# Patient Record
Sex: Female | Born: 1975 | Race: Black or African American | Hispanic: No | Marital: Single | Smoking: Never smoker
Health system: Southern US, Community
[De-identification: ages and names within clinical notes are randomized; demographics above are authoritative.]

## PROBLEM LIST (undated history)

## (undated) DIAGNOSIS — IMO0001 Reserved for inherently not codable concepts without codable children: Secondary | ICD-10-CM

## (undated) DIAGNOSIS — D649 Anemia, unspecified: Secondary | ICD-10-CM

## (undated) DIAGNOSIS — D573 Sickle-cell trait: Secondary | ICD-10-CM

## (undated) DIAGNOSIS — R0602 Shortness of breath: Secondary | ICD-10-CM

## (undated) DIAGNOSIS — I2699 Other pulmonary embolism without acute cor pulmonale: Secondary | ICD-10-CM

## (undated) DIAGNOSIS — E669 Obesity, unspecified: Secondary | ICD-10-CM

## (undated) DIAGNOSIS — I10 Essential (primary) hypertension: Secondary | ICD-10-CM

## (undated) DIAGNOSIS — N186 End stage renal disease: Secondary | ICD-10-CM

## (undated) DIAGNOSIS — Z5189 Encounter for other specified aftercare: Secondary | ICD-10-CM

## (undated) HISTORY — PX: OTHER SURGICAL HISTORY: SHX169

## (undated) HISTORY — DX: Other pulmonary embolism without acute cor pulmonale: I26.99

## (undated) HISTORY — PX: FINGER AMPUTATION: SHX636

---

## 2002-08-20 ENCOUNTER — Emergency Department (HOSPITAL_COMMUNITY): Admission: EM | Admit: 2002-08-20 | Discharge: 2002-08-20 | Payer: Self-pay | Admitting: Emergency Medicine

## 2004-08-13 ENCOUNTER — Emergency Department (HOSPITAL_COMMUNITY): Admission: EM | Admit: 2004-08-13 | Discharge: 2004-08-13 | Payer: Self-pay | Admitting: Emergency Medicine

## 2004-08-13 ENCOUNTER — Inpatient Hospital Stay (HOSPITAL_COMMUNITY): Admission: EM | Admit: 2004-08-13 | Discharge: 2004-08-18 | Payer: Self-pay | Admitting: Emergency Medicine

## 2004-08-14 ENCOUNTER — Encounter (INDEPENDENT_AMBULATORY_CARE_PROVIDER_SITE_OTHER): Payer: Self-pay | Admitting: Cardiology

## 2004-11-22 ENCOUNTER — Ambulatory Visit (HOSPITAL_COMMUNITY): Admission: RE | Admit: 2004-11-22 | Discharge: 2004-11-22 | Payer: Self-pay | Admitting: *Deleted

## 2004-12-02 ENCOUNTER — Emergency Department (HOSPITAL_COMMUNITY): Admission: EM | Admit: 2004-12-02 | Discharge: 2004-12-02 | Payer: Self-pay | Admitting: Emergency Medicine

## 2005-01-11 ENCOUNTER — Emergency Department (HOSPITAL_COMMUNITY): Admission: EM | Admit: 2005-01-11 | Discharge: 2005-01-11 | Payer: Self-pay | Admitting: Emergency Medicine

## 2005-01-29 ENCOUNTER — Ambulatory Visit (HOSPITAL_COMMUNITY): Admission: RE | Admit: 2005-01-29 | Discharge: 2005-01-29 | Payer: Self-pay | Admitting: Vascular Surgery

## 2005-05-17 ENCOUNTER — Emergency Department (HOSPITAL_COMMUNITY): Admission: EM | Admit: 2005-05-17 | Discharge: 2005-05-17 | Payer: Self-pay | Admitting: *Deleted

## 2005-05-20 ENCOUNTER — Emergency Department (HOSPITAL_COMMUNITY): Admission: EM | Admit: 2005-05-20 | Discharge: 2005-05-20 | Payer: Self-pay | Admitting: *Deleted

## 2005-11-28 ENCOUNTER — Ambulatory Visit (HOSPITAL_COMMUNITY): Admission: RE | Admit: 2005-11-28 | Discharge: 2005-11-28 | Payer: Self-pay | Admitting: Nephrology

## 2005-12-16 ENCOUNTER — Ambulatory Visit (HOSPITAL_COMMUNITY): Admission: RE | Admit: 2005-12-16 | Discharge: 2005-12-16 | Payer: Self-pay | Admitting: Nephrology

## 2006-02-19 ENCOUNTER — Inpatient Hospital Stay (HOSPITAL_COMMUNITY): Admission: AC | Admit: 2006-02-19 | Discharge: 2006-02-27 | Payer: Self-pay

## 2006-05-26 ENCOUNTER — Encounter: Admission: RE | Admit: 2006-05-26 | Discharge: 2006-05-26 | Payer: Self-pay | Admitting: Nephrology

## 2006-08-07 ENCOUNTER — Encounter: Admission: RE | Admit: 2006-08-07 | Discharge: 2006-08-07 | Payer: Self-pay | Admitting: Nephrology

## 2006-09-12 ENCOUNTER — Ambulatory Visit: Payer: Self-pay | Admitting: Vascular Surgery

## 2007-03-06 ENCOUNTER — Ambulatory Visit: Payer: Self-pay | Admitting: Vascular Surgery

## 2007-03-17 ENCOUNTER — Ambulatory Visit (HOSPITAL_COMMUNITY): Admission: RE | Admit: 2007-03-17 | Discharge: 2007-03-17 | Payer: Self-pay | Admitting: Nephrology

## 2007-03-26 ENCOUNTER — Ambulatory Visit: Payer: Self-pay | Admitting: *Deleted

## 2007-03-31 ENCOUNTER — Ambulatory Visit: Payer: Self-pay | Admitting: *Deleted

## 2007-03-31 ENCOUNTER — Ambulatory Visit (HOSPITAL_COMMUNITY): Admission: RE | Admit: 2007-03-31 | Discharge: 2007-03-31 | Payer: Self-pay | Admitting: *Deleted

## 2007-04-23 ENCOUNTER — Ambulatory Visit: Payer: Self-pay | Admitting: *Deleted

## 2007-04-27 ENCOUNTER — Ambulatory Visit: Payer: Self-pay | Admitting: Vascular Surgery

## 2007-05-20 ENCOUNTER — Ambulatory Visit (HOSPITAL_COMMUNITY): Admission: RE | Admit: 2007-05-20 | Discharge: 2007-05-21 | Payer: Self-pay | Admitting: *Deleted

## 2007-05-20 ENCOUNTER — Encounter (INDEPENDENT_AMBULATORY_CARE_PROVIDER_SITE_OTHER): Payer: Self-pay | Admitting: *Deleted

## 2007-05-20 ENCOUNTER — Ambulatory Visit: Payer: Self-pay | Admitting: *Deleted

## 2007-05-28 ENCOUNTER — Ambulatory Visit: Payer: Self-pay | Admitting: *Deleted

## 2007-06-04 ENCOUNTER — Ambulatory Visit: Payer: Self-pay | Admitting: *Deleted

## 2007-09-15 ENCOUNTER — Encounter: Admission: RE | Admit: 2007-09-15 | Discharge: 2007-09-15 | Payer: Self-pay | Admitting: Nephrology

## 2007-10-06 ENCOUNTER — Ambulatory Visit (HOSPITAL_COMMUNITY): Admission: RE | Admit: 2007-10-06 | Discharge: 2007-10-06 | Payer: Self-pay | Admitting: Nephrology

## 2007-11-26 ENCOUNTER — Ambulatory Visit (HOSPITAL_COMMUNITY): Admission: RE | Admit: 2007-11-26 | Discharge: 2007-11-26 | Payer: Self-pay | Admitting: Nephrology

## 2007-12-08 ENCOUNTER — Ambulatory Visit: Payer: Self-pay | Admitting: Vascular Surgery

## 2008-01-05 ENCOUNTER — Ambulatory Visit (HOSPITAL_COMMUNITY): Admission: RE | Admit: 2008-01-05 | Discharge: 2008-01-05 | Payer: Self-pay | Admitting: Vascular Surgery

## 2008-05-18 ENCOUNTER — Ambulatory Visit: Payer: Self-pay | Admitting: Vascular Surgery

## 2008-05-18 ENCOUNTER — Ambulatory Visit (HOSPITAL_COMMUNITY): Admission: RE | Admit: 2008-05-18 | Discharge: 2008-05-18 | Payer: Self-pay | Admitting: Vascular Surgery

## 2008-10-27 ENCOUNTER — Ambulatory Visit: Payer: Self-pay | Admitting: *Deleted

## 2008-11-10 ENCOUNTER — Ambulatory Visit: Payer: Self-pay | Admitting: Vascular Surgery

## 2008-11-10 ENCOUNTER — Ambulatory Visit (HOSPITAL_COMMUNITY): Admission: RE | Admit: 2008-11-10 | Discharge: 2008-11-10 | Payer: Self-pay | Admitting: Vascular Surgery

## 2008-12-08 ENCOUNTER — Ambulatory Visit: Payer: Self-pay | Admitting: Vascular Surgery

## 2008-12-08 ENCOUNTER — Ambulatory Visit (HOSPITAL_COMMUNITY): Admission: RE | Admit: 2008-12-08 | Discharge: 2008-12-08 | Payer: Self-pay | Admitting: Vascular Surgery

## 2008-12-15 ENCOUNTER — Encounter: Admission: RE | Admit: 2008-12-15 | Discharge: 2008-12-15 | Payer: Self-pay | Admitting: Nephrology

## 2009-01-03 ENCOUNTER — Ambulatory Visit (HOSPITAL_COMMUNITY): Admission: RE | Admit: 2009-01-03 | Discharge: 2009-01-03 | Payer: Self-pay | Admitting: Nephrology

## 2009-02-23 ENCOUNTER — Ambulatory Visit (HOSPITAL_COMMUNITY): Admission: RE | Admit: 2009-02-23 | Discharge: 2009-02-23 | Payer: Self-pay | Admitting: Nephrology

## 2009-06-13 ENCOUNTER — Ambulatory Visit (HOSPITAL_COMMUNITY): Admission: RE | Admit: 2009-06-13 | Discharge: 2009-06-13 | Payer: Self-pay | Admitting: Nephrology

## 2009-12-25 ENCOUNTER — Ambulatory Visit: Payer: Self-pay | Admitting: Surgery

## 2010-03-05 ENCOUNTER — Ambulatory Visit (HOSPITAL_COMMUNITY): Admission: AD | Admit: 2010-03-05 | Discharge: 2010-03-05 | Payer: Self-pay | Admitting: Vascular Surgery

## 2010-03-05 ENCOUNTER — Ambulatory Visit: Payer: Self-pay | Admitting: Vascular Surgery

## 2010-03-12 ENCOUNTER — Ambulatory Visit: Payer: Self-pay | Admitting: Surgery

## 2010-04-10 ENCOUNTER — Ambulatory Visit (HOSPITAL_COMMUNITY)
Admission: RE | Admit: 2010-04-10 | Discharge: 2010-04-10 | Payer: Self-pay | Source: Home / Self Care | Admitting: Nephrology

## 2010-05-03 ENCOUNTER — Ambulatory Visit (HOSPITAL_COMMUNITY)
Admission: RE | Admit: 2010-05-03 | Discharge: 2010-05-03 | Payer: Self-pay | Source: Home / Self Care | Attending: Surgery | Admitting: Surgery

## 2010-05-07 ENCOUNTER — Ambulatory Visit (HOSPITAL_COMMUNITY)
Admission: AD | Admit: 2010-05-07 | Discharge: 2010-05-07 | Payer: Self-pay | Source: Home / Self Care | Attending: Surgery | Admitting: Surgery

## 2010-05-27 ENCOUNTER — Encounter: Payer: Self-pay | Admitting: Nephrology

## 2010-05-28 ENCOUNTER — Encounter: Payer: Self-pay | Admitting: Nephrology

## 2010-06-04 ENCOUNTER — Ambulatory Visit
Admission: RE | Admit: 2010-06-04 | Discharge: 2010-06-04 | Payer: Self-pay | Source: Home / Self Care | Attending: Surgery | Admitting: Surgery

## 2010-06-05 NOTE — Assessment & Plan Note (Signed)
OFFICE VISIT  Kristine Rios, Kristine Rios DOB:  [birth year]                                       06/04/2010 CHART#:[id number]  The patient comes back in today for followup.  She is status post left forearm AV Gore-Tex graft.  I had to perform patch angioplasty of a portion of her left brachial artery with bovine pericardium as I took out her AV fistula and used the arteriotomy site for the inflow to the graft.  I felt that the arteriotomy was too large and it needed to be closed down so this was done with a bovine patch.  She presented several days later with significant swelling in the arm.  I met her in the holding area thinking that I was going to have to remove the graft, that it had gotten infected, but rather this looked to me to be more like a Gore-Tex reaction.  Therefore I sent her home.  She has done very well. The swelling has gone away and her arm feels normal now.  I think at this time she can proceed with cannulation of her graft.    Jorge Ny, MD Electronically Signed  VWB/MEDQ  D:  06/04/2010  T:  06/05/2010  Job:  [id number]  cc:   Dr Darrick Penna

## 2010-06-15 ENCOUNTER — Encounter (HOSPITAL_COMMUNITY): Payer: Medicare Other | Attending: Vascular Surgery

## 2010-06-15 DIAGNOSIS — Z4901 Encounter for fitting and adjustment of extracorporeal dialysis catheter: Secondary | ICD-10-CM | POA: Insufficient documentation

## 2010-06-15 DIAGNOSIS — I120 Hypertensive chronic kidney disease with stage 5 chronic kidney disease or end stage renal disease: Secondary | ICD-10-CM

## 2010-06-15 DIAGNOSIS — Z452 Encounter for adjustment and management of vascular access device: Secondary | ICD-10-CM

## 2010-06-15 DIAGNOSIS — N186 End stage renal disease: Secondary | ICD-10-CM

## 2010-07-16 LAB — POCT I-STAT 4, (NA,K, GLUC, HGB,HCT)
HCT: 29 % — ABNORMAL LOW (ref 36.0–46.0)
Hemoglobin: 9.9 g/dL — ABNORMAL LOW (ref 12.0–15.0)
Potassium: 4.6 mEq/L (ref 3.5–5.1)
Potassium: 5.4 mEq/L — ABNORMAL HIGH (ref 3.5–5.1)
Sodium: 135 mEq/L (ref 135–145)

## 2010-07-16 LAB — SURGICAL PCR SCREEN: Staphylococcus aureus: NEGATIVE

## 2010-07-18 LAB — SURGICAL PCR SCREEN
MRSA, PCR: NEGATIVE
Staphylococcus aureus: NEGATIVE

## 2010-07-18 LAB — POCT I-STAT 4, (NA,K, GLUC, HGB,HCT)
Glucose, Bld: 96 mg/dL (ref 70–99)
HCT: 35 % — ABNORMAL LOW (ref 36.0–46.0)
Hemoglobin: 11.9 g/dL — ABNORMAL LOW (ref 12.0–15.0)

## 2010-07-19 ENCOUNTER — Other Ambulatory Visit (HOSPITAL_COMMUNITY): Payer: Self-pay | Admitting: Nephrology

## 2010-07-19 DIAGNOSIS — N186 End stage renal disease: Secondary | ICD-10-CM

## 2010-08-01 ENCOUNTER — Ambulatory Visit (HOSPITAL_COMMUNITY): Payer: Medicare Other | Attending: Nephrology

## 2010-08-09 LAB — CROSSMATCH
ABO/RH(D): O POS
Antibody Screen: NEGATIVE

## 2010-08-11 LAB — CROSSMATCH

## 2010-08-12 LAB — POCT I-STAT 4, (NA,K, GLUC, HGB,HCT): Potassium: 4.7 mEq/L (ref 3.5–5.1)

## 2010-09-18 NOTE — Op Note (Signed)
Kristine Rios, Kristine Rios                  ACCOUNT NO.:  [id number]   MEDICAL RECORD NO.:  [id number]          PATIENT TYPE:  AMB   LOCATION:  SDS                          FACILITY:  [facility]   PHYSICIAN:  Janetta Hora. Fields, MD  DATE OF BIRTH:  02/20/1976   DATE OF PROCEDURE:  03/31/2007  DATE OF DISCHARGE:                               OPERATIVE REPORT   PROCEDURE:  1. Ligation of left upper extremity arteriovenous fistula.  2. Ultrasound of neck.  3. Insertion of Diatek catheter.   PREOPERATIVE DIAGNOSES:  1. 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  2. End-stage renal disease.   POSTOPERATIVE DIAGNOSES:  1. 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  2. End-stage renal disease.   ANESTHESIA:  General.   ASSISTANT:  Billee Cashing, RNFA   OPERATIVE FINDINGS:  1. Large degenerated aneurysmal fistula.  2. 23 cm Diatek catheter, right internal jugular vein.   OPERATIVE DETAIL:  After obtaining informed consent, the patient taken  to the operating room.  The patient placed supine position on the  operating table.  After induction of general anesthesia and endotracheal  intubation, the patient's neck was inspected with an ultrasound.  Real  time ultrasound and duplex was used to identify the right internal  jugular vein.  It had normal compressibility and respiratory variation.  Next the patient's entire neck and chest were prepped and draped in  usual sterile fashion.  Local anesthesia was infiltrated over the right  internal jugular vein.  Then a small finder needle was used to localize  right internal jugular vein and a larger introducer needle used to  cannulate the right internal jugular vein and a 0.035 J-tip guide wire  threaded through the right internal jugular vein down into the right  atrium under fluoroscopic guidance.  Next sequential 12, 14 and 16-  [place] dilators with peel-away sheath placed over the guidewire in the  right atrium.   Guidewire and dilator was removed.  The 23-cm Diatek  catheter was then placed through the peel-away sheath in the right  atrium.  The peel-away sheath was then removed, the catheter tunneled  subcutaneously, cut to length and hub attached.  Catheter was inspected  under fluoroscopy, found with its tip to be in the right atrium, no  kinks throughout its course.  Catheters noted to flush and draw easily.  Catheter was sutured to skin with nylon sutures.  The neck insertion  site was closed with Vicryl stitch.  The patient tolerated this portion  of the procedure well and there were no complications.  Catheter was  loaded with concentrated heparin solution.   Next attention was turned to the patient's left upper extremity.  Entire  left upper extremity prepped and draped usual sterile fashion.  A  transverse incision was made through a preexisting scar near the  antecubital crease.  Incision carried down through subcutaneous tissues  down to level fistula.  Fistula was dissected free circumferentially.  Dissection was carried down to level of the arteriovenous anastomosis.  A Cooley clamp was then placed just adjacent to the arteriovenous  anastomosis.  Fistula was clamped distally with fistula clamp.  There  was a very large cuff of vein and this was oversewn with a running 5-0  Prolene suture to leave a small cuff on the artery.  The Cooley clamp  was then removed.  Hemostasis was obtained.  The distal end of the  fistula was then also oversewn with a running 5-0 Prolene suture.  The  clamp was then removed.  Everything was hemostatic.  The subcutaneous  tissues reapproximated using running 3-0 Vicryl suture.  Skin was closed  with 4-0 Vicryl subcuticular stitch.  The patient tolerate procedure  well and there were no complications.  Instrument, sponge, needle counts  correct at end of the case.  The patient taken to recovery room in  stable condition.  The patient had palpable radial  pulse at the end of  the case.      Janetta Hora. Fields, MD  Electronically Signed     CEF/MEDQ  D:  03/31/2007  T:  03/31/2007  Job:  [id number]
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
## 2010-09-18 NOTE — Op Note (Signed)
NAMEJASHAE, WIGGS                  ACCOUNT NO.:  [id number]   MEDICAL RECORD NO.:  [id number]          PATIENT TYPE:  AMB   LOCATION:  SDS                          FACILITY:  [facility]   PHYSICIAN:  Quita Skye. Hart Rochester, M.D.  DATE OF BIRTH:  1975-10-01   DATE OF PROCEDURE:  11/10/2008  DATE OF DISCHARGE:                               OPERATIVE REPORT   PREOPERATIVE DIAGNOSIS:  Poorly functioning right upper arm  arteriovenous fistula secondary to competing branches.   POSTOPERATIVE DIAGNOSIS:  Poorly functioning right upper arm  arteriovenous fistula secondary to competing branches.   OPERATION:  Ligation of competing branches, right upper arm AV fistula.   SURGEON:  Quita Skye. Hart Rochester, MD   FIRST ASSISTANT:  Nurse.   ANESTHESIA:  Local.   PROCEDURE:  The patient was taken to the operating room and placed in  supine position, at which time, the right upper extremity was prepped  with Betadine soap and solution, draped in routine sterile manner.  The  competing branches were in the midportion of the right upper arm and had  been marked after documenting this with the ultrasound in the OR prior  to prepping.  After infiltration with 1% Xylocaine with epinephrine, 2  short longitudinal incisions were made, 1 in the mid upper arm and 1 in  the more proximal upper arm over these prominent competing branches.  These were dissected free down to their entrance from the fistula and  ligated with 3-0 silk ties and divided.  Vein was examined beneath the  skin proximally and distally.  No other large branches were noted.  The  wounds were closed in layers with Vicryl in subcuticular fashion.  Sterile dressing applied.  The patient taken to recovery room in  satisfactory condition.      Quita Skye Hart Rochester, M.D.  Electronically Signed     JDL/MEDQ  D:  11/10/2008  T:  11/10/2008  Job:  [id number]

## 2010-09-18 NOTE — Assessment & Plan Note (Signed)
OFFICE VISIT   Kristine Rios, Kristine Rios  DOB:  09/06/1975                                       04/23/2007  CHART#:[id number]   The patient has had an extensively hypertrophied left upper arm AV  fistula ligated by Dr. Darrick Penna, along with placement of a Diatek catheter  on March 31, 2007.   At today's postoperative visit, there are no apparent complications.  Vital signs are stable.  Incision is well-healed in her left arm.   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   She would like to have these resected, along with placement of new  access.  I have arranged for surgery to be performed by myself on  05/20/2007 with dialysis scheduled for 05/19/2007.  We will plan to  resect left arm pseudo-aneurysms and potential right arm AV fistula  creation.  She will be scanned within 7 days of her procedure for vein  mapping.   Balinda Quails, M.D.  Electronically Signed   PGH/MEDQ  D:  04/23/2007  T:  04/24/2007  Job:  [id number]

## 2010-09-18 NOTE — Assessment & Plan Note (Signed)
OFFICE VISIT   Kristine Rios, Kristine Rios  DOB:  1975/06/30                                       12/25/2009  CHART#:[id number]   I am seeing Kristine Rios today at the request of Dr. Eliott Rios for evaluation  of her right upper arm fistula.  The patient has undergone a fistulogram  in February which shows extremely tortuous vein with luminal narrowing  near the cephalic subclavian junction.  She has not had any significant  change of flow rates at dialysis but they do have sometimes difficulty  running at her full speed.  She comes in today to discuss options for  repair or revision.   PHYSICAL EXAMINATION:  VITAL SIGNS:  Heart rate 80 blood pressure  105/76, respiratory rate 16.  She is well-appearing, in no distress.  Respirations are nonlabored.  The fistula is easily palpable in the  upper arm.   ASSESSMENT:  Poorly functioning right upper arm dialysis graft.   PLAN:  I think the patient's fistula is extremely tortuous with luminal  narrowing near the subclavian cephalic vein junction.  Due to the  location of the stenosis, I do not think it is able to be revised  surgically.  I would, however, recommend repeat fistulogram to see if  this area could be dilated with balloon venoplasty.  I will plan on  doing this next Tuesday, August 30 and I will plan on accessing her  fistula as high up on her arm as possible.  I discussed that this may or  may not be amenable to percutaneous intervention.  If I am unable to  successfully dilate this fistula, then it would become a fistula that is  not revisable.  I would use it until it can no longer be utilized and  the she will need to be converted to a graft.     Jorge Ny, MD  Electronically Signed   VWB/MEDQ  D:  12/25/2009  T:  12/26/2009  Job:  [id number]

## 2010-09-18 NOTE — Assessment & Plan Note (Signed)
OFFICE VISIT   SIAN, ROCKERS  DOB:  April 15, 1976                                       03/26/2007  OZHYQ#:[id number]   Nao is a 35 year old end-stage renal failure patient dialyzed at the  [facility] Monday, Wednesday and Friday.  She has a  history of hypertension leading to end-stage renal failure.   She has a left brachiocephalic arteriovenous fistula placed in September  of 2006. This has become extremely large and hypertrophied. No bleeding  associated with this. Denies significant pain. There is concern over how  large this has become, however, and recently this infiltrated.   PAST HISTORY:  Is significant for hypertension. She takes Norvasc and  Lisinopril for control of this. She remains poorly controlled.   35 year old African-American female in no acute distress.  Alert and  oriented. BP 175/116. Pulse 125 per minute. Respirations 18 per minute.   Left upper arm does reveal a large hypertrophied AV fistula with  evidence of some thinning of the skin. The fistula is patent. Recent  area of infiltration evident.   The best option at this time appears to be ligation of the fistula as  this has become quite large and hypertrophied.  Plan to perform this on  03/31/2007, at [facility] Place a Ditech catheter at that time  for temporary access.   Balinda Quails, M.D.  Electronically Signed   PGH/MEDQ  D:  03/26/2007  T:  03/27/2007  Job:  [id number]

## 2010-09-18 NOTE — Op Note (Signed)
Kristine Rios, Kristine Rios                  ACCOUNT NO.:  [id number]   MEDICAL RECORD NO.:  [id number]          PATIENT TYPE:  INP   LOCATION:  2550                         FACILITY:  [facility]   PHYSICIAN:  Balinda Quails, M.D.    DATE OF BIRTH:  08/08/1975   DATE OF PROCEDURE:  05/20/2007  DATE OF DISCHARGE:                               OPERATIVE REPORT   SURGEON:  Balinda Quails, MD   ASSISTANT:  Jerold Coombe, P.A.   ANESTHETIC:  General endotracheal.   ANESTHESIOLOGIST:  Kaylyn Layer. Michelle Piper, MD   PREOPERATIVE DIAGNOSES:  1. Thrombosed, painful left upper arm arteriovenous fistula with      multiple false aneurysms.  2. End-stage renal failure.   POSTOPERATIVE DIAGNOSES:  1. Thrombosed, painful left upper arm arteriovenous fistula with      multiple false aneurysms.  2. End-stage renal failure.   PROCEDURES:  1. Resection of multiple thrombosed false aneurysms, left upper arm.  2. Right brachiocephalic arteriovenous fistula [place] [employer]).   CLINICAL NOTE:  Kristine Rios is a 35 year old female with a history of  known end-stage renal disease.  She developed multiple false aneurysms  and a left brachiocephalic arteriovenous fistula requiring ligation and  has had a Diatek catheter placed.  She has significant symptoms in her  left arm of pain due to these thrombosed false aneurysms.  Brought to  the operating room at this time for planned resection of multiple false  aneurysms in the left upper arm arteriovenous fistula.  Also the plan at  this time is to create a right brachiocephalic arteriovenous fistula.   OPERATIVE PROCEDURE:  The patient brought to the operating room in  stable condition.  Placed in supine position.  General endotracheal  anesthesia induced.  The left arm prepped and draped in a sterile  fashion.   Three separate elliptical skin incisions were made encompassing three  distinct areas of thrombosed false aneurysm in the left upper arm.  Subcutaneous tissue divided  and the false aneurysms were resected from  the surrounding subcutaneous tissue.  This was carried out using a  combination of blunt and sharp dissection.  The majority the procedure  could be carried out bluntly, pushing the soft tissues off of the false  aneurysm.  These were due to a thrombosed, hypertrophied AV fistula.  These three segments of pseudoaneurysm were then all resected and  communicated with one another subcutaneously.  The resection was carried  up to the axillary fold and the vein divided at this level, where it was  chronically thrombosed.  Care was assured not to push any thrombus  proximally.   The patient continued to have a 2+ left radial pulse throughout the  operative procedure.  The subcutaneous tissue was then drained with a 19  round Blake drain, exited distally through the forearm and fixed to the  skin with a 2-0 silk suture.  The deep subcutaneous tissue was then  closed with interrupted 2-0 Vicryl suture.  The superficial subcutaneous  tissue closed with a running 3-0 Vicryl suture.  Skin closed with  interrupted 3-0  nylon in a simple stitch.  Sterile dressings of 4x4,  Kerlix and Ace wrap applied to the left arm.   Attention then placed on the right arm.  This was prepped and draped in  a sterile fashion.  Skin and subcutaneous tissue instilled with 1%  Xylocaine in the right antecubital fossa.  Transverse skin incision made  through the right antecubital fossa.  The right cephalic vein was  identified.  This was approximately 4 mm in size.  This was freed and  ligated distally with 3-0 silk and divided and easily dilated to 3 mm  without trauma.  Flushed with heparin-saline solution and controlled  with a bulldog clamp.  Deep dissection then carried down to expose the  brachial artery in the antecubital fossa.  This was dissected out, 4-5  mm in size.  Encircled with a vessel loop.  The patient administered  2000 units of heparin intravenously.    Brachial artery controlled proximally and distally with bulldog clamps.  A longitudinal arteriotomy made.  The vein was then anastomosed end-to-  side to the brachial artery using running 7-0 Prolene suture.  The vein  and artery flushed.  Clamps were removed.  Excellent flow present  through the fistula.  No apparent complications.  Adequate hemostasis  obtained.   Subcutaneous tissue closed with running 3-0 Vicryl suture.  Skin closed  with 4-0 Monocryl.  Dermabond applied.  No apparent complications.  The  patient transferred to the recovery room in stable condition.      Balinda Quails, M.D.  Electronically Signed     PGH/MEDQ  D:  05/20/2007  T:  05/20/2007  Job:  [id number]

## 2010-09-18 NOTE — Assessment & Plan Note (Signed)
OFFICE VISIT   Kristine Rios, Kristine Rios  DOB:  1976-04-10                                       10/27/2008  ZOXWR#:[id number]   The patient is an end-stage renal failure patient hemodialysis Monday,  Wednesday, Friday.  She has a history of a right upper arm arteriovenous  fistula, brachial cephalic created by myself 05/20/2007.  She was most  recently seen by Dr. Edilia Bo in the office for evaluation of the fistula  and he had planned to carry out a ligation of two large competing  branches which were evident on a fistulogram dated 11/26/2007.  This  surgery was scheduled for 12/24/2007, however, the patient never kept  her appointment.  She returns at this time now wishing to have this  revision carried out.   On evaluation she is an obese 35 year old African American female.  Blood pressure 108/72, pulse is 86.  Right arm reveals a patent brachial  cephalic AV fistula.  The competing veins are fairly obvious on the  surface of the skin.   I have arranged for ligation of competing branches to be carried out by  Dr. Edilia Bo on 11/03/2008 at [facility].   Balinda Quails, M.D.  Electronically Signed   PGH/MEDQ  D:  10/27/2008  T:  10/28/2008  Job:  [id number]

## 2010-09-18 NOTE — Procedures (Signed)
CEPHALIC VEIN MAPPING   INDICATION:  ESRD.   HISTORY:  Failed left upper extremity AVF.   EXAM:  Right upper extremity cephalic vein map.   The right cephalic vein is compressible.   Diameter measurements range from 0.20 cm to 0.22 cm on the forearm and  in the upper arm from 0.25 cm to 0.43 cm.   The left cephalic vein was not evaluated.   See attached worksheet for all measurements.   IMPRESSION:  Patent right cephalic vein which is of acceptable diameter  for use as a dialysis access site in the upper arm and appearing of  smaller diameter in the forearm.   ___________________________________________  P. Liliane Bade, M.D.   AS/MEDQ  D:  04/27/2007  T:  04/28/2007  Job:  [id number]

## 2010-09-18 NOTE — Assessment & Plan Note (Signed)
OFFICE VISIT   KEYON, WINNICK  DOB:  29-Nov-1975                                       12/08/2007  CHART#:[id number]   I saw the patient for evaluation of her right upper arm AV fistula.  She  had the right upper arm AV fistula placed earlier this year and it has  been difficult to access.  Therefore she currently has a functioning  Diatek catheter.  She underwent a fistulogram on 07/23 2009, which  showed multiple competing branches in her fistula and no options from an  endovascular standpoint.  She was sent for vascular consultation.   I have reviewed the films and I see no problems with the arterial  anastomosis.  The fistula appears to be anastomosed to the brachial  artery just below the antecubital level.  There is a slight kink near  the proximal anastomosis but this does not appear to be limiting flow.  Beyond this, the vein becomes somewhat tortuous.  In the mid segment of  the fistula there are 2 very large competing branches that supply  multiple collaterals.  The fistula above this is patent and the cephalic  vein is patent where it enters the subclavian vein with no significant  narrowing noted.   EXAMINATION:  This is a pleasant 35 year old woman who appears her  stated age.  The fistula has a good thrill proximally.  Multiple venous  collaterals are noted.   I think the best chance for salvage of the fistula would be to ligate  the two large competing branches.  I explained that there is no  guarantee that this will solve the problem.  However, I think it is  worth 1 last try to salvage this fistula before going to placement of an  AV graft.  Her surgery has been scheduled for 12/24/2007.   Di Kindle. Edilia Bo, M.D.  Electronically Signed   CSD/MEDQ  D:  12/08/2007  T:  12/09/2007  Job:  [id number]

## 2010-09-18 NOTE — Assessment & Plan Note (Signed)
OFFICE VISIT   BIANCE, MONCRIEF  DOB:  [birth year]                                       03/06/2007  EAVWU#:[id number]   Agnes Brightbill presents today have been sent over from the kidney center.  She is a 35 year old black female with end-stage renal disease.  She has  a left upper arm AV fistula that was created by Dr. Cari Caraway 2  years ago in September 2006.  She has had massive dilatation of this  over the 2 years subsequent.  She has had good functioning of this.  She  was seen by Dr. Fabienne Bruns in our office in May 2008 for evaluation  of the large aneurysmal dilatation.  She did have a shuntogram of this  in 11/2005, and I have reviewed these films.  She has had multiple  attempts of rescheduling a shuntogram, and she has not made these  appointments.  She presented today with concern regarding her access.  The dialysis staff had been instructed to stick above the area where  they were typically sticking the fistula, and they had a return of old  blood.   PHYSICAL EXAMINATION:  She has some massive enlargement of her cephalic  vein occupying her entire left lateral upper arm.  This is patent.  There is no erythema.  No skin breakdown.  No evidence of impending  rupture.  There is no evidence of infection.  The dialysis staff had  accessed this above the usual area, in the mid to upper arm.  I removed  the dressing from this and there is a clean stick.  She had had an old  hematoma in that area, and I suspect that what the access center had  done was access the hematoma with old clotted blood present.  I  explained to Ms. Angello the importance of obtaining a shuntogram.  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  This has been scheduled for  next week, and she was encouraged to keep this.  I feel it is totally  safe to continue to access the fistula  during this evaluation time.  If she has uncorrectable upper arm central  pathology with stenosis, then she require a new graft and a right arm  access.   Larina Earthly, M.D.  Electronically Signed   TFE/MEDQ  D:  03/06/2007  T:  03/09/2007  Job:  [id number]   cc:   Fayrene Fearing L. Deterding, M.D.

## 2010-09-18 NOTE — Assessment & Plan Note (Signed)
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   She presents today for postoperative scheduled visit.  Her pulse is 90  per minute, respirations 18 per minute.  O2 saturation is 97%.  Incisions in the left arm are healing well.  The drain is still in  place.  This will be removed today.  She has 2+ left radial pulse.   Her right Carlos American fistula is patent with an easily palpable thrill.   We will plan to have sutures removed next week in the office.  Drain  will be removed today.  Overall, doing well.   Balinda Quails, M.D.  Electronically Signed   PGH/MEDQ  D:  05/28/2007  T:  05/29/2007  Job:  [id number]

## 2010-09-21 NOTE — Discharge Summary (Signed)
Kristine Rios, Kristine Rios                  ACCOUNT NO.:  [id number]   MEDICAL RECORD NO.:  [id number]          PATIENT TYPE:  INP   LOCATION:  5506                         FACILITY:  [facility]   PHYSICIAN:  Franchot Mimes, MD      DATE OF BIRTH:  May 06, 1976   DATE OF ADMISSION:  08/13/2004  DATE OF DISCHARGE:  08/18/2004                                 DISCHARGE SUMMARY   DISCHARGE DIAGNOSES:  1.  End-stage renal disease of unknown etiology.  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  1.  Anemia secondary to end-stage renal disease.  2.  Hyperparathyroidism secondary to end-stage renal disease.  3.  Hypocalcemia and hyperphosphatemia, now resolved.   DISCHARGE MEDICATIONS:  1.  Nephro-Vite one daily.  2.  OS-Cal 1000 mg p.o. with each meal.  3.  Epogen 15,000 units IV with each dialysis.  4.  Zemplar 6 mcg IV with each dialysis.  5.  Percocet 1-2 tablets every six hours as needed for pain, #15 provided.   DISCHARGE INSTRUCTIONS:  The patient is instructed to follow an appropriate  low sodium/low potassium renal diet.   FOLLOWUP:  She is to continue her dialysis on Monday, Wednesday and Friday  at 11:45 a.m. at the [facility].   PROCEDURES:  1.  Insertion of Diatek catheter performed by CVTS on August 14, 2004.  2.  Left AV fistula performed by CVTS on August 17, 2004.  3.  Echocardiogram done on August 14, 2004.  Results:  Left ventricle is mildly dilated, ejection fraction is estimated  at 50-55%, left ventricular wall thickness is moderately increased, there is  mild mitral valvular regurgitation, the left atrium is moderately dilated,  and there is a very minimal pericardial effusion without hemodynamic  compromise.  1.  Renal ultrasound performed August 13, 2004.  Results:  Echogenic kidneys which are small for the patients age, there  is a  hypoechoic lesion representing a complex cyst in the lower pole of the left  kidney, this is most suspicious for medical renal disease potentially  chronic.   HISTORY OF PRESENT ILLNESS:  Kristine Rios is a 35 year old African-American  female with a family history of kidney disease in two aunts who have been on  dialysis.  She reported to the [facility] Emergency Room with complaints of  edema.  She was found to have a creatinine of 23.  It turns out she had also  been having daily nausea, anorexia, fatigue, as well as this edema that had  been going on for some time now.  She had no other significant past medical  history.   PHYSICAL EXAMINATION:  GENERAL:  She is an obese African-American female in  no apparent distress.  She was awake, alert and oriented x3 and her mood and  affect are appropriate.  HEENT:  Normal.  CHEST:  No crackles, normal effort.  CARDIOVASCULAR:  Regular rate, grade 2/6 systolic murmur, there was question  of a pericardial rub, however as her heart rate slowed down this was not the  case.  ABDOMEN:  Soft, nontender, nondistended, no organomegaly, no masses.  EXTREMITIES:  +4 pitting edema to the level above the sacrum, there is no  skin breakdown.  NEUROLOGIC:  Nonfocal.   HOSPITAL COURSE:  1.  End-stage renal disease:  Given the results of the renal ultrasound, it      became apparent that this was due to chronic kidney disease that had      been going on for some time and the patient was in need of dialysis      soon, and as dialysis was initiated, after insertion of the Diatek      catheter.  The patient underwent four series of dialysis as an      inpatient.  She tolerated dialysis well.  Her weight at the end of her      last dialysis was 132.7, and they were able to take 5000 liters out.      Her blood pressure at the end of this dialysis was 123/88.  2.  Anemia:  The patients initial hemoglobin was 7.1, she then became lower      at 7.5, was  given two units, started on Epogen.  She responded well to      these two units and her hemoglobin was actually going up.  By the time      she left the decision was made to not give additional packed red blood      cells, her discharge hemoglobin was 9.1.  3.  Hyperparathyroidism:  As stated her PTH was found to be 1640 and she had      very low calcium and high phosphorus.  She was started on Zemplar, this      will need continued monitoring at least every three months and she may      be a candidate for a thyroidectomy.  4.  Hypertension:  The patient came in very hypertensive, this continued but      improved throughout her hospital course.  As stated above, at the end of      her last dialysis her blood pressure was 123/88, she has significant      edema that remains and will need a fair amount more fluid taken off and      I believe her hypertension will correct as her estimated dry weight is      achieved.  5.  Hypocalcemia and hyperphosphatemia:  The patients phosphate was      initially 10.7, it was 3.6 at time of discharge, as this corrected with      dialysis.  She is now on calcium.  Her calcium at the time of discharge      was 7.2.   ADDITIONAL LAB DATA:  Her most pertinent labs from discharge are as follows:  PTH 1640, iron percent sats 44, hepatitis B surface antigen negative on  August 13, 2004, white blood cell count 8, hemoglobin 9.1, platelets 139,  renal profile shows a sodium of 140, potassium 3.0, chloride 107, bicarb 25,  BUN 24, creatinine 11.3, glucose 92, calcium 7.2, phosphorus 3.6.   Additional labs performed in the hospital reveal a negative acute hepatitis  panel, negative ANA, a CD4 count of 620, sed rate of 88, B12 level greater  than 2000, TSH 5.2, ASOs were negative, folate of 3.8, serum protein  electrophoresis revealed nonspecific diffuse polyclonal increase in gamma  globulins.  SUGGESTED FOLLOWUP ITEMS:  The patient despite her end-stage renal  disease  and obesity is otherwise in fairly good health.  She may be a good candidate  for kidney transplant in the future if she is able to tolerate dialysis and  do well with her chronic disease.      TV/MEDQ  D:  08/18/2004  T:  08/18/2004  Job:  [id number]   cc:   [facility]   [facility]

## 2010-09-21 NOTE — Op Note (Signed)
NAMEZAELYN, NOACK                  ACCOUNT NO.:  [id number]   MEDICAL RECORD NO.:  [id number]          PATIENT TYPE:  AMB   LOCATION:  SDS                          FACILITY:  [facility]   PHYSICIAN:  Di Kindle. Edilia Bo, M.D.DATE OF BIRTH:  05-13-1975   DATE OF PROCEDURE:  01/29/2005  DATE OF DISCHARGE:  01/29/2005                                 OPERATIVE REPORT   PREOPERATIVE DIAGNOSIS:  Chronic renal failure.   POSTOPERATIVE DIAGNOSIS:  Chronic renal failure.   PROCEDURE:  Placement of new left upper arm arteriovenous fistula.   SURGEON:  Di Kindle. Edilia Bo, M.D.   ASSISTANT:  Nurse.   ANESTHESIA:  Local with sedation.   TECHNIQUE:  The patient was taken to the operating room and sedated by  Anesthesia.  The left upper extremity was prepped and draped in the usual  sterile fashion.  After the skin was infiltrated with 1% lidocaine, a  transverse incision was made just above the antecubital level and here the  cephalic vein was dissected free.  It was a reasonable-sized vein.  One  lateral branch was ligated between 2-0 silk ties.  The brachial artery was  dissected free beneath the fascia; it did spasm down some, but was a good  reasonable-sized artery and had a good pulse.  The patient was heparinized.  The vein had been distended up with heparinized saline and easily took a 4-  mm dilator.  The artery was clamped proximally and distally and a  longitudinal arteriotomy was made.  The vein was mobilized over and sewn end-  to-side to the artery using continuous 6-0 Prolene suture.  At the  completion, there was an excellent thrill in the fistula.  There was a  palpable radial pulse.  Hemostasis was obtained in the wound and the wound  was closed with a deep layer of 3-0 Vicryl and the skin closed with 4-0  Vicryl.  A sterile dressing was applied.  The patient tolerated the  procedure well and was transferred to the recovery room in satisfactory  condition.  All needle and  sponge counts were correct.      Di Kindle. Edilia Bo, M.D.  Electronically Signed     CSD/MEDQ  D:  01/29/2005  T:  01/30/2005  Job:  [id number]

## 2010-09-21 NOTE — Op Note (Signed)
[name], Kristine Rios                  ACCOUNT NO.:  [id number]   MEDICAL RECORD NO.:  [id number]          PATIENT TYPE:  INP   LOCATION:  5506                         FACILITY:  [facility]   PHYSICIAN:  Balinda Quails, M.D.    DATE OF BIRTH:  1975-08-24   DATE OF PROCEDURE:  08/17/2004  DATE OF DISCHARGE:                                 OPERATIVE REPORT   PREOPERATIVE DIAGNOSIS:  End stage renal failure.   POSTOPERATIVE DIAGNOSIS:  End stage renal failure.   OPERATION PERFORMED:  Left Cimino arteriovenous fistula.   SURGEON:  Balinda Quails, M.D.   ASSISTANT:  Nurse.   ANESTHESIA:  Local with MAC.   ANESTHESIOLOGISTJean Rosenthal.   DESCRIPTION OF PROCEDURE:  The patient was brought to the operating room in  stable condition.  Placed in supine position.  Left arm prepped and draped  in sterile fashion.  Skin and subcutaneous tissue instilled with 1%  Xylocaine with epinephrine.  Longitudinal skin incision made over the left  anatomical snuff box.  Carried down to expose the cephalic vein.  This was  freed.  3 to 4 mm in size.  The vein encircled with a vessel loop.  Tributaries ligated with 4-0 silk and divided.  The vein then ligated  distally with 3-0 silk and divided.  Dilated with papaverine.  This was 3 to  4 mm in size.  Deep dissection then carried down to the radial artery.  Tributaries of the radial artery controlled with 4-0 silk and divided.  The  radial artery adequately mobilized.  The patient administered 3000 units of  heparin intravenously.  The artery then controlled with serrefine clamps.  Longitudinal arteriotomy was made.  The vein was divided and anastomosis end-  to-side to the radial artery using running 7-0 Prolene suture.  Clamps were  then removed.  Excellent flow present through the vein.  Adequate hemostasis  obtained.  Sponge and instrument counts were correct.  Subcutaneous tissue  closed with running 3-0 Vicryl suture.  Skin closed with 4-0 Monocryl.  Steri-Strips applied.  There were no apparent complications.  The patient  transferred to recovery room in stable condition.      PGH/MEDQ  D:  08/17/2004  T:  08/17/2004  Job:  [id number]

## 2010-09-21 NOTE — Op Note (Signed)
Kristine Rios, Kristine Rios                  ACCOUNT NO.:  [id number]   MEDICAL RECORD NO.:  [id number]          PATIENT TYPE:  OIB   LOCATION:  2899                         FACILITY:  [facility]   PHYSICIAN:  Balinda Quails, M.D.    DATE OF BIRTH:  1976-03-25   DATE OF PROCEDURE:  11/22/2004  DATE OF DISCHARGE:  11/22/2004                                 OPERATIVE REPORT   SURGEON:  Denman George, MD   ASSISTANT:  Pecola Leisure, PA   ANESTHETIC:  Local with MAC.   ANESTHESIOLOGIST:  Bedelia Person, MD   PREOPERATIVE DIAGNOSES:  1.  End-stage renal failure.  2.  Clotted left Cimino arteriovenous fistula.   POSTOPERATIVE DIAGNOSES:  1.  End-stage renal failure.  2.  Clotted left Cimino arteriovenous fistula.   PROCEDURE:  Thrombectomy, left Cimino arteriovenous fistula.   OPERATIVE PROCEDURE:  The patient was to the operating room in stable  condition.  Placed in a supine position.  Left arm prepped and draped in a  sterile fashion.  Skin and subcutaneous tissue were instilled with 1%  Xylocaine with epinephrine.  Longitudinal skin incision made through the  scar of the left wrist.  Dissection was carried down to expose the cephalic  vein.  This was followed down to an end-to-side anastomosis to the radial  artery.  The radial artery was noted to be small in caliber proximally.  Freed and encircled with Vesseloops proximal and distal to the anastomosis.   The patient was administered 3000 units of heparin intravenously.  The  radial artery was controlled proximally and distally with bulldog clamps.  Transverse venotomy made across the vein just beyond the anastomosis.  Thrombus was initially removed with forceps.  The arterial anastomosis was  noted to be widely patent.  The inflow radial artery was small in caliber.  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  The vein was  thrombectomized with a 3 Fogarty several times.  A moderate amount of  thrombus removed.  Excellent  backbleeding obtained.  The vein flushed easily  distally.   The transverse venotomy was then closed with a running 7-0 Prolene suture.  Clamps were then removed.  Excellent flow by Doppler present through the  fistula.   Adequate hemostasis obtained.  Sponges and instrument counts correct.   Subcutaneous tissue was closed with a running 3-0 Vicryl suture.  Skin was  closed 4-0 Monocryl.  Steri-Strips applied.  The patient tolerated the  procedure well.  No apparent complications.  Transferred to recovery room in  stable condition.       PGH/MEDQ  D:  11/22/2004  T:  11/23/2004  Job:  [id number]

## 2011-01-01 ENCOUNTER — Other Ambulatory Visit (HOSPITAL_COMMUNITY): Payer: Medicare Other

## 2011-01-08 ENCOUNTER — Ambulatory Visit (HOSPITAL_COMMUNITY): Admission: RE | Admit: 2011-01-08 | Payer: Medicare Other | Source: Ambulatory Visit | Admitting: Orthopaedic Surgery

## 2011-01-23 LAB — POCT I-STAT 4, (NA,K, GLUC, HGB,HCT)
Glucose, Bld: 92
HCT: 26 — ABNORMAL LOW
Potassium: 3.5

## 2011-01-24 LAB — CROSSMATCH
ABO/RH(D): O POS
Antibody Screen: NEGATIVE

## 2011-01-24 LAB — BASIC METABOLIC PANEL
CO2: 27
Calcium: 9.1
Creatinine, Ser: 5.98 — ABNORMAL HIGH
GFR calc Af Amer: 10 — ABNORMAL LOW
GFR calc non Af Amer: 8 — ABNORMAL LOW
Sodium: 131 — ABNORMAL LOW

## 2011-01-24 LAB — ABO/RH: ABO/RH(D): O POS

## 2011-01-24 LAB — CBC
MCHC: 32
RBC: 2.71 — ABNORMAL LOW
RDW: 19.2 — ABNORMAL HIGH

## 2011-02-12 LAB — POCT I-STAT 4, (NA,K, GLUC, HGB,HCT)
Glucose, Bld: 106 — ABNORMAL HIGH
HCT: 30 — ABNORMAL LOW
Operator id: 181601
Potassium: 3.9

## 2011-05-08 DIAGNOSIS — D509 Iron deficiency anemia, unspecified: Secondary | ICD-10-CM | POA: Diagnosis not present

## 2011-05-08 DIAGNOSIS — N2581 Secondary hyperparathyroidism of renal origin: Secondary | ICD-10-CM | POA: Diagnosis not present

## 2011-05-08 DIAGNOSIS — Z992 Dependence on renal dialysis: Secondary | ICD-10-CM | POA: Diagnosis not present

## 2011-05-08 DIAGNOSIS — D631 Anemia in chronic kidney disease: Secondary | ICD-10-CM | POA: Diagnosis not present

## 2011-05-08 DIAGNOSIS — N186 End stage renal disease: Secondary | ICD-10-CM | POA: Diagnosis not present

## 2011-06-06 DIAGNOSIS — N186 End stage renal disease: Secondary | ICD-10-CM | POA: Diagnosis not present

## 2011-06-07 DIAGNOSIS — N2581 Secondary hyperparathyroidism of renal origin: Secondary | ICD-10-CM | POA: Diagnosis not present

## 2011-06-07 DIAGNOSIS — Z992 Dependence on renal dialysis: Secondary | ICD-10-CM | POA: Diagnosis not present

## 2011-06-07 DIAGNOSIS — N186 End stage renal disease: Secondary | ICD-10-CM | POA: Diagnosis not present

## 2011-06-07 DIAGNOSIS — D631 Anemia in chronic kidney disease: Secondary | ICD-10-CM | POA: Diagnosis not present

## 2011-06-07 DIAGNOSIS — N039 Chronic nephritic syndrome with unspecified morphologic changes: Secondary | ICD-10-CM | POA: Diagnosis not present

## 2011-06-07 DIAGNOSIS — D509 Iron deficiency anemia, unspecified: Secondary | ICD-10-CM | POA: Diagnosis not present

## 2011-06-10 DIAGNOSIS — N2581 Secondary hyperparathyroidism of renal origin: Secondary | ICD-10-CM | POA: Diagnosis not present

## 2011-06-10 DIAGNOSIS — D509 Iron deficiency anemia, unspecified: Secondary | ICD-10-CM | POA: Diagnosis not present

## 2011-06-10 DIAGNOSIS — N186 End stage renal disease: Secondary | ICD-10-CM | POA: Diagnosis not present

## 2011-06-10 DIAGNOSIS — Z992 Dependence on renal dialysis: Secondary | ICD-10-CM | POA: Diagnosis not present

## 2011-06-10 DIAGNOSIS — N039 Chronic nephritic syndrome with unspecified morphologic changes: Secondary | ICD-10-CM | POA: Diagnosis not present

## 2011-06-12 DIAGNOSIS — N039 Chronic nephritic syndrome with unspecified morphologic changes: Secondary | ICD-10-CM | POA: Diagnosis not present

## 2011-06-12 DIAGNOSIS — Z992 Dependence on renal dialysis: Secondary | ICD-10-CM | POA: Diagnosis not present

## 2011-06-12 DIAGNOSIS — N186 End stage renal disease: Secondary | ICD-10-CM | POA: Diagnosis not present

## 2011-06-12 DIAGNOSIS — D509 Iron deficiency anemia, unspecified: Secondary | ICD-10-CM | POA: Diagnosis not present

## 2011-06-12 DIAGNOSIS — N2581 Secondary hyperparathyroidism of renal origin: Secondary | ICD-10-CM | POA: Diagnosis not present

## 2011-06-14 DIAGNOSIS — N039 Chronic nephritic syndrome with unspecified morphologic changes: Secondary | ICD-10-CM | POA: Diagnosis not present

## 2011-06-14 DIAGNOSIS — N186 End stage renal disease: Secondary | ICD-10-CM | POA: Diagnosis not present

## 2011-06-14 DIAGNOSIS — D509 Iron deficiency anemia, unspecified: Secondary | ICD-10-CM | POA: Diagnosis not present

## 2011-06-14 DIAGNOSIS — Z992 Dependence on renal dialysis: Secondary | ICD-10-CM | POA: Diagnosis not present

## 2011-06-14 DIAGNOSIS — N2581 Secondary hyperparathyroidism of renal origin: Secondary | ICD-10-CM | POA: Diagnosis not present

## 2011-06-17 DIAGNOSIS — D509 Iron deficiency anemia, unspecified: Secondary | ICD-10-CM | POA: Diagnosis not present

## 2011-06-17 DIAGNOSIS — Z992 Dependence on renal dialysis: Secondary | ICD-10-CM | POA: Diagnosis not present

## 2011-06-17 DIAGNOSIS — N2581 Secondary hyperparathyroidism of renal origin: Secondary | ICD-10-CM | POA: Diagnosis not present

## 2011-06-17 DIAGNOSIS — N186 End stage renal disease: Secondary | ICD-10-CM | POA: Diagnosis not present

## 2011-06-17 DIAGNOSIS — N039 Chronic nephritic syndrome with unspecified morphologic changes: Secondary | ICD-10-CM | POA: Diagnosis not present

## 2011-06-19 DIAGNOSIS — D631 Anemia in chronic kidney disease: Secondary | ICD-10-CM | POA: Diagnosis not present

## 2011-06-19 DIAGNOSIS — Z992 Dependence on renal dialysis: Secondary | ICD-10-CM | POA: Diagnosis not present

## 2011-06-19 DIAGNOSIS — D509 Iron deficiency anemia, unspecified: Secondary | ICD-10-CM | POA: Diagnosis not present

## 2011-06-19 DIAGNOSIS — N186 End stage renal disease: Secondary | ICD-10-CM | POA: Diagnosis not present

## 2011-06-19 DIAGNOSIS — N2581 Secondary hyperparathyroidism of renal origin: Secondary | ICD-10-CM | POA: Diagnosis not present

## 2011-06-21 DIAGNOSIS — Z992 Dependence on renal dialysis: Secondary | ICD-10-CM | POA: Diagnosis not present

## 2011-06-21 DIAGNOSIS — N039 Chronic nephritic syndrome with unspecified morphologic changes: Secondary | ICD-10-CM | POA: Diagnosis not present

## 2011-06-21 DIAGNOSIS — N186 End stage renal disease: Secondary | ICD-10-CM | POA: Diagnosis not present

## 2011-06-21 DIAGNOSIS — N2581 Secondary hyperparathyroidism of renal origin: Secondary | ICD-10-CM | POA: Diagnosis not present

## 2011-06-21 DIAGNOSIS — D509 Iron deficiency anemia, unspecified: Secondary | ICD-10-CM | POA: Diagnosis not present

## 2011-06-24 DIAGNOSIS — Z992 Dependence on renal dialysis: Secondary | ICD-10-CM | POA: Diagnosis not present

## 2011-06-24 DIAGNOSIS — D509 Iron deficiency anemia, unspecified: Secondary | ICD-10-CM | POA: Diagnosis not present

## 2011-06-24 DIAGNOSIS — N186 End stage renal disease: Secondary | ICD-10-CM | POA: Diagnosis not present

## 2011-06-24 DIAGNOSIS — N2581 Secondary hyperparathyroidism of renal origin: Secondary | ICD-10-CM | POA: Diagnosis not present

## 2011-06-24 DIAGNOSIS — N039 Chronic nephritic syndrome with unspecified morphologic changes: Secondary | ICD-10-CM | POA: Diagnosis not present

## 2011-06-26 DIAGNOSIS — N2581 Secondary hyperparathyroidism of renal origin: Secondary | ICD-10-CM | POA: Diagnosis not present

## 2011-06-26 DIAGNOSIS — N039 Chronic nephritic syndrome with unspecified morphologic changes: Secondary | ICD-10-CM | POA: Diagnosis not present

## 2011-06-26 DIAGNOSIS — Z992 Dependence on renal dialysis: Secondary | ICD-10-CM | POA: Diagnosis not present

## 2011-06-26 DIAGNOSIS — D509 Iron deficiency anemia, unspecified: Secondary | ICD-10-CM | POA: Diagnosis not present

## 2011-06-26 DIAGNOSIS — N186 End stage renal disease: Secondary | ICD-10-CM | POA: Diagnosis not present

## 2011-06-28 DIAGNOSIS — N186 End stage renal disease: Secondary | ICD-10-CM | POA: Diagnosis not present

## 2011-06-28 DIAGNOSIS — D509 Iron deficiency anemia, unspecified: Secondary | ICD-10-CM | POA: Diagnosis not present

## 2011-06-28 DIAGNOSIS — N2581 Secondary hyperparathyroidism of renal origin: Secondary | ICD-10-CM | POA: Diagnosis not present

## 2011-06-28 DIAGNOSIS — N039 Chronic nephritic syndrome with unspecified morphologic changes: Secondary | ICD-10-CM | POA: Diagnosis not present

## 2011-06-28 DIAGNOSIS — Z992 Dependence on renal dialysis: Secondary | ICD-10-CM | POA: Diagnosis not present

## 2011-07-01 DIAGNOSIS — Z992 Dependence on renal dialysis: Secondary | ICD-10-CM | POA: Diagnosis not present

## 2011-07-01 DIAGNOSIS — N2581 Secondary hyperparathyroidism of renal origin: Secondary | ICD-10-CM | POA: Diagnosis not present

## 2011-07-01 DIAGNOSIS — D509 Iron deficiency anemia, unspecified: Secondary | ICD-10-CM | POA: Diagnosis not present

## 2011-07-01 DIAGNOSIS — N186 End stage renal disease: Secondary | ICD-10-CM | POA: Diagnosis not present

## 2011-07-01 DIAGNOSIS — D631 Anemia in chronic kidney disease: Secondary | ICD-10-CM | POA: Diagnosis not present

## 2011-07-03 ENCOUNTER — Other Ambulatory Visit (HOSPITAL_COMMUNITY): Payer: Self-pay | Admitting: Nephrology

## 2011-07-03 ENCOUNTER — Ambulatory Visit (HOSPITAL_COMMUNITY)
Admission: RE | Admit: 2011-07-03 | Discharge: 2011-07-03 | Disposition: A | Discharge status: Home / Self Care | Payer: Medicare Other | Source: Ambulatory Visit | Attending: Nephrology | Admitting: Nephrology

## 2011-07-03 DIAGNOSIS — IMO0002 Reserved for concepts with insufficient information to code with codable children: Secondary | ICD-10-CM | POA: Diagnosis not present

## 2011-07-03 DIAGNOSIS — Y849 Medical procedure, unspecified as the cause of abnormal reaction of the patient, or of later complication, without mention of misadventure at the time of the procedure: Secondary | ICD-10-CM | POA: Insufficient documentation

## 2011-07-03 DIAGNOSIS — I120 Hypertensive chronic kidney disease with stage 5 chronic kidney disease or end stage renal disease: Secondary | ICD-10-CM | POA: Diagnosis not present

## 2011-07-03 DIAGNOSIS — Z01812 Encounter for preprocedural laboratory examination: Secondary | ICD-10-CM | POA: Diagnosis not present

## 2011-07-03 DIAGNOSIS — N186 End stage renal disease: Secondary | ICD-10-CM

## 2011-07-03 DIAGNOSIS — T82898A Other specified complication of vascular prosthetic devices, implants and grafts, initial encounter: Secondary | ICD-10-CM | POA: Insufficient documentation

## 2011-07-03 DIAGNOSIS — Z992 Dependence on renal dialysis: Secondary | ICD-10-CM | POA: Diagnosis not present

## 2011-07-03 MED ORDER — ALTEPLASE 2 MG IJ SOLR
2.0000 mg | Freq: Once | INTRAMUSCULAR | Status: AC
  Administered 2011-07-03: 2 mg
  Filled 2011-07-03: qty 2

## 2011-07-03 MED ORDER — HEPARIN SODIUM (PORCINE) 1000 UNIT/ML IJ SOLN
INTRAMUSCULAR | Status: AC
  Filled 2011-07-03: qty 1

## 2011-07-03 MED ORDER — IOHEXOL 300 MG/ML  SOLN
100.0000 mL | Freq: Once | INTRAMUSCULAR | Status: AC | PRN
  Administered 2011-07-03: 50 mL via INTRAVENOUS

## 2011-07-03 NOTE — ED Notes (Signed)
No sedation.  Patient is driving self home today.

## 2011-07-03 NOTE — Procedures (Signed)
Successful LFA AVG declot with 5 and 6mm PTA Minor venous extravasation at venous anastomosis responding to additional PTA No immed comp Stable Ready to use  Full report in PACS

## 2011-07-03 NOTE — ED Notes (Signed)
TPA 2mg  by Dr Miles Costain

## 2011-07-03 NOTE — ED Notes (Addendum)
K of 4.6 called to Gerome Apley PA.  Pt to keep 3 pm appt today.

## 2011-07-04 ENCOUNTER — Encounter (HOSPITAL_COMMUNITY): Payer: Self-pay | Admitting: *Deleted

## 2011-07-04 ENCOUNTER — Ambulatory Visit (HOSPITAL_COMMUNITY)
Admission: AD | Admit: 2011-07-04 | Discharge: 2011-07-04 | Disposition: A | Discharge status: Home / Self Care | Payer: Medicare Other | Source: Ambulatory Visit | Attending: Vascular Surgery | Admitting: Vascular Surgery

## 2011-07-04 ENCOUNTER — Ambulatory Visit (HOSPITAL_COMMUNITY): Payer: Medicare Other | Admitting: Anesthesiology

## 2011-07-04 ENCOUNTER — Telehealth (HOSPITAL_COMMUNITY): Payer: Self-pay

## 2011-07-04 ENCOUNTER — Encounter (HOSPITAL_COMMUNITY)
Admission: AD | Disposition: A | Discharge status: Home / Self Care | Payer: Self-pay | Source: Ambulatory Visit | Attending: Vascular Surgery

## 2011-07-04 ENCOUNTER — Encounter (HOSPITAL_COMMUNITY): Payer: Self-pay | Admitting: Anesthesiology

## 2011-07-04 ENCOUNTER — Other Ambulatory Visit: Payer: Self-pay | Admitting: *Deleted

## 2011-07-04 DIAGNOSIS — Z992 Dependence on renal dialysis: Secondary | ICD-10-CM | POA: Insufficient documentation

## 2011-07-04 DIAGNOSIS — I10 Essential (primary) hypertension: Secondary | ICD-10-CM | POA: Diagnosis not present

## 2011-07-04 DIAGNOSIS — N930 Postcoital and contact bleeding: Secondary | ICD-10-CM

## 2011-07-04 DIAGNOSIS — T82898A Other specified complication of vascular prosthetic devices, implants and grafts, initial encounter: Secondary | ICD-10-CM | POA: Insufficient documentation

## 2011-07-04 DIAGNOSIS — Y9210 Unspecified residential institution as the place of occurrence of the external cause: Secondary | ICD-10-CM | POA: Insufficient documentation

## 2011-07-04 DIAGNOSIS — T82598A Other mechanical complication of other cardiac and vascular devices and implants, initial encounter: Secondary | ICD-10-CM | POA: Diagnosis not present

## 2011-07-04 DIAGNOSIS — N186 End stage renal disease: Secondary | ICD-10-CM | POA: Insufficient documentation

## 2011-07-04 DIAGNOSIS — Y849 Medical procedure, unspecified as the cause of abnormal reaction of the patient, or of later complication, without mention of misadventure at the time of the procedure: Secondary | ICD-10-CM | POA: Insufficient documentation

## 2011-07-04 DIAGNOSIS — IMO0002 Reserved for concepts with insufficient information to code with codable children: Secondary | ICD-10-CM | POA: Insufficient documentation

## 2011-07-04 DIAGNOSIS — I120 Hypertensive chronic kidney disease with stage 5 chronic kidney disease or end stage renal disease: Secondary | ICD-10-CM | POA: Insufficient documentation

## 2011-07-04 DIAGNOSIS — Z01812 Encounter for preprocedural laboratory examination: Secondary | ICD-10-CM | POA: Insufficient documentation

## 2011-07-04 HISTORY — PX: THROMBECTOMY W/ EMBOLECTOMY: SHX2507

## 2011-07-04 HISTORY — DX: Encounter for other specified aftercare: Z51.89

## 2011-07-04 HISTORY — DX: End stage renal disease: N18.6

## 2011-07-04 HISTORY — DX: Essential (primary) hypertension: I10

## 2011-07-04 HISTORY — DX: Reserved for inherently not codable concepts without codable children: IMO0001

## 2011-07-04 LAB — GRAM STAIN

## 2011-07-04 LAB — HCG, SERUM, QUALITATIVE: Preg, Serum: NEGATIVE

## 2011-07-04 SURGERY — THROMBECTOMY ARTERIOVENOUS GORE-TEX GRAFT
Anesthesia: General | Site: Arm Upper | Laterality: Left | Wound class: Clean

## 2011-07-04 MED ORDER — ROCURONIUM BROMIDE 100 MG/10ML IV SOLN
INTRAVENOUS | Status: DC | PRN
  Administered 2011-07-04: 50 mg via INTRAVENOUS

## 2011-07-04 MED ORDER — MIDAZOLAM HCL 5 MG/5ML IJ SOLN
INTRAMUSCULAR | Status: DC | PRN
  Administered 2011-07-04: 2 mg via INTRAVENOUS

## 2011-07-04 MED ORDER — MUPIROCIN 2 % EX OINT
TOPICAL_OINTMENT | Freq: Two times a day (BID) | CUTANEOUS | Status: DC
  Administered 2011-07-04: 1 via NASAL

## 2011-07-04 MED ORDER — SODIUM CHLORIDE 0.9 % IR SOLN
Status: DC | PRN
  Administered 2011-07-04: 17:00:00

## 2011-07-04 MED ORDER — OXYCODONE-ACETAMINOPHEN 5-325 MG PO TABS: 1.0000 | ORAL_TABLET | Freq: Four times a day (QID) | ORAL | Status: DC | PRN

## 2011-07-04 MED ORDER — SODIUM CHLORIDE 0.9 % IV SOLN
INTRAVENOUS | Status: DC | PRN
  Administered 2011-07-04: 16:00:00 via INTRAVENOUS

## 2011-07-04 MED ORDER — SODIUM CHLORIDE 0.9 % IV SOLN
INTRAVENOUS | Status: DC
  Administered 2011-07-04: 35 mL/h via INTRAVENOUS

## 2011-07-04 MED ORDER — MUPIROCIN 2 % EX OINT
TOPICAL_OINTMENT | CUTANEOUS | Status: AC
  Administered 2011-07-04: 1 via NASAL
  Filled 2011-07-04: qty 22

## 2011-07-04 MED ORDER — NEOSTIGMINE METHYLSULFATE 1 MG/ML IJ SOLN
INTRAMUSCULAR | Status: DC | PRN
  Administered 2011-07-04: 5 mg via INTRAVENOUS

## 2011-07-04 MED ORDER — MUPIROCIN 2 % EX OINT: TOPICAL_OINTMENT | Freq: Two times a day (BID) | CUTANEOUS | Status: DC

## 2011-07-04 MED ORDER — THROMBIN 20000 UNITS EX KIT
PACK | CUTANEOUS | Status: DC | PRN
  Administered 2011-07-04: 17:00:00 via TOPICAL

## 2011-07-04 MED ORDER — DEXTROSE 5 % IV SOLN
1.5000 g | INTRAVENOUS | Status: AC
  Administered 2011-07-04: 1.5 g via INTRAVENOUS
  Filled 2011-07-04: qty 1.5

## 2011-07-04 MED ORDER — ONDANSETRON HCL 4 MG/2ML IJ SOLN: 4.0000 mg | Freq: Once | INTRAMUSCULAR | Status: DC | PRN

## 2011-07-04 MED ORDER — HYDROMORPHONE HCL PF 1 MG/ML IJ SOLN: 0.2500 mg | INTRAMUSCULAR | Status: DC | PRN

## 2011-07-04 MED ORDER — MEPERIDINE HCL 25 MG/ML IJ SOLN: 6.2500 mg | INTRAMUSCULAR | Status: DC | PRN

## 2011-07-04 MED ORDER — HEPARIN SODIUM (PORCINE) 1000 UNIT/ML IJ SOLN
INTRAMUSCULAR | Status: DC | PRN
  Administered 2011-07-04: 3000 [IU] via INTRAVENOUS

## 2011-07-04 MED ORDER — GLYCOPYRROLATE 0.2 MG/ML IJ SOLN
INTRAMUSCULAR | Status: DC | PRN
  Administered 2011-07-04: .7 mg via INTRAVENOUS

## 2011-07-04 MED ORDER — FENTANYL CITRATE 0.05 MG/ML IJ SOLN
INTRAMUSCULAR | Status: DC | PRN
  Administered 2011-07-04: 100 ug via INTRAVENOUS
  Administered 2011-07-04 (×2): 50 ug via INTRAVENOUS
  Administered 2011-07-04: 100 ug via INTRAVENOUS
  Administered 2011-07-04: 50 ug via INTRAVENOUS

## 2011-07-04 MED ORDER — PROPOFOL 10 MG/ML IV EMUL
INTRAVENOUS | Status: DC | PRN
  Administered 2011-07-04: 200 mg via INTRAVENOUS

## 2011-07-04 MED ORDER — 0.9 % SODIUM CHLORIDE (POUR BTL) OPTIME
TOPICAL | Status: DC | PRN
  Administered 2011-07-04: 1000 mL

## 2011-07-04 MED ORDER — PHENYLEPHRINE HCL 10 MG/ML IJ SOLN
INTRAMUSCULAR | Status: DC | PRN
  Administered 2011-07-04: 40 ug via INTRAVENOUS
  Administered 2011-07-04 (×4): 80 ug via INTRAVENOUS
  Administered 2011-07-04: 40 ug via INTRAVENOUS
  Administered 2011-07-04: 80 ug via INTRAVENOUS

## 2011-07-04 MED ORDER — ONDANSETRON HCL 4 MG/2ML IJ SOLN
INTRAMUSCULAR | Status: DC | PRN
  Administered 2011-07-04: 4 mg via INTRAVENOUS

## 2011-07-04 MED ORDER — SURGIFOAM 100 EX MISC
CUTANEOUS | Status: DC | PRN
  Administered 2011-07-04: 17:00:00 via TOPICAL

## 2011-07-04 SURGICAL SUPPLY — 76 items
ADH SKN CLS APL DERMABOND .7 (GAUZE/BANDAGES/DRESSINGS) ×2
APL SKNCLS STERI-STRIP NONHPOA (GAUZE/BANDAGES/DRESSINGS) ×2
BAG DECANTER FOR FLEXI CONT (MISCELLANEOUS) ×3 IMPLANT
BANDAGE ELASTIC 6 VELCRO ST LF (GAUZE/BANDAGES/DRESSINGS) ×2 IMPLANT
BENZOIN TINCTURE PRP APPL 2/3 (GAUZE/BANDAGES/DRESSINGS) ×2 IMPLANT
CANISTER SUCTION 2500CC (MISCELLANEOUS) ×3 IMPLANT
CATH CANNON HEMO 15F 50CM (CATHETERS) IMPLANT
CATH CANNON HEMO 15FR 19 (HEMODIALYSIS SUPPLIES) IMPLANT
CATH CANNON HEMO 15FR 23CM (HEMODIALYSIS SUPPLIES) IMPLANT
CATH CANNON HEMO 15FR 31CM (HEMODIALYSIS SUPPLIES) IMPLANT
CATH CANNON HEMO 15FR 32 (HEMODIALYSIS SUPPLIES) IMPLANT
CATH CANNON HEMO 15FR 32CM (HEMODIALYSIS SUPPLIES) IMPLANT
CATH EMB 4FR 40CM (CATHETERS) ×4 IMPLANT
CATH EMB 4FR 80CM (CATHETERS) ×3 IMPLANT
CATH EMB 5FR 80CM (CATHETERS) ×2 IMPLANT
CLIP TI MEDIUM 6 (CLIP) ×5 IMPLANT
CLIP TI WIDE RED SMALL 6 (CLIP) ×5 IMPLANT
CLOSURE STERI STRIP 1/2 X4 (GAUZE/BANDAGES/DRESSINGS) ×2 IMPLANT
CLOTH BEACON ORANGE TIMEOUT ST (SAFETY) ×3 IMPLANT
COVER PROBE W GEL 5X96 (DRAPES) ×5 IMPLANT
COVER SURGICAL LIGHT HANDLE (MISCELLANEOUS) ×6 IMPLANT
DERMABOND ADVANCED (GAUZE/BANDAGES/DRESSINGS) ×1
DERMABOND ADVANCED .7 DNX12 (GAUZE/BANDAGES/DRESSINGS) ×2 IMPLANT
DRAPE C-ARM 42X72 X-RAY (DRAPES) ×3 IMPLANT
DRAPE CHEST BREAST 15X10 FENES (DRAPES) ×3 IMPLANT
ELECT REM PT RETURN 9FT ADLT (ELECTROSURGICAL) ×3
ELECTRODE REM PT RTRN 9FT ADLT (ELECTROSURGICAL) ×2 IMPLANT
GAUZE SPONGE 2X2 8PLY STRL LF (GAUZE/BANDAGES/DRESSINGS) ×2 IMPLANT
GAUZE SPONGE 4X4 16PLY XRAY LF (GAUZE/BANDAGES/DRESSINGS) ×3 IMPLANT
GEL ULTRASOUND 20GR AQUASONIC (MISCELLANEOUS) IMPLANT
GLOVE BIO SURGEON STRL SZ7 (GLOVE) ×3 IMPLANT
GLOVE BIOGEL PI IND STRL 6.5 (GLOVE) ×2 IMPLANT
GLOVE BIOGEL PI IND STRL 7.5 (GLOVE) ×2 IMPLANT
GLOVE BIOGEL PI INDICATOR 6.5 (GLOVE) ×2
GLOVE BIOGEL PI INDICATOR 7.5 (GLOVE) ×1
GLOVE ECLIPSE 6.5 STRL STRAW (GLOVE) ×2 IMPLANT
GLOVE SS BIOGEL STRL SZ 7 (GLOVE) ×1 IMPLANT
GLOVE SUPERSENSE BIOGEL SZ 7 (GLOVE) ×1
GOWN STRL NON-REIN LRG LVL3 (GOWN DISPOSABLE) ×9 IMPLANT
GRAFT GORETEX STND 6X20 (Vascular Products) ×3 IMPLANT
GRAFT GORETEXSTD 6X20 (Vascular Products) ×1 IMPLANT
KIT BASIN OR (CUSTOM PROCEDURE TRAY) ×3 IMPLANT
KIT ROOM TURNOVER OR (KITS) ×3 IMPLANT
NDL 18GX1X1/2 (RX/OR ONLY) (NEEDLE) ×1 IMPLANT
NDL HYPO 25GX1X1/2 BEV (NEEDLE) ×1 IMPLANT
NEEDLE 18GX1X1/2 (RX/OR ONLY) (NEEDLE) ×3 IMPLANT
NEEDLE HYPO 25GX1X1/2 BEV (NEEDLE) ×3 IMPLANT
NS IRRIG 1000ML POUR BTL (IV SOLUTION) ×3 IMPLANT
PACK CV ACCESS (CUSTOM PROCEDURE TRAY) ×3 IMPLANT
PACK SURGICAL SETUP 50X90 (CUSTOM PROCEDURE TRAY) ×3 IMPLANT
PAD ARMBOARD 7.5X6 YLW CONV (MISCELLANEOUS) ×6 IMPLANT
SOAP 2 % CHG 4 OZ (WOUND CARE) ×3 IMPLANT
SPONGE GAUZE 2X2 STER 10/PKG (GAUZE/BANDAGES/DRESSINGS) ×1
SPONGE GAUZE 4X4 FOR O.R. (GAUZE/BANDAGES/DRESSINGS) ×2 IMPLANT
SPONGE SURGIFOAM ABS GEL 100 (HEMOSTASIS) ×4 IMPLANT
SUT ETHILON 3 0 PS 1 (SUTURE) ×3 IMPLANT
SUT GORETEX 5 0 TT13 24 (SUTURE) ×4 IMPLANT
SUT MNCRL AB 4-0 PS2 18 (SUTURE) ×7 IMPLANT
SUT PROLENE 5 0 C 1 24 (SUTURE) ×4 IMPLANT
SUT PROLENE 6 0 BV (SUTURE) ×3 IMPLANT
SUT VIC AB 2-0 CT1 36 (SUTURE) ×2 IMPLANT
SUT VIC AB 3-0 SH 27 (SUTURE) ×9
SUT VIC AB 3-0 SH 27X BRD (SUTURE) ×4 IMPLANT
SWAB CULTURE LIQUID MINI MALE (MISCELLANEOUS) ×2 IMPLANT
SYR 20CC LL (SYRINGE) ×6 IMPLANT
SYR 30ML LL (SYRINGE) IMPLANT
SYR 3ML LL SCALE MARK (SYRINGE) ×3 IMPLANT
SYR 5ML LL (SYRINGE) ×3 IMPLANT
SYR CONTROL 10ML LL (SYRINGE) ×3 IMPLANT
SYRINGE 10CC LL (SYRINGE) ×3 IMPLANT
TOPICAL THROMBIN 20,000 UNITS/20ML ×4 IMPLANT
TOWEL OR 17X24 6PK STRL BLUE (TOWEL DISPOSABLE) ×3 IMPLANT
TOWEL OR 17X26 10 PK STRL BLUE (TOWEL DISPOSABLE) ×3 IMPLANT
TUBE ANAEROBIC SPECIMEN COL (MISCELLANEOUS) ×2 IMPLANT
UNDERPAD 30X30 INCONTINENT (UNDERPADS AND DIAPERS) ×3 IMPLANT
WATER STERILE IRR 1000ML POUR (IV SOLUTION) ×3 IMPLANT

## 2011-07-04 NOTE — Op Note (Signed)
OPERATIVE NOTE   PROCEDURE:  Thrombectomy and revision of left forearm arteriovenous graft  PRE-OPERATIVE DIAGNOSIS: thrombosed left forearm arteriovenous graft after failed percutaneous thrombectomy  POST-OPERATIVE DIAGNOSIS: same as above   SURGEON: Leonides Sake, MD  ASSISTANT(S): Lianne Cure, PAC   ANESTHESIA: general  ESTIMATED BLOOD LOSS: 50 cc  FINDING(S): 1. Minimal hematoma in upper arm likely related to ruptured brachial vein during percutaneous thrombectomy 2. Unable to find suitable brachial vein for jump graft 3. 5 mm basilic vein in upper arm 4. Palpable weak thrill in graft at end of case 5. Dopplerable left radial artery at end of case 6. No obvious evidence of infected graft except small amount of cloudy fluid from venous end of graft  SPECIMEN(S):  Anaerobic and aerobic cultures from venous end of graft  INDICATIONS:   Kristine Rios is a 36 y.o. female who presents with thrombosed left forearm arteriovenous graft.  Risk, benefits, and alternatives to access surgery were discussed.  The patient is aware the risks include but are not limited to: bleeding, infection, steal syndrome, nerve damage, ischemic monomelic neuropathy, failure to mature, and need for additional procedures.  The patient is aware of the risks and elects to proceed forward.  DESCRIPTION: After full informed written consent was obtained from the patient, the patient was brought back to the operating room and placed supine upon the operating table.  The patient was given IV antibiotics prior to proceeding.  After obtaining adequate sedation, the patient was prepped and draped in standard fashion for a left arm access procedure.  I turned my attention first to the upper arm.  Under sonosite guidance, I identified what I thought was a large brachial vein.  I made a longitudinal incision over the vein.  This patient's arm was extremely deep due to excess adipose tissue.  Eventually I got down to the  brachial artery.  A small hematoma was released in this process, likely from the injured vein from yesterday's percutaneous thrombectomy.  I was not able to identify a brachial vein large enough for use as outflow.  I was able to find a 5 mm basilic vein so I elected to use this as the outflow from the graft.  I then turned my attention to the antecubitum.  I made a longitudinal incision over what I thought was the venous end of the graft.  After dissection, it became evident that this was in fact the arterial end.  I then had to make another longitudinal incision over the the venous end of the graft.   I gave the patient 3000 units of Heparin intravenously.  I dissected out this graft and made an graftotomy.  I was able to extract all the thrombus from the graft and return pulsatile bleeding to the forearm graft with a 4 Fogarty balloon.   In this process, I obtained some cloudy fluid from the distal end of the venous arm of the graft.  This was sent for stat gram stain which demonstrated PMNs without organisms.  There was absolutely no other evidence of an infected graft.  I clamped the graft near its arterial anastomosis.  I then transected a portion of the venous end of the graft to facilitate sewing in a jump graft.  I oversewed the proximal end of the venous arm of this graft with a running stitch of 5-0 Prolene to avoid getting into the hematoma caused by the brachial vein rupture.  I then spatulated the remaining end of the venous arm  of this graft.  I tunneled from the antecubitum to the upper arm incision and placed a 6 mm Goretex graft through the metal tunnel.  The graft was spatulated to facilitate an end to end anastomosis between the new and old graft.  This was sewn with CV-5 suture.  I released the clamp and there was minimal bleeding and I had re-thrombectomize the graft with the 4 Fogarty balloon.  I had return of pulsatile bleeding.  I reclamped the graft distal to the new anastomosis.  I then  clamped off the distal basilic vein and transected the vein.  I tied off the distal basilic vein with a 2-0 Silk.  I interrogated the vein with a 5-mm dilator, which passed through without any difficulty.  I then clamped off the proximal basilic vein.  I spatulated the proximal end of the new graft, adjusting the length in the process.  I also spatulated the basilic vein to facilitate an end to end anastomosis.  This was sewn with a CV-5 suture.  Prior to completing the anastomosis, I allowed the vein to backbleed, which was minimal.  There was also minimal bleeding from the graft, so I had to re-thrombectomize the graft with a 4-Fogarty balloon.  There was return of pulsatile bleeding from the graft.  I completed the anastomosis in the usual fashion.  I released all clamps.  Immediately, there was a thrill in the basilic vein and a weak thrill in the forearm loop.  The basilic vein had a flow signature consistent with a widely open arteriovenous graft.  There was a dopplerable left radial signal.  Thrombin and gelfoam was placed into all incisions.  Bleeding points were controlled with electrocautery.  The upper arm incision was closed with a double layer of 2-0 Vicryl in the deep subcutaneous tissue, a layer of 3-0 Vicryl, and a running subcuticular of 4-0 Monocryl in the skin.   The two antecubital incision were closed with a layer of 3-0 Vicryl, and a running subcuticular of 4-0 Monocryl in the skin.  Steristrips were used to reinforce all incisions.  Sterile bandages were applied to the incisions.  A gentle ACE wrap was applied to the upper arm.  COMPLICATIONS: none  CONDITION: stable   Nilda Simmer, MD 07/04/2011 7:01 PM

## 2011-07-04 NOTE — Anesthesia Postprocedure Evaluation (Signed)
  Anesthesia Post-op Note  Patient: Kristine Rios  Procedure(s) Performed: Procedure(s) (LRB): THROMBECTOMY ARTERIOVENOUS GORE-TEX GRAFT (Left)  Patient Location: PACU  Anesthesia Type: General  Level of Consciousness: awake  Airway and Oxygen Therapy: Patient Spontanous Breathing  Post-op Pain: mild  Post-op Assessment: Post-op Vital signs reviewed, Respiratory Function Stable, Patent Airway, No signs of Nausea or vomiting and Pain level controlled  Post-op Vital Signs: stable  Complications: No apparent anesthesia complications

## 2011-07-04 NOTE — Anesthesia Procedure Notes (Signed)
Procedure Name: Intubation Date/Time: 07/04/2011 4:24 PM Performed by: Romie Minus Pre-anesthesia Checklist: Patient identified, Emergency Drugs available, Suction available and Patient being monitored Patient Re-evaluated:Patient Re-evaluated prior to inductionOxygen Delivery Method: Circle system utilized Preoxygenation: Pre-oxygenation with 100% oxygen Intubation Type: IV induction Ventilation: Mask ventilation without difficulty and Oral airway inserted - appropriate to patient size Laryngoscope Size: Miller and 2 Grade View: Grade I Tube type: Oral Tube size: 7.5 mm Number of attempts: 1 Placement Confirmation: ETT inserted through vocal cords under direct vision,  positive ETCO2 and breath sounds checked- equal and bilateral Secured at: 22 cm Tube secured with: Tape Dental Injury: Teeth and Oropharynx as per pre-operative assessment

## 2011-07-04 NOTE — Anesthesia Preprocedure Evaluation (Addendum)
Anesthesia Evaluation  Patient identified by MRN, date of birth, ID band Patient awake    Reviewed: Allergy & Precautions, H&P , NPO status , Patient's Chart, lab work & pertinent test results  History of Anesthesia Complications Negative for: history of anesthetic complications  Airway Mallampati: I TM Distance: >3 FB Neck ROM: Full    Dental  (+) Teeth Intact and Dental Advisory Given   Pulmonary  clear to auscultation        Cardiovascular hypertension, Pt. on medications Regular Normal    Neuro/Psych    GI/Hepatic   Endo/Other  Morbid obesity  Renal/GU CRF and DialysisRenal diseaseHD x 8 yrs; last HD Mon 2/25     Musculoskeletal   Abdominal   Peds  Hematology   Anesthesia Other Findings   Reproductive/Obstetrics                        Anesthesia Physical Anesthesia Plan  ASA: III  Anesthesia Plan: General   Post-op Pain Management:    Induction: Intravenous  Airway Management Planned:   Additional Equipment:   Intra-op Plan:   Post-operative Plan: Extubation in OR  Informed Consent: I have reviewed the patients History and Physical, chart, labs and discussed the procedure including the risks, benefits and alternatives for the proposed anesthesia with the patient or authorized representative who has indicated his/her understanding and acceptance.   Dental advisory given  Plan Discussed with: Anesthesiologist, CRNA and Surgeon  Anesthesia Plan Comments:         Anesthesia Quick Evaluation

## 2011-07-04 NOTE — Preoperative (Signed)
Beta Blockers   Reason not to administer Beta Blockers:Not Applicable 

## 2011-07-04 NOTE — Transfer of Care (Signed)
Immediate Anesthesia Transfer of Care Note  Patient: Kristine Rios  Procedure(s) Performed: Procedure(s) (LRB): THROMBECTOMY ARTERIOVENOUS GORE-TEX GRAFT (Left)  Patient Location: PACU  Anesthesia Type: General  Level of Consciousness: awake, alert  and oriented  Airway & Oxygen Therapy: Patient Spontanous Breathing and Patient connected to face mask oxygen  Post-op Assessment: Report given to PACU RN and Post -op Vital signs reviewed and stable  Post vital signs: Reviewed and stable  Complications: No apparent anesthesia complications

## 2011-07-04 NOTE — H&P (Signed)
  [facility] [place]  Brief Access History and Physical  History of Present Illness  Kristine Rios is a 36 y.o. female who presents with chief complaint: thrombosed left FA AVG.  The patient presents today for T&R Left FA AVG, possible TDC.    PMH HTN Morbidly obese ESRD  PSH Multiple bilateral access procedures  History   Social History  . Marital Status: Single    Spouse Name: N/A    Number of Children: N/A  . Years of Education: N/A   Occupational History  . Not on file.   Social History Main Topics  . Smoking status: Not on file  . Smokeless tobacco: Not on file  . Alcohol Use: Not on file  . Drug Use: Not on file  . Sexually Active: Not on file   Other Topics Concern  . Not on file   Social History Narrative  . No narrative on file    No family history on file. FAMILY HISTORY: Positive for valve replacement in her brother.   Current Facility-Administered Medications on File Prior to Encounter  Medication Dose Route Frequency Provider Last Rate Last Dose  . alteplase (CATHFLO ACTIVASE) injection 2 mg  2 mg Intracatheter Once Michael T. Shick, MD   2 mg at 07/03/11 1149  . heparin 1000 UNIT/ML injection           . iohexol (OMNIPAQUE) 300 MG/ML solution 100 mL  100 mL Intravenous Once PRN Medication Radiologist, MD   50 mL at 07/03/11 1232   No current outpatient prescriptions on file prior to encounter.    No Known Allergies  Review of Systems: Kidney Disease, As listed above, otherwise negative.  Physical Examination  Filed Vitals:   07/04/11 1015  BP: 132/85  Pulse: 99  Temp: 98.2 F (36.8 C)  TempSrc: Oral  Resp: 20  SpO2: 94%   There is no height or weight on file to calculate BMI.  General: A&O x 3, WDWN  Pulmonary: Sym exp, good air movt, CTAB, no rales, rhonchi, & wheezing  Cardiac: RRR, Nl S1, S2, no Murmurs, rubs or gallops  Gastrointestinal: soft, NTND, -G/R, - HSM, - masses, - CVAT B  Musculoskeletal:  M/S 5/5 throughout , Extremities without ischemic changes , L FA AVG without thrill or bruit  Laboratory See iStat  Medical Decision Making  Kristine Rios is a 36 y.o. female who presents with: thrombosed L FA AVG.   The patient is scheduled for: T&R L FA AVG  Risk, benefits, and alternatives to access surgery were discussed.  The patient is aware the risks include but are not limited to: bleeding, infection, steal syndrome, nerve damage, ischemic monomelic neuropathy, failure to mature, and need for additional procedures.  The patient is aware of the risks and agrees to proceed.  Devota Viruet, MD [facility] Office: [phone] Pager: [phone]  07/04/2011, 10:16 AM    

## 2011-07-05 ENCOUNTER — Emergency Department (HOSPITAL_COMMUNITY)
Admission: AD | Admit: 2011-07-05 | Discharge: 2011-07-05 | Disposition: A | Discharge status: Home / Self Care | Payer: Medicare Other | Source: Ambulatory Visit | Attending: Vascular Surgery | Admitting: Vascular Surgery

## 2011-07-05 ENCOUNTER — Other Ambulatory Visit: Payer: Self-pay | Admitting: *Deleted

## 2011-07-05 ENCOUNTER — Encounter (HOSPITAL_COMMUNITY): Payer: Self-pay | Admitting: Emergency Medicine

## 2011-07-05 DIAGNOSIS — T82898A Other specified complication of vascular prosthetic devices, implants and grafts, initial encounter: Secondary | ICD-10-CM | POA: Diagnosis not present

## 2011-07-05 DIAGNOSIS — N186 End stage renal disease: Secondary | ICD-10-CM | POA: Diagnosis not present

## 2011-07-05 DIAGNOSIS — R0602 Shortness of breath: Secondary | ICD-10-CM | POA: Diagnosis not present

## 2011-07-05 DIAGNOSIS — T82868A Thrombosis of vascular prosthetic devices, implants and grafts, initial encounter: Secondary | ICD-10-CM

## 2011-07-05 DIAGNOSIS — Z79899 Other long term (current) drug therapy: Secondary | ICD-10-CM | POA: Diagnosis not present

## 2011-07-05 DIAGNOSIS — E669 Obesity, unspecified: Secondary | ICD-10-CM | POA: Diagnosis not present

## 2011-07-05 DIAGNOSIS — Z992 Dependence on renal dialysis: Secondary | ICD-10-CM | POA: Diagnosis not present

## 2011-07-05 DIAGNOSIS — I120 Hypertensive chronic kidney disease with stage 5 chronic kidney disease or end stage renal disease: Secondary | ICD-10-CM | POA: Diagnosis not present

## 2011-07-05 LAB — POCT I-STAT 4, (NA,K, GLUC, HGB,HCT)
Glucose, Bld: 94 mg/dL (ref 70–99)
HCT: 29 % — ABNORMAL LOW (ref 36.0–46.0)
Hemoglobin: 9.9 g/dL — ABNORMAL LOW (ref 12.0–15.0)
Potassium: 4.7 mEq/L (ref 3.5–5.1)
Sodium: 137 mEq/L (ref 135–145)

## 2011-07-05 LAB — COMPREHENSIVE METABOLIC PANEL
ALT: 5 U/L (ref 0–35)
AST: 9 U/L (ref 0–37)
Albumin: 2.8 g/dL — ABNORMAL LOW (ref 3.5–5.2)
Alkaline Phosphatase: 65 U/L (ref 39–117)
BUN: 71 mg/dL — ABNORMAL HIGH (ref 6–23)
CO2: 22 mEq/L (ref 19–32)
Calcium: 7.8 mg/dL — ABNORMAL LOW (ref 8.4–10.5)
Chloride: 93 mEq/L — ABNORMAL LOW (ref 96–112)
Creatinine, Ser: 15.89 mg/dL — ABNORMAL HIGH (ref 0.50–1.10)
GFR calc Af Amer: 3 mL/min — ABNORMAL LOW (ref >90)
GFR calc non Af Amer: 3 mL/min — ABNORMAL LOW (ref >90)
Glucose, Bld: 89 mg/dL (ref 70–99)
Potassium: 5.2 mEq/L — ABNORMAL HIGH (ref 3.5–5.1)
Sodium: 132 mEq/L — ABNORMAL LOW (ref 135–145)
Total Bilirubin: 0.3 mg/dL (ref 0.3–1.2)
Total Protein: 7.5 g/dL (ref 6.0–8.3)

## 2011-07-05 LAB — CBC
HCT: 26.3 % — ABNORMAL LOW (ref 36.0–46.0)
MCV: 85.7 fL (ref 78.0–100.0)
RBC: 3.07 MIL/uL — ABNORMAL LOW (ref 3.87–5.11)
WBC: 8.2 10*3/uL (ref 4.0–10.5)

## 2011-07-05 MED ORDER — SODIUM POLYSTYRENE SULFONATE 15 GM/60ML PO SUSP
30.0000 g | Freq: Once | ORAL | Status: AC
  Administered 2011-07-05: 30 g via ORAL
  Filled 2011-07-05: qty 60

## 2011-07-05 NOTE — ED Notes (Signed)
The pt has not had  Dialysis since Monday.  She has no vascular access.  She has had srgery on her lt arm graft preparation on this past Wednesday and Thursday.  She was at dialysis this am and was told her graft was not working and that she needed a dialysis cath inserted to have dialysis.  Sl sob at present.  sats good she does not void
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
## 2011-07-05 NOTE — ED Notes (Signed)
Pt here from dialysis after here graft has clotted and they are unable to perform dialysis, pt has not had dialysis since monday

## 2011-07-05 NOTE — ED Notes (Signed)
Dr Rodell Perna office has called and is going to  Insert a dialysis catheter tomorrow and are  Inquiring if the pt is going to be admitted.  The pt has not been seen by the edp yet.  They are going to place admission orders in epic  Just

## 2011-07-05 NOTE — Discharge Instructions (Signed)
Arteriovenous (AV) Access for Hemodialysis An arteriovenous (AV) access is a surgically created or placed tube that allows for repeated access to the blood in your body. This access is required for hemodialysis, a type of dialysis. Dialysis is a treatment process that filters and cleans the blood in order to eliminate toxic wastes from the body when the kidneys fail to do this on their own. There are several different access methods used for hemodialysis. ACCESS METHODS  Double lumen catheter. A flexible tube with 2 channels may be used on a temporary or long-term basis. A long-term use catheter often has a cuff that holds it in place. The catheter is surgically placed and tunneled under the skin. This catheter is often placed when dialysis is needed in an emergency, such as when the kidneys suddenly stop working. It may also be needed when a permanent AV access fails or has not yet been placed. A catheter is usually placed into one of the following:   Large vein under your collarbone (subclavian vein).   Large vein in your neck (jugular vein).   Temporarily, in the large vein in your groin (femoral vein).   AV graft. A man-made (synthetic) material may be used to connect an artery and vein in the arm or thigh. This graft takes around 2 weeks to develop (mature) and is often placed a few weeks prior to use. A graft can generally last from 1 to 2 years.   AV fistula. A minor surgical procedure may be done to connect an artery and vein, creating a fistula. This causes arterial blood to flow directly into a vein. The vein gets larger, allowing easier access for dialysis. The fistula takes around 12 weeks to mature and must be placed several months before dialysis is anticipated. A fistula provides the best access for hemodialysis and can last for several years.  It is critical that veins in patients at high risk to develop kidney failure are preserved. This may include avoiding blood pressure checks and  intravenous (IV) or lab draws from the arm. This maximizes the chance for creating a functioning AV access when needed. Although a fistula is the most desirable access to use for hemodialysis, it may not be possible. If the veins are not large enough or there is no time to wait for a fistula to mature, a graft or catheter may be used. RISKS AND COMPLICATIONS   Double lumen catheter. A catheter may develop serious infections. It may also develop a clot (thrombosis) and fail. A catheter can cause clots in the vein in which it is placed. A subclavian vein catheter is the most likely to cause thrombosis. When the subclavian vein clots, it makes it very difficult for the patient to sustain an AV graft or fistula. When possible, catheters should be avoided and a more permanent AV access should be placed.   AV graft. A graft may swell after surgery, but this should decrease as it heals. A graft may stop working properly due to thrombosis or the diameter of the tube getting smaller. The tube can eventually become blocked (stenosis). If a partial blockage is found relatively early, it can be treated. Left untreated, the stenosis will progress until the vessel is completely blocked. Infection may also occur.   AV fistula. Infection and thrombosis are the biggest risks with a fistula. However, the rates for infection and stenosis are lower with fistulas than the other 2 methods. Frequent thrombosis may require creating a backup fistula at another site.   This will allow for dialysis when one access is blocked.  HOME CARE INSTRUCTIONS   Keep the site of the cut (incision) clean and dry while it heals. This helps prevent infection.   If you have a catheter, do not shower while the incision is healing. After it is healed, ask your caregiver for recommendations on showering. The bandage (dressing) will be changed at the dialysis center. Do not remove this dressing. However, if it becomes wet or loose, a sterile gauze  dressing may be placed over the site and secured by placing adhesive tape on the edges.   If you have a graft or fistula, clean the site daily until it heals completely. Stitches will be removed, usually after about 10 to 14 days. Once the access is being used for dialysis, a small dressing will be placed over the needle sites after the treatment is done. Keep this dressing on for at least 12 hours. Keep your arm or thigh clean and dry during this time.   A small amount of bleeding is normal, especially if the access is new. If you have a large amount of bleeding and cannot stop it, this is not normal. Call your caregiver right away. You will be taught how to hold your graft or fistula sites to stop the bleeding.  If you have a graft or fistula:  A "bruit" is a noise that is heard with a stethoscope and a "thrill" is a vibration felt over the graft or fistula. The presence of the bruit and thrill indicate that the access is working. You will be taught to feel for the thrill each day. If this is not felt, the access may be clotted. Call your caregiver.   You may use the arm freely after the site heals. Keep the following in mind:   Avoid pressure on the arm.   Avoid lifting heavy objects with the arm.   Avoid sleeping on the arm with the graft or fistula.   Avoid wearing tight-sleeved shirts or jewelry around the graft or fistula.   Do not allow blood pressure monitoring or needle punctures on the side where the graft or fistula is located.   With permission from your caregiver, you may do exercises to help with blood flow through a fistula. These exercises involve squeezing a rubber ball or other soft objects as instructed.  SEEK MEDICAL CARE IF:   Chills develop.   You have an oral temperature above 102 F (38.9 C).   Swelling around the graft or fistula gets worse.   New pain develops.   Unusual bleeding develops.   Pus or other fluid (drainage) is seen at the AV access site.    Skin redness or red streaking is seen on the skin around, above, or below the AV access.  SEEK IMMEDIATE MEDICAL CARE IF:   Pain, numbness, or an unusual pale skin color develops in the hand on the side of your fistula.   Dizziness or weakness develops that you have not had before.   Shortness of breath develops.   Chest pain develops.   The AV access has bleeding that cannot be easily controlled.  Wear a medical alert bracelet to let caregivers know you are a dialysis patient, so they can care for your veins appropriately. Document Released: 07/13/2002 Document Revised: 01/02/2011 Document Reviewed: 09/19/2009 [facility] Patient Information 2012 [place], [place].  RESOURCE GUIDE  Dental Problems  Patients with Medicaid: [facility]  [facility] [address]                                           [phone] [address] [employer] [address] Phone:  [phone]                                                  Phone:  [phone]  If unable to pay or uninsured, contact:  [facility] or [facility]. to become qualified for the adult dental clinic.  Chronic Pain Problems Contact [facility]  [phone] Patients need to be referred by their primary care doctor.  Insufficient Money for Medicine Contact [employer]:  call "211" or [facility] (260) 223-9792.  No Primary Care Doctor Call [facility]  [phone] Other agencies that provide inexpensive medical care    [facility]  [phone]    [facility]  [phone]    [facility]  [phone]    [facility]  [phone]    [facility]  ([phone]    [facility]  5018659663  Psychological Services [facility] Behavior[facility]  [phone] [facility]  [phone] [facility]   [phone] (emergency services 505-080-7038)  Substance Abuse Resources [facility]  920-105-7038 [facility] 5137715226 [facility] (361)570-6798 Floydene Flock 475-882-9940 Residential & Outpatient Substance Abuse Program  (601)206-1131  Abuse/Neglect [facility] Child Abuse Hotline 309-256-4178 [facility] Child Abuse Hotline 985-052-1181 (After Hours)  Emergency Shelter [facility] 912-041-1797  Maternity Homes [facility] 226 542 1973 [place] Alert Services (470) [phone]  MRSA Hotline #:   347 795 1175    [facility] Resources  [facility]     [employer]                          [facility] Dept. [address] [place]                       [address]      [address]  Blondell Reveal Phone:  [phone]                                   Phone:  [phone]                 Phone:  [phone]  [facility] Mental Health Phone:  [phone]  [facility] Child Abuse Hotline (905) 734-1348 [phone] (After Hours)

## 2011-07-06 ENCOUNTER — Inpatient Hospital Stay (HOSPITAL_COMMUNITY): Payer: Medicare Other

## 2011-07-06 ENCOUNTER — Ambulatory Visit: Admit: 2011-07-06 | Payer: Medicare Other | Admitting: Vascular Surgery

## 2011-07-06 ENCOUNTER — Emergency Department (HOSPITAL_COMMUNITY)
Admission: EM | Admit: 2011-07-06 | Discharge: 2011-07-06 | Disposition: A | Discharge status: Home / Self Care | Payer: Medicare Other | Source: Home / Self Care | Attending: Emergency Medicine | Admitting: Emergency Medicine

## 2011-07-06 ENCOUNTER — Encounter (HOSPITAL_COMMUNITY): Payer: Self-pay | Admitting: *Deleted

## 2011-07-06 ENCOUNTER — Encounter (HOSPITAL_COMMUNITY): Payer: Self-pay | Admitting: Anesthesiology

## 2011-07-06 ENCOUNTER — Encounter (HOSPITAL_COMMUNITY)
Admission: EM | Disposition: A | Discharge status: Home / Self Care | Payer: Self-pay | Source: Home / Self Care | Attending: Emergency Medicine

## 2011-07-06 ENCOUNTER — Other Ambulatory Visit: Payer: Self-pay

## 2011-07-06 ENCOUNTER — Inpatient Hospital Stay (HOSPITAL_COMMUNITY): Payer: Medicare Other | Admitting: Anesthesiology

## 2011-07-06 ENCOUNTER — Ambulatory Visit (HOSPITAL_COMMUNITY)
Admission: AD | Admit: 2011-07-06 | Discharge: 2011-07-06 | Disposition: A | Discharge status: Home / Self Care | Payer: Medicare Other | Source: Ambulatory Visit | Attending: Vascular Surgery | Admitting: Vascular Surgery

## 2011-07-06 ENCOUNTER — Encounter (HOSPITAL_COMMUNITY)
Admission: AD | Disposition: A | Discharge status: Home / Self Care | Payer: Self-pay | Source: Ambulatory Visit | Attending: Vascular Surgery

## 2011-07-06 DIAGNOSIS — N186 End stage renal disease: Secondary | ICD-10-CM | POA: Insufficient documentation

## 2011-07-06 DIAGNOSIS — D539 Nutritional anemia, unspecified: Secondary | ICD-10-CM | POA: Diagnosis not present

## 2011-07-06 DIAGNOSIS — I517 Cardiomegaly: Secondary | ICD-10-CM | POA: Diagnosis not present

## 2011-07-06 DIAGNOSIS — Z992 Dependence on renal dialysis: Secondary | ICD-10-CM | POA: Insufficient documentation

## 2011-07-06 DIAGNOSIS — R0989 Other specified symptoms and signs involving the circulatory and respiratory systems: Secondary | ICD-10-CM | POA: Diagnosis not present

## 2011-07-06 DIAGNOSIS — R0602 Shortness of breath: Secondary | ICD-10-CM

## 2011-07-06 DIAGNOSIS — T82898A Other specified complication of vascular prosthetic devices, implants and grafts, initial encounter: Secondary | ICD-10-CM | POA: Diagnosis not present

## 2011-07-06 DIAGNOSIS — I120 Hypertensive chronic kidney disease with stage 5 chronic kidney disease or end stage renal disease: Secondary | ICD-10-CM | POA: Insufficient documentation

## 2011-07-06 DIAGNOSIS — E878 Other disorders of electrolyte and fluid balance, not elsewhere classified: Secondary | ICD-10-CM | POA: Diagnosis not present

## 2011-07-06 DIAGNOSIS — E119 Type 2 diabetes mellitus without complications: Secondary | ICD-10-CM | POA: Diagnosis not present

## 2011-07-06 DIAGNOSIS — Y832 Surgical operation with anastomosis, bypass or graft as the cause of abnormal reaction of the patient, or of later complication, without mention of misadventure at the time of the procedure: Secondary | ICD-10-CM | POA: Insufficient documentation

## 2011-07-06 DIAGNOSIS — N039 Chronic nephritic syndrome with unspecified morphologic changes: Secondary | ICD-10-CM | POA: Diagnosis not present

## 2011-07-06 DIAGNOSIS — N259 Disorder resulting from impaired renal tubular function, unspecified: Secondary | ICD-10-CM | POA: Diagnosis not present

## 2011-07-06 DIAGNOSIS — D509 Iron deficiency anemia, unspecified: Secondary | ICD-10-CM | POA: Diagnosis not present

## 2011-07-06 DIAGNOSIS — Z79899 Other long term (current) drug therapy: Secondary | ICD-10-CM | POA: Insufficient documentation

## 2011-07-06 DIAGNOSIS — N2581 Secondary hyperparathyroidism of renal origin: Secondary | ICD-10-CM | POA: Diagnosis not present

## 2011-07-06 DIAGNOSIS — Z09 Encounter for follow-up examination after completed treatment for conditions other than malignant neoplasm: Secondary | ICD-10-CM | POA: Diagnosis not present

## 2011-07-06 DIAGNOSIS — E669 Obesity, unspecified: Secondary | ICD-10-CM | POA: Insufficient documentation

## 2011-07-06 DIAGNOSIS — R7309 Other abnormal glucose: Secondary | ICD-10-CM | POA: Diagnosis not present

## 2011-07-06 DIAGNOSIS — K7689 Other specified diseases of liver: Secondary | ICD-10-CM | POA: Diagnosis not present

## 2011-07-06 HISTORY — PX: INSERTION OF DIALYSIS CATHETER: SHX1324

## 2011-07-06 SURGERY — INSERTION OF DIALYSIS CATHETER
Anesthesia: Monitor Anesthesia Care

## 2011-07-06 SURGERY — INSERTION OF DIALYSIS CATHETER
Anesthesia: Monitor Anesthesia Care | Site: Chest | Laterality: Left | Wound class: Clean

## 2011-07-06 MED ORDER — LIDOCAINE HCL (PF) 1 % IJ SOLN
INTRAMUSCULAR | Status: DC | PRN
  Administered 2011-07-06: 5 mL via INTRADERMAL

## 2011-07-06 MED ORDER — PROPOFOL 10 MG/ML IV EMUL
INTRAVENOUS | Status: DC | PRN
  Administered 2011-07-06: 50 ug/kg/min via INTRAVENOUS

## 2011-07-06 MED ORDER — MIDAZOLAM HCL 5 MG/5ML IJ SOLN
INTRAMUSCULAR | Status: DC | PRN
  Administered 2011-07-06: 2 mg via INTRAVENOUS

## 2011-07-06 MED ORDER — IOHEXOL 300 MG/ML  SOLN
INTRAMUSCULAR | Status: DC | PRN
  Administered 2011-07-06: 20 mL via INTRAVENOUS

## 2011-07-06 MED ORDER — SODIUM CHLORIDE 0.9 % IV SOLN: INTRAVENOUS | Status: DC

## 2011-07-06 MED ORDER — MEPERIDINE HCL 25 MG/ML IJ SOLN: 6.2500 mg | INTRAMUSCULAR | Status: DC | PRN

## 2011-07-06 MED ORDER — DEXTROSE 5 % IV SOLN
INTRAVENOUS | Status: AC
  Filled 2011-07-06: qty 50

## 2011-07-06 MED ORDER — CEFUROXIME SODIUM 1.5 G IJ SOLR
INTRAMUSCULAR | Status: AC
  Filled 2011-07-06: qty 1.5

## 2011-07-06 MED ORDER — FENTANYL CITRATE 0.05 MG/ML IJ SOLN: 25.0000 ug | INTRAMUSCULAR | Status: DC | PRN

## 2011-07-06 MED ORDER — FENTANYL CITRATE 0.05 MG/ML IJ SOLN
INTRAMUSCULAR | Status: DC | PRN
  Administered 2011-07-06: 100 ug via INTRAVENOUS
  Administered 2011-07-06: 50 ug via INTRAVENOUS

## 2011-07-06 MED ORDER — OXYCODONE-ACETAMINOPHEN 5-500 MG PO CAPS: 1.0000 | ORAL_CAPSULE | ORAL | Status: AC | PRN

## 2011-07-06 MED ORDER — SODIUM CHLORIDE 0.9 % IV SOLN
INTRAVENOUS | Status: DC | PRN
  Administered 2011-07-06: 09:00:00 via INTRAVENOUS

## 2011-07-06 MED ORDER — SODIUM CHLORIDE 0.9 % IR SOLN
Status: DC | PRN
  Administered 2011-07-06: 1000 mL

## 2011-07-06 MED ORDER — DEXTROSE 5 % IV SOLN
1.5000 g | INTRAVENOUS | Status: DC | PRN
  Administered 2011-07-06: 1.5 g via INTRAVENOUS

## 2011-07-06 SURGICAL SUPPLY — 48 items
BAG DECANTER FOR FLEXI CONT (MISCELLANEOUS) ×2 IMPLANT
CATH BEACON 5.038 65CM KMP-01 (CATHETERS) ×1 IMPLANT
CATH CANNON HEMO 15F 50CM (CATHETERS) IMPLANT
CATH CANNON HEMO 15FR 19 (HEMODIALYSIS SUPPLIES) IMPLANT
CATH CANNON HEMO 15FR 23CM (HEMODIALYSIS SUPPLIES) ×1 IMPLANT
CATH CANNON HEMO 15FR 31CM (HEMODIALYSIS SUPPLIES) IMPLANT
CATH CANNON HEMO 15FR 32 (HEMODIALYSIS SUPPLIES) IMPLANT
CATH CANNON HEMO 15FR 32CM (HEMODIALYSIS SUPPLIES) IMPLANT
CATH STRAIGHT 5FR 65CM (CATHETERS) ×1 IMPLANT
CLOTH BEACON ORANGE TIMEOUT ST (SAFETY) ×2 IMPLANT
COVER PROBE W GEL 5X96 (DRAPES) IMPLANT
COVER SURGICAL LIGHT HANDLE (MISCELLANEOUS) ×2 IMPLANT
DECANTER SPIKE VIAL GLASS SM (MISCELLANEOUS) ×2 IMPLANT
DRAPE C-ARM 42X72 X-RAY (DRAPES) ×2 IMPLANT
DRAPE CHEST BREAST 15X10 FENES (DRAPES) ×2 IMPLANT
DRSG TEGADERM 4X4.75 (GAUZE/BANDAGES/DRESSINGS) ×1 IMPLANT
GAUZE SPONGE 2X2 8PLY STRL LF (GAUZE/BANDAGES/DRESSINGS) ×1 IMPLANT
GAUZE SPONGE 4X4 16PLY XRAY LF (GAUZE/BANDAGES/DRESSINGS) ×2 IMPLANT
GLOVE SS BIOGEL STRL SZ 7 (GLOVE) ×1 IMPLANT
GLOVE SUPERSENSE BIOGEL SZ 7 (GLOVE) ×1
GOWN STRL NON-REIN LRG LVL3 (GOWN DISPOSABLE) ×4 IMPLANT
GUIDEWIRE ANGLED .035X150CM (WIRE) ×1 IMPLANT
GUIDEWIRE UNCOATED ST S 7038 (WIRE) ×1 IMPLANT
KIT BASIN OR (CUSTOM PROCEDURE TRAY) ×2 IMPLANT
KIT ROOM TURNOVER OR (KITS) ×2 IMPLANT
NDL 18GX1X1/2 (RX/OR ONLY) (NEEDLE) ×1 IMPLANT
NDL HYPO 25GX1X1/2 BEV (NEEDLE) ×1 IMPLANT
NEEDLE 18GX1X1/2 (RX/OR ONLY) (NEEDLE) ×2 IMPLANT
NEEDLE 22X1 1/2 (OR ONLY) (NEEDLE) ×2 IMPLANT
NEEDLE HYPO 25GX1X1/2 BEV (NEEDLE) ×2 IMPLANT
NS IRRIG 1000ML POUR BTL (IV SOLUTION) ×2 IMPLANT
PACK SURGICAL SETUP 50X90 (CUSTOM PROCEDURE TRAY) ×2 IMPLANT
PAD ARMBOARD 7.5X6 YLW CONV (MISCELLANEOUS) ×4 IMPLANT
SOAP 2 % CHG 4 OZ (WOUND CARE) ×2 IMPLANT
SPONGE GAUZE 2X2 8PLY STRL LF (GAUZE/BANDAGES/DRESSINGS) ×1 IMPLANT
SPONGE GAUZE 2X2 STER 10/PKG (GAUZE/BANDAGES/DRESSINGS) ×1
SUT ETHILON 3 0 PS 1 (SUTURE) ×2 IMPLANT
SUT VICRYL 4-0 PS2 18IN ABS (SUTURE) ×2 IMPLANT
SYR 20CC LL (SYRINGE) ×2 IMPLANT
SYR 30ML LL (SYRINGE) IMPLANT
SYR 5ML LL (SYRINGE) ×4 IMPLANT
SYR CONTROL 10ML LL (SYRINGE) ×2 IMPLANT
SYRINGE 10CC LL (SYRINGE) ×2 IMPLANT
TOWEL OR 17X24 6PK STRL BLUE (TOWEL DISPOSABLE) ×2 IMPLANT
TOWEL OR 17X26 10 PK STRL BLUE (TOWEL DISPOSABLE) ×2 IMPLANT
WATER STERILE IRR 1000ML POUR (IV SOLUTION) ×2 IMPLANT
WIRE AMPLATZ SS-J .035X180CM (WIRE) ×1 IMPLANT
cannon hemodialysis catheter ×1 IMPLANT

## 2011-07-06 NOTE — Discharge Instructions (Addendum)
Arteriovenous (AV) Access for HemodialysisDialysis Care After Dialysis is a treatment that removes the toxic wastes from the body when the kidneys fail to do this on their own. There are 2 types of dialysis:  Hemodialysis. Blood is pumped from the body through a filter (dialyzer). The blood is cleaned of waste products and returned to the body. Hemodialysis is performed in a dialysis center for 3 to 4 hours, 3 times a week. Dialysis is performed through an arteriovenous (AV) access, which provides access to the blood vessel. The main advantage of hemodialysis is that no training is required of the patient. Disadvantages include potential AV access failure and lack of freedom, as you must stay relatively near a dialysis center.   Peritoneal dialysis. The body's own lining (membrane) is used as a filter. A fluid drains in and out of the abdomen to get rid of the body's toxic wastes. Advantages are that it can be taught to the patient and can be done at home with careful technique. It allows more freedom and less discomfort. Disadvantages include potential inflammation inside the abdomen (peritonitis) and membrane failure.  HOME CARE INSTRUCTIONS  If you have an AV access:   Check for a "thrill" at your access site every day.This is a vibrating or buzzing feeling when you place your fingers over the access site.This means the access is working. If you do not feel a "thrill," the access needs to be repaired.   Never wear tight clothing or jewelry around the access.   Avoid sleeping on the access. This can decrease circulation and can cause the access to clot.   Keep the bandage (dressing) on for a couple hours after your treatment or as told by your caregiver. Avoid getting your dressing wet. If the dressing comes off, cover the access site with a 4x4 gauze dressing and tape securely.   Keep your access site clean to prevent infection.   Keep some 4x4 gauze dressings at home in case your access  starts to bleed. You may have received a medicine that can cause bleeding called heparin with your treatment.   Control your blood pressure. High blood pressure (hypertension) damages your heart and vessels. It is important to exercise as much as possible.   Keep your cholesterol under control. Exercise regularly or as directed.  SEEK MEDICAL CARE IF:  You do not feel a "thrill" in your AV access.   You have a fever, chills, sweats, or weakness.   You have a small amount of bleeding at your access site.   Your blood pressure is too high.  SEEK IMMEDIATE MEDICAL CARE IF:  You have sudden chest pain or trouble breathing.   You have bleeding that cannot be stopped or controlled with direct pressure.  MAKE SURE YOU:  Understand these instructions.   Will watch your condition.   Will get help right away if you are not doing well or get worse.  Document Released: 11/12/2004 Document Revised: 01/02/2011 Document Reviewed: 05/29/2010 [facility] Patient Information 2012 [place], [place]. An arteriovenous (AV) access is a surgically created or placed tube that allows for repeated access to the blood in your body. This access is required for hemodialysis, a type of dialysis. Dialysis is a treatment process that filters and cleans the blood in order to eliminate toxic wastes from the body when the kidneys fail to do this on their own. There are several different access methods used for hemodialysis. ACCESS METHODS  Double lumen catheter. A flexible tube with 2  channels may be used on a temporary or long-term basis. A long-term use catheter often has a cuff that holds it in place. The catheter is surgically placed and tunneled under the skin. This catheter is often placed when dialysis is needed in an emergency, such as when the kidneys suddenly stop working. It may also be needed when a permanent AV access fails or has not yet been placed. A catheter is usually placed into one of the following:    Large vein under your collarbone (subclavian vein).   Large vein in your neck (jugular vein).   Temporarily, in the large vein in your groin (femoral vein).   AV graft. A man-made (synthetic) material may be used to connect an artery and vein in the arm or thigh. This graft takes around 2 weeks to develop (mature) and is often placed a few weeks prior to use. A graft can generally last from 1 to 2 years.   AV fistula. A minor surgical procedure may be done to connect an artery and vein, creating a fistula. This causes arterial blood to flow directly into a vein. The vein gets larger, allowing easier access for dialysis. The fistula takes around 12 weeks to mature and must be placed several months before dialysis is anticipated. A fistula provides the best access for hemodialysis and can last for several years.  It is critical that veins in patients at high risk to develop kidney failure are preserved. This may include avoiding blood pressure checks and intravenous (IV) or lab draws from the arm. This maximizes the chance for creating a functioning AV access when needed. Although a fistula is the most desirable access to use for hemodialysis, it may not be possible. If the veins are not large enough or there is no time to wait for a fistula to mature, a graft or catheter may be used. RISKS AND COMPLICATIONS   Double lumen catheter. A catheter may develop serious infections. It may also develop a clot (thrombosis) and fail. A catheter can cause clots in the vein in which it is placed. A subclavian vein catheter is the most likely to cause thrombosis. When the subclavian vein clots, it makes it very difficult for the patient to sustain an AV graft or fistula. When possible, catheters should be avoided and a more permanent AV access should be placed.   AV graft. A graft may swell after surgery, but this should decrease as it heals. A graft may stop working properly due to thrombosis or the diameter of  the tube getting smaller. The tube can eventually become blocked (stenosis). If a partial blockage is found relatively early, it can be treated. Left untreated, the stenosis will progress until the vessel is completely blocked. Infection may also occur.   AV fistula. Infection and thrombosis are the biggest risks with a fistula. However, the rates for infection and stenosis are lower with fistulas than the other 2 methods. Frequent thrombosis may require creating a backup fistula at another site. This will allow for dialysis when one access is blocked.  HOME CARE INSTRUCTIONS   Keep the site of the cut (incision) clean and dry while it heals. This helps prevent infection.   If you have a catheter, do not shower while the incision is healing. After it is healed, ask your caregiver for recommendations on showering. The bandage (dressing) will be changed at the dialysis center. Do not remove this dressing. However, if it becomes wet or loose, a sterile gauze dressing may  be placed over the site and secured by placing adhesive tape on the edges.   If you have a graft or fistula, clean the site daily until it heals completely. Stitches will be removed, usually after about 10 to 14 days. Once the access is being used for dialysis, a small dressing will be placed over the needle sites after the treatment is done. Keep this dressing on for at least 12 hours. Keep your arm or thigh clean and dry during this time.   A small amount of bleeding is normal, especially if the access is new. If you have a large amount of bleeding and cannot stop it, this is not normal. Call your caregiver right away. You will be taught how to hold your graft or fistula sites to stop the bleeding.  If you have a graft or fistula:  A "bruit" is a noise that is heard with a stethoscope and a "thrill" is a vibration felt over the graft or fistula. The presence of the bruit and thrill indicate that the access is working. You will be taught  to feel for the thrill each day. If this is not felt, the access may be clotted. Call your caregiver.   You may use the arm freely after the site heals. Keep the following in mind:   Avoid pressure on the arm.   Avoid lifting heavy objects with the arm.   Avoid sleeping on the arm with the graft or fistula.   Avoid wearing tight-sleeved shirts or jewelry around the graft or fistula.   Do not allow blood pressure monitoring or needle punctures on the side where the graft or fistula is located.   With permission from your caregiver, you may do exercises to help with blood flow through a fistula. These exercises involve squeezing a rubber ball or other soft objects as instructed.  SEEK MEDICAL CARE IF:   Chills develop.   You have an oral temperature above 102 F (38.9 C).   Swelling around the graft or fistula gets worse.   New pain develops.   Unusual bleeding develops.   Pus or other fluid (drainage) is seen at the AV access site.   Skin redness or red streaking is seen on the skin around, above, or below the AV access.  SEEK IMMEDIATE MEDICAL CARE IF:   Pain, numbness, or an unusual pale skin color develops in the hand on the side of your fistula.   Dizziness or weakness develops that you have not had before.   Shortness of breath develops.   Chest pain develops.   The AV access has bleeding that cannot be easily controlled.  Wear a medical alert bracelet to let caregivers know you are a dialysis patient, so they can care for your veins appropriately. Document Released: 07/13/2002 Document Revised: 01/02/2011 Document Reviewed: 09/19/2009 [facility] Patient Information 2012 [place], [place].

## 2011-07-06 NOTE — H&P (View-Only) (Signed)
  [facility] [place]  Brief Access History and Physical  History of Present Illness  Kristine Rios is a 36 y.o. female who presents with chief complaint: thrombosed left FA AVG.  The patient presents today for T&R Left FA AVG, possible TDC.    PMH HTN Morbidly obese ESRD  PSH Multiple bilateral access procedures  History   Social History  . Marital Status: Single    Spouse Name: N/A    Number of Children: N/A  . Years of Education: N/A   Occupational History  . Not on file.   Social History Main Topics  . Smoking status: Not on file  . Smokeless tobacco: Not on file  . Alcohol Use: Not on file  . Drug Use: Not on file  . Sexually Active: Not on file   Other Topics Concern  . Not on file   Social History Narrative  . No narrative on file    No family history on file. FAMILY HISTORY: Positive for valve replacement in her brother.   Current Facility-Administered Medications on File Prior to Encounter  Medication Dose Route Frequency Provider Last Rate Last Dose  . alteplase (CATHFLO ACTIVASE) injection 2 mg  2 mg Intracatheter Once Casimiro Needle T. Shick, MD   2 mg at 07/03/11 1149  . heparin 1000 UNIT/ML injection           . iohexol (OMNIPAQUE) 300 MG/ML solution 100 mL  100 mL Intravenous Once PRN Medication Radiologist, MD   50 mL at 07/03/11 1232   No current outpatient prescriptions on file prior to encounter.    No Known Allergies  Review of Systems: Kidney Disease, As listed above, otherwise negative.  Physical Examination  Filed Vitals:   07/04/11 1015  BP: 132/85  Pulse: 99  Temp: 98.2 F (36.8 C)  TempSrc: Oral  Resp: 20  SpO2: 94%   There is no height or weight on file to calculate BMI.  General: A&O x 3, WDWN  Pulmonary: Sym exp, good air movt, CTAB, no rales, rhonchi, & wheezing  Cardiac: RRR, Nl S1, S2, no Murmurs, rubs or gallops  Gastrointestinal: soft, NTND, -G/R, - HSM, - masses, - CVAT B  Musculoskeletal:  M/S 5/5 throughout , Extremities without ischemic changes , L FA AVG without thrill or bruit  Laboratory See iStat  Medical Decision Making  Kristine Rios is a 36 y.o. female who presents with: thrombosed L FA AVG.   The patient is scheduled for: T&R L FA AVG  Risk, benefits, and alternatives to access surgery were discussed.  The patient is aware the risks include but are not limited to: bleeding, infection, steal syndrome, nerve damage, ischemic monomelic neuropathy, failure to mature, and need for additional procedures.  The patient is aware of the risks and agrees to proceed.  Leonides Sake, MD [facility] [place] Office: [phone] Pager: [phone]  07/04/2011, 10:16 AM

## 2011-07-06 NOTE — Anesthesia Preprocedure Evaluation (Addendum)
Anesthesia Evaluation  Patient identified by MRN, date of birth, ID band Patient awake    Reviewed: Allergy & Precautions, H&P , NPO status , Patient's Chart, lab work & pertinent test results, reviewed documented beta blocker date and time   Airway Mallampati: II TM Distance: >3 FB Neck ROM: Full    Dental  (+) Teeth Intact and Dental Advisory Given   Pulmonary  breath sounds clear to auscultation        Cardiovascular hypertension, Pt. on medications Rhythm:Regular Rate:Normal     Neuro/Psych    GI/Hepatic   Endo/Other  Morbid obesity  Renal/GU      Musculoskeletal   Abdominal (+) + obese,   Peds  Hematology   Anesthesia Other Findings   Reproductive/Obstetrics                          Anesthesia Physical Anesthesia Plan  ASA: III  Anesthesia Plan: MAC   Post-op Pain Management:    Induction: Intravenous  Airway Management Planned: Simple Face Mask  Additional Equipment:   Intra-op Plan:   Post-operative Plan:   Informed Consent: I have reviewed the patients History and Physical, chart, labs and discussed the procedure including the risks, benefits and alternatives for the proposed anesthesia with the patient or authorized representative who has indicated his/her understanding and acceptance.   Dental advisory given  Plan Discussed with: CRNA and Anesthesiologist  Anesthesia Plan Comments:        Anesthesia Quick Evaluation

## 2011-07-06 NOTE — Interval H&P Note (Signed)
History and Physical Interval Note:  07/06/2011 8:09 AM  Kristine Rios  has presented today for surgery, with the diagnosis of ESRD;CLOTTED AVG  The various methods of treatment have been discussed with the patient and family. After consideration of risks, benefits and other options for treatment, the patient has consented to  Procedure(s) (LRB): INSERTION OF DIALYSIS CATHETER (N/A) as a surgical intervention .  The patients' history has been reviewed, patient examined, no change in status, stable for surgery.  I have reviewed the patients' chart and labs.  Questions were answered to the patient's satisfaction.  Plan   HD Catheter placement.   Josephina Gip

## 2011-07-06 NOTE — Transfer of Care (Signed)
Immediate Anesthesia Transfer of Care Note  Patient: Kristine Rios  Procedure(s) Performed: Procedure(s) (LRB): INSERTION OF DIALYSIS CATHETER (Left)  Patient Location: PACU  Anesthesia Type: MAC  Level of Consciousness: awake, alert  and oriented  Airway & Oxygen Therapy: Patient Spontanous Breathing  Post-op Assessment: Report given to PACU RN and Post -op Vital signs reviewed and stable  Post vital signs: Reviewed  Complications: No apparent anesthesia complications

## 2011-07-06 NOTE — ED Notes (Signed)
Pt transported to short stay 

## 2011-07-06 NOTE — Op Note (Signed)
OPERATIVE REPORT  Date of Surgery: 07/06/2011  Surgeon: Josephina Gip, MD  Assistant:nurse  Pre-op Diagnosis: ESRD  Post-op Diagnosis: end stage renal disease  Procedure: Procedure(s): INSERTION OF DIALYSIS CATHETER-left IJ with central venogram Bilateral ultrasound localization  internal jugular veins Anesthesia: MAC  EBL: Minimal  Complications: None  Procedure Details: The patient was taken to the operating room placed in the Trendelenburg position the upper chest and neck were exposed. Using the sono site ultrasound both internal jugular veins are imaged both noted to be widely patent. After prepping and draping in routine sterile manner the right internal jugular vein was entered using a supraclavicular approach. Guidewire would not traverse this into the right atrium. Multiple attempts were made. A central venogram was performed through the right internal jugular vein which revealed some tortuosity at the innominate level but no definite obstruction. Eventually I then went to the left side left internal jugular vein was entered using a supraclavicular approach. Guidewire with traverse the mediastinum but would not go centrally. Finally utilizing a copy catheter and a Glidewire was able to traverse the angulation and the innominate pain to access the right ventricle. After dilating the tract appropriately over an Amplatz superstiff wire a 23 cm hemodialysis catheter was passed through peel-away sheath positioned in the right atrium towel peripherally and secured with nylon sutures. Was closed with Vicryl in a subcuticular fashion. Sterile dressing applied patient taken to the recovery room in stable condition   Josephina Gip, MD 07/06/2011 10:02 AM

## 2011-07-06 NOTE — Preoperative (Signed)
Beta Blockers   Reason not to administer Beta Blockers:Not Applicable. No home beta blockers 

## 2011-07-06 NOTE — ED Notes (Signed)
PT reports she was told to come her before her surgery. PT transported to short stay.

## 2011-07-06 NOTE — Discharge Instructions (Signed)
As soon as you leave ER, please go immediately to short stay for further management by Dr. Hart Rochester

## 2011-07-06 NOTE — ED Notes (Addendum)
Here to have HD catheter placed by Dr. Hart Rochester, last HD was Monday. "feels a little sob & fluid build up". Paperwork received from her HD center. Usually has HD at [facility] on [address] Mentions "only pain is in L arm from where they did surgery Tuesday",  L arm AVG not working.

## 2011-07-06 NOTE — Anesthesia Postprocedure Evaluation (Signed)
  Anesthesia Post-op Note  Patient: Kristine Rios  Procedure(s) Performed: Procedure(s) (LRB): INSERTION OF DIALYSIS CATHETER (Left)  Patient Location: PACU  Anesthesia Type: MAC  Level of Consciousness: awake, alert  and oriented  Airway and Oxygen Therapy: Patient Spontanous Breathing  Post-op Pain: mild  Post-op Assessment: Post-op Vital signs reviewed, Patient's Cardiovascular Status Stable, Respiratory Function Stable, Patent Airway and No signs of Nausea or vomiting  Post-op Vital Signs: Reviewed and stable  Complications: No apparent anesthesia complications
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
## 2011-07-07 LAB — WOUND CULTURE

## 2011-07-07 NOTE — ED Provider Notes (Signed)
Medical screening examination/treatment/procedure(s) were performed by non-physician practitioner and as supervising physician I was immediately available for consultation/collaboration.  Tyree Fluharty, MD 07/07/11 1638 

## 2011-07-08 DIAGNOSIS — N2581 Secondary hyperparathyroidism of renal origin: Secondary | ICD-10-CM | POA: Diagnosis not present

## 2011-07-08 DIAGNOSIS — E119 Type 2 diabetes mellitus without complications: Secondary | ICD-10-CM | POA: Diagnosis not present

## 2011-07-08 DIAGNOSIS — D509 Iron deficiency anemia, unspecified: Secondary | ICD-10-CM | POA: Diagnosis not present

## 2011-07-08 DIAGNOSIS — E878 Other disorders of electrolyte and fluid balance, not elsewhere classified: Secondary | ICD-10-CM | POA: Diagnosis not present

## 2011-07-08 DIAGNOSIS — N186 End stage renal disease: Secondary | ICD-10-CM | POA: Diagnosis not present

## 2011-07-08 DIAGNOSIS — R7309 Other abnormal glucose: Secondary | ICD-10-CM | POA: Diagnosis not present

## 2011-07-08 LAB — POCT I-STAT 4, (NA,K, GLUC, HGB,HCT)
Glucose, Bld: 98 mg/dL (ref 70–99)
HCT: 27 % — ABNORMAL LOW (ref 36.0–46.0)
Potassium: 5.2 mEq/L — ABNORMAL HIGH (ref 3.5–5.1)
Sodium: 133 mEq/L — ABNORMAL LOW (ref 135–145)

## 2011-07-09 LAB — ANAEROBIC CULTURE

## 2011-07-10 ENCOUNTER — Encounter (HOSPITAL_COMMUNITY): Payer: Self-pay | Admitting: Vascular Surgery

## 2011-07-11 DIAGNOSIS — E878 Other disorders of electrolyte and fluid balance, not elsewhere classified: Secondary | ICD-10-CM | POA: Diagnosis not present

## 2011-07-11 DIAGNOSIS — N186 End stage renal disease: Secondary | ICD-10-CM | POA: Diagnosis not present

## 2011-07-11 DIAGNOSIS — D509 Iron deficiency anemia, unspecified: Secondary | ICD-10-CM | POA: Diagnosis not present

## 2011-07-11 DIAGNOSIS — E119 Type 2 diabetes mellitus without complications: Secondary | ICD-10-CM | POA: Diagnosis not present

## 2011-07-11 DIAGNOSIS — R7309 Other abnormal glucose: Secondary | ICD-10-CM | POA: Diagnosis not present

## 2011-07-11 DIAGNOSIS — N2581 Secondary hyperparathyroidism of renal origin: Secondary | ICD-10-CM | POA: Diagnosis not present

## 2011-07-12 DIAGNOSIS — D509 Iron deficiency anemia, unspecified: Secondary | ICD-10-CM | POA: Diagnosis not present

## 2011-07-12 DIAGNOSIS — N186 End stage renal disease: Secondary | ICD-10-CM | POA: Diagnosis not present

## 2011-07-12 DIAGNOSIS — N2581 Secondary hyperparathyroidism of renal origin: Secondary | ICD-10-CM | POA: Diagnosis not present

## 2011-07-12 DIAGNOSIS — E878 Other disorders of electrolyte and fluid balance, not elsewhere classified: Secondary | ICD-10-CM | POA: Diagnosis not present

## 2011-07-12 DIAGNOSIS — R7309 Other abnormal glucose: Secondary | ICD-10-CM | POA: Diagnosis not present

## 2011-07-12 DIAGNOSIS — E119 Type 2 diabetes mellitus without complications: Secondary | ICD-10-CM | POA: Diagnosis not present

## 2011-07-15 DIAGNOSIS — E119 Type 2 diabetes mellitus without complications: Secondary | ICD-10-CM | POA: Diagnosis not present

## 2011-07-15 DIAGNOSIS — E878 Other disorders of electrolyte and fluid balance, not elsewhere classified: Secondary | ICD-10-CM | POA: Diagnosis not present

## 2011-07-15 DIAGNOSIS — N2581 Secondary hyperparathyroidism of renal origin: Secondary | ICD-10-CM | POA: Diagnosis not present

## 2011-07-15 DIAGNOSIS — N186 End stage renal disease: Secondary | ICD-10-CM | POA: Diagnosis not present

## 2011-07-15 DIAGNOSIS — D509 Iron deficiency anemia, unspecified: Secondary | ICD-10-CM | POA: Diagnosis not present

## 2011-07-15 DIAGNOSIS — R7309 Other abnormal glucose: Secondary | ICD-10-CM | POA: Diagnosis not present

## 2011-07-17 DIAGNOSIS — D509 Iron deficiency anemia, unspecified: Secondary | ICD-10-CM | POA: Diagnosis not present

## 2011-07-17 DIAGNOSIS — N186 End stage renal disease: Secondary | ICD-10-CM | POA: Diagnosis not present

## 2011-07-17 DIAGNOSIS — N2581 Secondary hyperparathyroidism of renal origin: Secondary | ICD-10-CM | POA: Diagnosis not present

## 2011-07-17 DIAGNOSIS — E878 Other disorders of electrolyte and fluid balance, not elsewhere classified: Secondary | ICD-10-CM | POA: Diagnosis not present

## 2011-07-17 DIAGNOSIS — R7309 Other abnormal glucose: Secondary | ICD-10-CM | POA: Diagnosis not present

## 2011-07-17 DIAGNOSIS — E119 Type 2 diabetes mellitus without complications: Secondary | ICD-10-CM | POA: Diagnosis not present

## 2011-07-20 DIAGNOSIS — N186 End stage renal disease: Secondary | ICD-10-CM | POA: Diagnosis not present

## 2011-07-22 DIAGNOSIS — N186 End stage renal disease: Secondary | ICD-10-CM | POA: Diagnosis not present

## 2011-07-22 DIAGNOSIS — D509 Iron deficiency anemia, unspecified: Secondary | ICD-10-CM | POA: Diagnosis not present

## 2011-07-22 DIAGNOSIS — E878 Other disorders of electrolyte and fluid balance, not elsewhere classified: Secondary | ICD-10-CM | POA: Diagnosis not present

## 2011-07-22 DIAGNOSIS — N2581 Secondary hyperparathyroidism of renal origin: Secondary | ICD-10-CM | POA: Diagnosis not present

## 2011-07-22 DIAGNOSIS — E119 Type 2 diabetes mellitus without complications: Secondary | ICD-10-CM | POA: Diagnosis not present

## 2011-07-22 DIAGNOSIS — R7309 Other abnormal glucose: Secondary | ICD-10-CM | POA: Diagnosis not present

## 2011-07-24 DIAGNOSIS — N2581 Secondary hyperparathyroidism of renal origin: Secondary | ICD-10-CM | POA: Diagnosis not present

## 2011-07-24 DIAGNOSIS — R7309 Other abnormal glucose: Secondary | ICD-10-CM | POA: Diagnosis not present

## 2011-07-24 DIAGNOSIS — E878 Other disorders of electrolyte and fluid balance, not elsewhere classified: Secondary | ICD-10-CM | POA: Diagnosis not present

## 2011-07-24 DIAGNOSIS — D509 Iron deficiency anemia, unspecified: Secondary | ICD-10-CM | POA: Diagnosis not present

## 2011-07-24 DIAGNOSIS — N186 End stage renal disease: Secondary | ICD-10-CM | POA: Diagnosis not present

## 2011-07-24 DIAGNOSIS — E119 Type 2 diabetes mellitus without complications: Secondary | ICD-10-CM | POA: Diagnosis not present

## 2011-07-26 ENCOUNTER — Encounter: Payer: Self-pay | Admitting: Surgery

## 2011-07-26 DIAGNOSIS — R7309 Other abnormal glucose: Secondary | ICD-10-CM | POA: Diagnosis not present

## 2011-07-26 DIAGNOSIS — E878 Other disorders of electrolyte and fluid balance, not elsewhere classified: Secondary | ICD-10-CM | POA: Diagnosis not present

## 2011-07-26 DIAGNOSIS — D509 Iron deficiency anemia, unspecified: Secondary | ICD-10-CM | POA: Diagnosis not present

## 2011-07-26 DIAGNOSIS — E119 Type 2 diabetes mellitus without complications: Secondary | ICD-10-CM | POA: Diagnosis not present

## 2011-07-26 DIAGNOSIS — N186 End stage renal disease: Secondary | ICD-10-CM | POA: Diagnosis not present

## 2011-07-26 DIAGNOSIS — N2581 Secondary hyperparathyroidism of renal origin: Secondary | ICD-10-CM | POA: Diagnosis not present

## 2011-07-29 ENCOUNTER — Ambulatory Visit: Payer: Medicare Other | Admitting: Surgery

## 2011-07-29 DIAGNOSIS — N2581 Secondary hyperparathyroidism of renal origin: Secondary | ICD-10-CM | POA: Diagnosis not present

## 2011-07-29 DIAGNOSIS — E119 Type 2 diabetes mellitus without complications: Secondary | ICD-10-CM | POA: Diagnosis not present

## 2011-07-29 DIAGNOSIS — E878 Other disorders of electrolyte and fluid balance, not elsewhere classified: Secondary | ICD-10-CM | POA: Diagnosis not present

## 2011-07-29 DIAGNOSIS — N186 End stage renal disease: Secondary | ICD-10-CM | POA: Diagnosis not present

## 2011-07-29 DIAGNOSIS — R7309 Other abnormal glucose: Secondary | ICD-10-CM | POA: Diagnosis not present

## 2011-07-29 DIAGNOSIS — D509 Iron deficiency anemia, unspecified: Secondary | ICD-10-CM | POA: Diagnosis not present

## 2011-07-31 DIAGNOSIS — D509 Iron deficiency anemia, unspecified: Secondary | ICD-10-CM | POA: Diagnosis not present

## 2011-07-31 DIAGNOSIS — E119 Type 2 diabetes mellitus without complications: Secondary | ICD-10-CM | POA: Diagnosis not present

## 2011-07-31 DIAGNOSIS — N186 End stage renal disease: Secondary | ICD-10-CM | POA: Diagnosis not present

## 2011-07-31 DIAGNOSIS — R7309 Other abnormal glucose: Secondary | ICD-10-CM | POA: Diagnosis not present

## 2011-07-31 DIAGNOSIS — E878 Other disorders of electrolyte and fluid balance, not elsewhere classified: Secondary | ICD-10-CM | POA: Diagnosis not present

## 2011-07-31 DIAGNOSIS — N2581 Secondary hyperparathyroidism of renal origin: Secondary | ICD-10-CM | POA: Diagnosis not present

## 2011-08-02 DIAGNOSIS — D509 Iron deficiency anemia, unspecified: Secondary | ICD-10-CM | POA: Diagnosis not present

## 2011-08-02 DIAGNOSIS — E878 Other disorders of electrolyte and fluid balance, not elsewhere classified: Secondary | ICD-10-CM | POA: Diagnosis not present

## 2011-08-02 DIAGNOSIS — N2581 Secondary hyperparathyroidism of renal origin: Secondary | ICD-10-CM | POA: Diagnosis not present

## 2011-08-02 DIAGNOSIS — E119 Type 2 diabetes mellitus without complications: Secondary | ICD-10-CM | POA: Diagnosis not present

## 2011-08-02 DIAGNOSIS — R7309 Other abnormal glucose: Secondary | ICD-10-CM | POA: Diagnosis not present

## 2011-08-02 DIAGNOSIS — N186 End stage renal disease: Secondary | ICD-10-CM | POA: Diagnosis not present

## 2011-08-04 DIAGNOSIS — N186 End stage renal disease: Secondary | ICD-10-CM | POA: Diagnosis not present

## 2011-08-05 DIAGNOSIS — D509 Iron deficiency anemia, unspecified: Secondary | ICD-10-CM | POA: Diagnosis not present

## 2011-08-05 DIAGNOSIS — N186 End stage renal disease: Secondary | ICD-10-CM | POA: Diagnosis not present

## 2011-08-05 DIAGNOSIS — N2581 Secondary hyperparathyroidism of renal origin: Secondary | ICD-10-CM | POA: Diagnosis not present

## 2011-08-05 DIAGNOSIS — D631 Anemia in chronic kidney disease: Secondary | ICD-10-CM | POA: Diagnosis not present

## 2011-08-09 ENCOUNTER — Ambulatory Visit: Payer: Medicare Other | Admitting: Vascular Surgery

## 2011-08-12 ENCOUNTER — Ambulatory Visit: Payer: Medicare Other | Admitting: Surgery

## 2011-08-29 ENCOUNTER — Encounter: Payer: Self-pay | Admitting: Vascular Surgery

## 2011-08-30 ENCOUNTER — Ambulatory Visit: Payer: Medicare Other | Admitting: Vascular Surgery

## 2011-09-03 DIAGNOSIS — N186 End stage renal disease: Secondary | ICD-10-CM | POA: Diagnosis not present

## 2011-09-04 DIAGNOSIS — D509 Iron deficiency anemia, unspecified: Secondary | ICD-10-CM | POA: Diagnosis not present

## 2011-09-04 DIAGNOSIS — D631 Anemia in chronic kidney disease: Secondary | ICD-10-CM | POA: Diagnosis not present

## 2011-09-04 DIAGNOSIS — N2581 Secondary hyperparathyroidism of renal origin: Secondary | ICD-10-CM | POA: Diagnosis not present

## 2011-09-04 DIAGNOSIS — N186 End stage renal disease: Secondary | ICD-10-CM | POA: Diagnosis not present

## 2011-09-04 DIAGNOSIS — E119 Type 2 diabetes mellitus without complications: Secondary | ICD-10-CM | POA: Diagnosis not present

## 2011-09-05 ENCOUNTER — Encounter: Payer: Self-pay | Admitting: Surgery

## 2011-09-06 ENCOUNTER — Ambulatory Visit: Payer: Medicare Other | Admitting: Vascular Surgery

## 2011-09-09 ENCOUNTER — Ambulatory Visit: Payer: Medicare Other | Admitting: Surgery

## 2011-10-04 DIAGNOSIS — N186 End stage renal disease: Secondary | ICD-10-CM | POA: Diagnosis not present

## 2011-10-07 DIAGNOSIS — N186 End stage renal disease: Secondary | ICD-10-CM | POA: Diagnosis not present

## 2011-10-07 DIAGNOSIS — D509 Iron deficiency anemia, unspecified: Secondary | ICD-10-CM | POA: Diagnosis not present

## 2011-10-07 DIAGNOSIS — D631 Anemia in chronic kidney disease: Secondary | ICD-10-CM | POA: Diagnosis not present

## 2011-10-07 DIAGNOSIS — N039 Chronic nephritic syndrome with unspecified morphologic changes: Secondary | ICD-10-CM | POA: Diagnosis not present

## 2011-10-07 DIAGNOSIS — N2581 Secondary hyperparathyroidism of renal origin: Secondary | ICD-10-CM | POA: Diagnosis not present

## 2011-10-09 DIAGNOSIS — N186 End stage renal disease: Secondary | ICD-10-CM | POA: Diagnosis not present

## 2011-10-09 DIAGNOSIS — N2581 Secondary hyperparathyroidism of renal origin: Secondary | ICD-10-CM | POA: Diagnosis not present

## 2011-10-09 DIAGNOSIS — N039 Chronic nephritic syndrome with unspecified morphologic changes: Secondary | ICD-10-CM | POA: Diagnosis not present

## 2011-10-09 DIAGNOSIS — D509 Iron deficiency anemia, unspecified: Secondary | ICD-10-CM | POA: Diagnosis not present

## 2011-10-11 DIAGNOSIS — D509 Iron deficiency anemia, unspecified: Secondary | ICD-10-CM | POA: Diagnosis not present

## 2011-10-11 DIAGNOSIS — N186 End stage renal disease: Secondary | ICD-10-CM | POA: Diagnosis not present

## 2011-10-11 DIAGNOSIS — D631 Anemia in chronic kidney disease: Secondary | ICD-10-CM | POA: Diagnosis not present

## 2011-10-11 DIAGNOSIS — N2581 Secondary hyperparathyroidism of renal origin: Secondary | ICD-10-CM | POA: Diagnosis not present

## 2011-10-14 DIAGNOSIS — N186 End stage renal disease: Secondary | ICD-10-CM | POA: Diagnosis not present

## 2011-10-14 DIAGNOSIS — D631 Anemia in chronic kidney disease: Secondary | ICD-10-CM | POA: Diagnosis not present

## 2011-10-14 DIAGNOSIS — D509 Iron deficiency anemia, unspecified: Secondary | ICD-10-CM | POA: Diagnosis not present

## 2011-10-14 DIAGNOSIS — N2581 Secondary hyperparathyroidism of renal origin: Secondary | ICD-10-CM | POA: Diagnosis not present

## 2011-10-16 DIAGNOSIS — N186 End stage renal disease: Secondary | ICD-10-CM | POA: Diagnosis not present

## 2011-10-16 DIAGNOSIS — N2581 Secondary hyperparathyroidism of renal origin: Secondary | ICD-10-CM | POA: Diagnosis not present

## 2011-10-16 DIAGNOSIS — N039 Chronic nephritic syndrome with unspecified morphologic changes: Secondary | ICD-10-CM | POA: Diagnosis not present

## 2011-10-16 DIAGNOSIS — D509 Iron deficiency anemia, unspecified: Secondary | ICD-10-CM | POA: Diagnosis not present

## 2011-10-18 DIAGNOSIS — N186 End stage renal disease: Secondary | ICD-10-CM | POA: Diagnosis not present

## 2011-10-18 DIAGNOSIS — N2581 Secondary hyperparathyroidism of renal origin: Secondary | ICD-10-CM | POA: Diagnosis not present

## 2011-10-18 DIAGNOSIS — N039 Chronic nephritic syndrome with unspecified morphologic changes: Secondary | ICD-10-CM | POA: Diagnosis not present

## 2011-10-18 DIAGNOSIS — D509 Iron deficiency anemia, unspecified: Secondary | ICD-10-CM | POA: Diagnosis not present

## 2011-10-21 DIAGNOSIS — D631 Anemia in chronic kidney disease: Secondary | ICD-10-CM | POA: Diagnosis not present

## 2011-10-21 DIAGNOSIS — N186 End stage renal disease: Secondary | ICD-10-CM | POA: Diagnosis not present

## 2011-10-21 DIAGNOSIS — N2581 Secondary hyperparathyroidism of renal origin: Secondary | ICD-10-CM | POA: Diagnosis not present

## 2011-10-21 DIAGNOSIS — D509 Iron deficiency anemia, unspecified: Secondary | ICD-10-CM | POA: Diagnosis not present

## 2011-10-23 DIAGNOSIS — N2581 Secondary hyperparathyroidism of renal origin: Secondary | ICD-10-CM | POA: Diagnosis not present

## 2011-10-23 DIAGNOSIS — D509 Iron deficiency anemia, unspecified: Secondary | ICD-10-CM | POA: Diagnosis not present

## 2011-10-23 DIAGNOSIS — N039 Chronic nephritic syndrome with unspecified morphologic changes: Secondary | ICD-10-CM | POA: Diagnosis not present

## 2011-10-23 DIAGNOSIS — D631 Anemia in chronic kidney disease: Secondary | ICD-10-CM | POA: Diagnosis not present

## 2011-10-23 DIAGNOSIS — N186 End stage renal disease: Secondary | ICD-10-CM | POA: Diagnosis not present

## 2011-10-25 DIAGNOSIS — N039 Chronic nephritic syndrome with unspecified morphologic changes: Secondary | ICD-10-CM | POA: Diagnosis not present

## 2011-10-25 DIAGNOSIS — N186 End stage renal disease: Secondary | ICD-10-CM | POA: Diagnosis not present

## 2011-10-25 DIAGNOSIS — D509 Iron deficiency anemia, unspecified: Secondary | ICD-10-CM | POA: Diagnosis not present

## 2011-10-25 DIAGNOSIS — N2581 Secondary hyperparathyroidism of renal origin: Secondary | ICD-10-CM | POA: Diagnosis not present

## 2011-10-28 DIAGNOSIS — N186 End stage renal disease: Secondary | ICD-10-CM | POA: Diagnosis not present

## 2011-10-28 DIAGNOSIS — N2581 Secondary hyperparathyroidism of renal origin: Secondary | ICD-10-CM | POA: Diagnosis not present

## 2011-10-28 DIAGNOSIS — D509 Iron deficiency anemia, unspecified: Secondary | ICD-10-CM | POA: Diagnosis not present

## 2011-10-28 DIAGNOSIS — D631 Anemia in chronic kidney disease: Secondary | ICD-10-CM | POA: Diagnosis not present

## 2011-10-30 DIAGNOSIS — N2581 Secondary hyperparathyroidism of renal origin: Secondary | ICD-10-CM | POA: Diagnosis not present

## 2011-10-30 DIAGNOSIS — D509 Iron deficiency anemia, unspecified: Secondary | ICD-10-CM | POA: Diagnosis not present

## 2011-10-30 DIAGNOSIS — D631 Anemia in chronic kidney disease: Secondary | ICD-10-CM | POA: Diagnosis not present

## 2011-10-30 DIAGNOSIS — N186 End stage renal disease: Secondary | ICD-10-CM | POA: Diagnosis not present

## 2011-11-02 DIAGNOSIS — D631 Anemia in chronic kidney disease: Secondary | ICD-10-CM | POA: Diagnosis not present

## 2011-11-02 DIAGNOSIS — N2581 Secondary hyperparathyroidism of renal origin: Secondary | ICD-10-CM | POA: Diagnosis not present

## 2011-11-02 DIAGNOSIS — N186 End stage renal disease: Secondary | ICD-10-CM | POA: Diagnosis not present

## 2011-11-02 DIAGNOSIS — D509 Iron deficiency anemia, unspecified: Secondary | ICD-10-CM | POA: Diagnosis not present

## 2011-11-02 DIAGNOSIS — N039 Chronic nephritic syndrome with unspecified morphologic changes: Secondary | ICD-10-CM | POA: Diagnosis not present

## 2011-11-03 DIAGNOSIS — N186 End stage renal disease: Secondary | ICD-10-CM | POA: Diagnosis not present

## 2011-11-04 DIAGNOSIS — N2581 Secondary hyperparathyroidism of renal origin: Secondary | ICD-10-CM | POA: Diagnosis not present

## 2011-11-04 DIAGNOSIS — D631 Anemia in chronic kidney disease: Secondary | ICD-10-CM | POA: Diagnosis not present

## 2011-11-04 DIAGNOSIS — N186 End stage renal disease: Secondary | ICD-10-CM | POA: Diagnosis not present

## 2011-11-04 DIAGNOSIS — D509 Iron deficiency anemia, unspecified: Secondary | ICD-10-CM | POA: Diagnosis not present

## 2011-12-04 DIAGNOSIS — N186 End stage renal disease: Secondary | ICD-10-CM | POA: Diagnosis not present

## 2011-12-06 DIAGNOSIS — N2581 Secondary hyperparathyroidism of renal origin: Secondary | ICD-10-CM | POA: Diagnosis not present

## 2011-12-06 DIAGNOSIS — N186 End stage renal disease: Secondary | ICD-10-CM | POA: Diagnosis not present

## 2011-12-06 DIAGNOSIS — D631 Anemia in chronic kidney disease: Secondary | ICD-10-CM | POA: Diagnosis not present

## 2011-12-06 DIAGNOSIS — D509 Iron deficiency anemia, unspecified: Secondary | ICD-10-CM | POA: Diagnosis not present

## 2011-12-13 ENCOUNTER — Encounter: Payer: Self-pay | Admitting: Surgery

## 2011-12-16 ENCOUNTER — Encounter (HOSPITAL_COMMUNITY): Payer: Self-pay

## 2011-12-16 ENCOUNTER — Other Ambulatory Visit: Payer: Self-pay

## 2011-12-16 ENCOUNTER — Ambulatory Visit (INDEPENDENT_AMBULATORY_CARE_PROVIDER_SITE_OTHER): Payer: Medicare Other | Admitting: Surgery

## 2011-12-16 ENCOUNTER — Encounter: Payer: Self-pay | Admitting: Surgery

## 2011-12-16 VITALS — BP 176/117 | HR 112 | Resp 16 | Ht 63.0 in | Wt 297.0 lb

## 2011-12-16 DIAGNOSIS — T82898A Other specified complication of vascular prosthetic devices, implants and grafts, initial encounter: Secondary | ICD-10-CM | POA: Insufficient documentation

## 2011-12-16 DIAGNOSIS — N186 End stage renal disease: Secondary | ICD-10-CM | POA: Diagnosis not present
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
## 2011-12-23 MED ORDER — SODIUM CHLORIDE 0.9 % IJ SOLN: 3.0000 mL | INTRAMUSCULAR | Status: DC | PRN

## 2011-12-24 ENCOUNTER — Ambulatory Visit (HOSPITAL_COMMUNITY): Admission: RE | Admit: 2011-12-24 | Payer: Medicare Other | Source: Ambulatory Visit | Admitting: Surgery

## 2011-12-24 ENCOUNTER — Encounter (HOSPITAL_COMMUNITY): Admission: RE | Payer: Self-pay | Source: Ambulatory Visit

## 2011-12-24 SURGERY — VENOGRAM
Anesthesia: LOCAL

## 2012-01-04 DIAGNOSIS — N186 End stage renal disease: Secondary | ICD-10-CM | POA: Diagnosis not present

## 2012-01-06 DIAGNOSIS — N186 End stage renal disease: Secondary | ICD-10-CM | POA: Diagnosis not present

## 2012-01-06 DIAGNOSIS — D631 Anemia in chronic kidney disease: Secondary | ICD-10-CM | POA: Diagnosis not present

## 2012-01-06 DIAGNOSIS — Z23 Encounter for immunization: Secondary | ICD-10-CM | POA: Diagnosis not present

## 2012-01-06 DIAGNOSIS — N2581 Secondary hyperparathyroidism of renal origin: Secondary | ICD-10-CM | POA: Diagnosis not present

## 2012-01-06 DIAGNOSIS — D509 Iron deficiency anemia, unspecified: Secondary | ICD-10-CM | POA: Diagnosis not present

## 2012-02-03 DIAGNOSIS — N186 End stage renal disease: Secondary | ICD-10-CM | POA: Diagnosis not present

## 2012-02-04 DIAGNOSIS — N186 End stage renal disease: Secondary | ICD-10-CM | POA: Diagnosis not present

## 2012-02-05 DIAGNOSIS — D631 Anemia in chronic kidney disease: Secondary | ICD-10-CM | POA: Diagnosis not present

## 2012-02-05 DIAGNOSIS — N2581 Secondary hyperparathyroidism of renal origin: Secondary | ICD-10-CM | POA: Diagnosis not present

## 2012-02-05 DIAGNOSIS — N186 End stage renal disease: Secondary | ICD-10-CM | POA: Diagnosis not present

## 2012-02-05 DIAGNOSIS — D509 Iron deficiency anemia, unspecified: Secondary | ICD-10-CM | POA: Diagnosis not present

## 2012-02-06 DIAGNOSIS — N186 End stage renal disease: Secondary | ICD-10-CM | POA: Diagnosis not present

## 2012-02-07 DIAGNOSIS — N2581 Secondary hyperparathyroidism of renal origin: Secondary | ICD-10-CM | POA: Diagnosis not present

## 2012-02-07 DIAGNOSIS — D631 Anemia in chronic kidney disease: Secondary | ICD-10-CM | POA: Diagnosis not present

## 2012-02-07 DIAGNOSIS — D509 Iron deficiency anemia, unspecified: Secondary | ICD-10-CM | POA: Diagnosis not present

## 2012-02-07 DIAGNOSIS — N186 End stage renal disease: Secondary | ICD-10-CM | POA: Diagnosis not present

## 2012-02-08 DIAGNOSIS — N186 End stage renal disease: Secondary | ICD-10-CM | POA: Diagnosis not present

## 2012-02-09 DIAGNOSIS — N186 End stage renal disease: Secondary | ICD-10-CM | POA: Diagnosis not present

## 2012-02-10 DIAGNOSIS — N039 Chronic nephritic syndrome with unspecified morphologic changes: Secondary | ICD-10-CM | POA: Diagnosis not present

## 2012-02-10 DIAGNOSIS — N2581 Secondary hyperparathyroidism of renal origin: Secondary | ICD-10-CM | POA: Diagnosis not present

## 2012-02-10 DIAGNOSIS — N186 End stage renal disease: Secondary | ICD-10-CM | POA: Diagnosis not present

## 2012-02-10 DIAGNOSIS — D509 Iron deficiency anemia, unspecified: Secondary | ICD-10-CM | POA: Diagnosis not present

## 2012-02-11 DIAGNOSIS — N186 End stage renal disease: Secondary | ICD-10-CM | POA: Diagnosis not present

## 2012-02-12 DIAGNOSIS — N2581 Secondary hyperparathyroidism of renal origin: Secondary | ICD-10-CM | POA: Diagnosis not present

## 2012-02-12 DIAGNOSIS — N186 End stage renal disease: Secondary | ICD-10-CM | POA: Diagnosis not present

## 2012-02-12 DIAGNOSIS — D509 Iron deficiency anemia, unspecified: Secondary | ICD-10-CM | POA: Diagnosis not present

## 2012-02-12 DIAGNOSIS — D631 Anemia in chronic kidney disease: Secondary | ICD-10-CM | POA: Diagnosis not present

## 2012-02-13 DIAGNOSIS — N186 End stage renal disease: Secondary | ICD-10-CM | POA: Diagnosis not present

## 2012-02-14 DIAGNOSIS — N186 End stage renal disease: Secondary | ICD-10-CM | POA: Diagnosis not present

## 2012-02-14 DIAGNOSIS — N2581 Secondary hyperparathyroidism of renal origin: Secondary | ICD-10-CM | POA: Diagnosis not present

## 2012-02-14 DIAGNOSIS — D509 Iron deficiency anemia, unspecified: Secondary | ICD-10-CM | POA: Diagnosis not present

## 2012-02-14 DIAGNOSIS — N039 Chronic nephritic syndrome with unspecified morphologic changes: Secondary | ICD-10-CM | POA: Diagnosis not present

## 2012-02-15 DIAGNOSIS — N186 End stage renal disease: Secondary | ICD-10-CM | POA: Diagnosis not present

## 2012-02-16 DIAGNOSIS — N186 End stage renal disease: Secondary | ICD-10-CM | POA: Diagnosis not present

## 2012-02-17 DIAGNOSIS — N2581 Secondary hyperparathyroidism of renal origin: Secondary | ICD-10-CM | POA: Diagnosis not present

## 2012-02-17 DIAGNOSIS — D509 Iron deficiency anemia, unspecified: Secondary | ICD-10-CM | POA: Diagnosis not present

## 2012-02-17 DIAGNOSIS — N186 End stage renal disease: Secondary | ICD-10-CM | POA: Diagnosis not present

## 2012-02-17 DIAGNOSIS — D631 Anemia in chronic kidney disease: Secondary | ICD-10-CM | POA: Diagnosis not present

## 2012-02-18 DIAGNOSIS — N186 End stage renal disease: Secondary | ICD-10-CM | POA: Diagnosis not present

## 2012-02-19 ENCOUNTER — Other Ambulatory Visit: Payer: Self-pay | Admitting: *Deleted

## 2012-02-19 DIAGNOSIS — N186 End stage renal disease: Secondary | ICD-10-CM | POA: Diagnosis not present

## 2012-02-19 DIAGNOSIS — D509 Iron deficiency anemia, unspecified: Secondary | ICD-10-CM | POA: Diagnosis not present

## 2012-02-19 DIAGNOSIS — N2581 Secondary hyperparathyroidism of renal origin: Secondary | ICD-10-CM | POA: Diagnosis not present

## 2012-02-19 DIAGNOSIS — D631 Anemia in chronic kidney disease: Secondary | ICD-10-CM | POA: Diagnosis not present

## 2012-02-20 ENCOUNTER — Encounter (HOSPITAL_COMMUNITY): Payer: Self-pay | Admitting: Pharmacy Technician

## 2012-02-20 DIAGNOSIS — N186 End stage renal disease: Secondary | ICD-10-CM | POA: Diagnosis not present

## 2012-02-21 DIAGNOSIS — N2581 Secondary hyperparathyroidism of renal origin: Secondary | ICD-10-CM | POA: Diagnosis not present

## 2012-02-21 DIAGNOSIS — N039 Chronic nephritic syndrome with unspecified morphologic changes: Secondary | ICD-10-CM | POA: Diagnosis not present

## 2012-02-21 DIAGNOSIS — N186 End stage renal disease: Secondary | ICD-10-CM | POA: Diagnosis not present

## 2012-02-21 DIAGNOSIS — D509 Iron deficiency anemia, unspecified: Secondary | ICD-10-CM | POA: Diagnosis not present

## 2012-02-22 DIAGNOSIS — N186 End stage renal disease: Secondary | ICD-10-CM | POA: Diagnosis not present

## 2012-02-23 DIAGNOSIS — N186 End stage renal disease: Secondary | ICD-10-CM | POA: Diagnosis not present

## 2012-02-24 ENCOUNTER — Emergency Department (HOSPITAL_COMMUNITY): Payer: Medicare Other

## 2012-02-24 ENCOUNTER — Inpatient Hospital Stay (HOSPITAL_COMMUNITY)
Admission: EM | Admit: 2012-02-24 | Discharge: 2012-03-04 | DRG: 175 | Disposition: A | Discharge status: Home / Self Care | Payer: Medicare Other | Attending: Internal Medicine | Admitting: Internal Medicine

## 2012-02-24 ENCOUNTER — Inpatient Hospital Stay (HOSPITAL_COMMUNITY): Payer: Medicare Other

## 2012-02-24 ENCOUNTER — Encounter (HOSPITAL_COMMUNITY): Payer: Self-pay | Admitting: *Deleted

## 2012-02-24 DIAGNOSIS — Z6841 Body Mass Index (BMI) 40.0 and over, adult: Secondary | ICD-10-CM | POA: Diagnosis not present

## 2012-02-24 DIAGNOSIS — N2581 Secondary hyperparathyroidism of renal origin: Secondary | ICD-10-CM | POA: Diagnosis present

## 2012-02-24 DIAGNOSIS — N186 End stage renal disease: Secondary | ICD-10-CM | POA: Diagnosis not present

## 2012-02-24 DIAGNOSIS — E875 Hyperkalemia: Secondary | ICD-10-CM | POA: Diagnosis present

## 2012-02-24 DIAGNOSIS — Z992 Dependence on renal dialysis: Secondary | ICD-10-CM | POA: Diagnosis not present

## 2012-02-24 DIAGNOSIS — R011 Cardiac murmur, unspecified: Secondary | ICD-10-CM | POA: Diagnosis present

## 2012-02-24 DIAGNOSIS — Z79899 Other long term (current) drug therapy: Secondary | ICD-10-CM | POA: Diagnosis not present

## 2012-02-24 DIAGNOSIS — Z7901 Long term (current) use of anticoagulants: Secondary | ICD-10-CM

## 2012-02-24 DIAGNOSIS — R000 Tachycardia, unspecified: Secondary | ICD-10-CM

## 2012-02-24 DIAGNOSIS — D631 Anemia in chronic kidney disease: Secondary | ICD-10-CM | POA: Diagnosis present

## 2012-02-24 DIAGNOSIS — I2789 Other specified pulmonary heart diseases: Secondary | ICD-10-CM | POA: Diagnosis not present

## 2012-02-24 DIAGNOSIS — Z23 Encounter for immunization: Secondary | ICD-10-CM | POA: Diagnosis not present

## 2012-02-24 DIAGNOSIS — I120 Hypertensive chronic kidney disease with stage 5 chronic kidney disease or end stage renal disease: Secondary | ICD-10-CM | POA: Diagnosis present

## 2012-02-24 DIAGNOSIS — I2699 Other pulmonary embolism without acute cor pulmonale: Principal | ICD-10-CM | POA: Diagnosis present

## 2012-02-24 DIAGNOSIS — R918 Other nonspecific abnormal finding of lung field: Secondary | ICD-10-CM | POA: Diagnosis not present

## 2012-02-24 DIAGNOSIS — R042 Hemoptysis: Secondary | ICD-10-CM | POA: Diagnosis not present

## 2012-02-24 DIAGNOSIS — D649 Anemia, unspecified: Secondary | ICD-10-CM | POA: Diagnosis not present

## 2012-02-24 DIAGNOSIS — J811 Chronic pulmonary edema: Secondary | ICD-10-CM | POA: Diagnosis not present

## 2012-02-24 DIAGNOSIS — J984 Other disorders of lung: Secondary | ICD-10-CM | POA: Diagnosis not present

## 2012-02-24 DIAGNOSIS — T82898A Other specified complication of vascular prosthetic devices, implants and grafts, initial encounter: Secondary | ICD-10-CM

## 2012-02-24 DIAGNOSIS — N039 Chronic nephritic syndrome with unspecified morphologic changes: Secondary | ICD-10-CM | POA: Diagnosis present

## 2012-02-24 DIAGNOSIS — R58 Hemorrhage, not elsewhere classified: Secondary | ICD-10-CM | POA: Diagnosis not present

## 2012-02-24 HISTORY — DX: Obesity, unspecified: E66.9

## 2012-02-24 LAB — BASIC METABOLIC PANEL
BUN: 95 mg/dL — ABNORMAL HIGH (ref 6–23)
Calcium: 8.7 mg/dL (ref 8.4–10.5)
GFR calc Af Amer: 3 mL/min — ABNORMAL LOW (ref >90)
GFR calc non Af Amer: 3 mL/min — ABNORMAL LOW (ref >90)
Glucose, Bld: 98 mg/dL (ref 70–99)
Sodium: 137 mEq/L (ref 135–145)

## 2012-02-24 LAB — CBC WITH DIFFERENTIAL/PLATELET
Basophils Relative: 0 % (ref 0–1)
Eosinophils Absolute: 0 10*3/uL (ref 0.0–0.7)
Eosinophils Relative: 1 % (ref 0–5)
Lymphs Abs: 1.6 10*3/uL (ref 0.7–4.0)
MCH: 28.2 pg (ref 26.0–34.0)
MCHC: 34.2 g/dL (ref 30.0–36.0)
MCV: 82.3 fL (ref 78.0–100.0)
Platelets: 202 10*3/uL (ref 150–400)
RBC: 4.12 MIL/uL (ref 3.87–5.11)
RDW: 15.9 % — ABNORMAL HIGH (ref 11.5–15.5)

## 2012-02-24 LAB — HEMOGLOBIN AND HEMATOCRIT, BLOOD
HCT: 30.4 % — ABNORMAL LOW (ref 36.0–46.0)
Hemoglobin: 10.1 g/dL — ABNORMAL LOW (ref 12.0–15.0)

## 2012-02-24 LAB — PROTIME-INR: INR: 1.03 (ref 0.00–1.49)

## 2012-02-24 MED ORDER — SODIUM POLYSTYRENE SULFONATE 15 GM/60ML PO SUSP
30.0000 g | Freq: Once | ORAL | Status: AC
  Administered 2012-02-24: 30 g via ORAL
  Filled 2012-02-24: qty 120

## 2012-02-24 MED ORDER — ACETAMINOPHEN 325 MG PO TABS: 650.0000 mg | ORAL_TABLET | Freq: Four times a day (QID) | ORAL | Status: DC | PRN

## 2012-02-24 MED ORDER — HEPARIN (PORCINE) IN NACL 100-0.45 UNIT/ML-% IJ SOLN
1300.0000 [IU]/h | INTRAMUSCULAR | Status: DC
  Administered 2012-02-24 – 2012-02-25 (×2): 1300 [IU]/h via INTRAVENOUS
  Filled 2012-02-24 (×3): qty 250

## 2012-02-24 MED ORDER — LANTHANUM CARBONATE 500 MG PO CHEW
2000.0000 mg | CHEWABLE_TABLET | Freq: Three times a day (TID) | ORAL | Status: DC
  Administered 2012-02-25 (×2): 2000 mg via ORAL
  Administered 2012-02-26: 1000 mg via ORAL
  Administered 2012-02-26 – 2012-03-04 (×18): 2000 mg via ORAL
  Filled 2012-02-24 (×28): qty 4

## 2012-02-24 MED ORDER — CINACALCET HCL 30 MG PO TABS
90.0000 mg | ORAL_TABLET | ORAL | Status: DC
  Administered 2012-02-24 – 2012-03-03 (×5): 90 mg via ORAL
  Filled 2012-02-24 (×5): qty 3

## 2012-02-24 MED ORDER — IOHEXOL 350 MG/ML SOLN
80.0000 mL | Freq: Once | INTRAVENOUS | Status: AC | PRN
  Administered 2012-02-24: 80 mL via INTRAVENOUS

## 2012-02-24 MED ORDER — LISINOPRIL 40 MG PO TABS
40.0000 mg | ORAL_TABLET | Freq: Every day | ORAL | Status: DC
  Administered 2012-02-25 – 2012-03-03 (×9): 40 mg via ORAL
  Filled 2012-02-24 (×12): qty 1

## 2012-02-24 MED ORDER — PARICALCITOL 5 MCG/ML IV SOLN
INTRAVENOUS | Status: AC
  Administered 2012-02-24: 2 ug via INTRAVENOUS
  Filled 2012-02-24: qty 1

## 2012-02-24 MED ORDER — HEPARIN BOLUS VIA INFUSION
5000.0000 [IU] | Freq: Once | INTRAVENOUS | Status: AC
  Administered 2012-02-24: 5000 [IU] via INTRAVENOUS

## 2012-02-24 MED ORDER — SODIUM CHLORIDE 0.9 % IJ SOLN
3.0000 mL | Freq: Two times a day (BID) | INTRAMUSCULAR | Status: DC
  Administered 2012-02-25 – 2012-03-03 (×6): 3 mL via INTRAVENOUS

## 2012-02-24 MED ORDER — LISINOPRIL 20 MG PO TABS
20.0000 mg | ORAL_TABLET | Freq: Every morning | ORAL | Status: DC
  Administered 2012-02-25 – 2012-03-04 (×9): 20 mg via ORAL
  Filled 2012-02-24 (×9): qty 1

## 2012-02-24 MED ORDER — PARICALCITOL 5 MCG/ML IV SOLN
2.0000 ug | INTRAVENOUS | Status: DC
  Administered 2012-02-24 – 2012-03-04 (×5): 2 ug via INTRAVENOUS
  Filled 2012-02-24 (×5): qty 0.4

## 2012-02-24 MED ORDER — ACETAMINOPHEN 650 MG RE SUPP: 650.0000 mg | Freq: Four times a day (QID) | RECTAL | Status: DC | PRN

## 2012-02-24 MED ORDER — OXYCODONE HCL 5 MG PO TABS: 5.0000 mg | ORAL_TABLET | ORAL | Status: DC | PRN

## 2012-02-24 MED ORDER — SODIUM CHLORIDE 0.9 % IV SOLN
250.0000 mL | INTRAVENOUS | Status: DC | PRN
  Administered 2012-02-28 – 2012-03-01 (×2): 250 mL via INTRAVENOUS

## 2012-02-24 MED ORDER — AMLODIPINE BESYLATE 10 MG PO TABS
10.0000 mg | ORAL_TABLET | Freq: Every day | ORAL | Status: DC
  Filled 2012-02-24: qty 1

## 2012-02-24 MED ORDER — DARBEPOETIN ALFA-POLYSORBATE 60 MCG/0.3ML IJ SOLN
60.0000 ug | INTRAMUSCULAR | Status: DC
  Administered 2012-03-02: 60 ug via INTRAVENOUS
  Filled 2012-02-24 (×2): qty 0.3

## 2012-02-24 MED ORDER — ONDANSETRON HCL 4 MG/2ML IJ SOLN: 4.0000 mg | Freq: Three times a day (TID) | INTRAMUSCULAR | Status: DC | PRN

## 2012-02-24 MED ORDER — SODIUM CHLORIDE 0.9 % IJ SOLN: 3.0000 mL | INTRAMUSCULAR | Status: DC | PRN

## 2012-02-24 MED ORDER — ONDANSETRON HCL 4 MG/2ML IJ SOLN: 4.0000 mg | Freq: Four times a day (QID) | INTRAMUSCULAR | Status: DC | PRN

## 2012-02-24 MED ORDER — ONDANSETRON HCL 4 MG PO TABS: 4.0000 mg | ORAL_TABLET | Freq: Four times a day (QID) | ORAL | Status: DC | PRN

## 2012-02-24 MED ORDER — AMLODIPINE BESYLATE 10 MG PO TABS
10.0000 mg | ORAL_TABLET | Freq: Every day | ORAL | Status: DC
  Administered 2012-02-25 – 2012-03-03 (×9): 10 mg via ORAL
  Filled 2012-02-24 (×12): qty 1

## 2012-02-24 NOTE — ED Notes (Signed)
Pt states that this am she began to have a cough.  When she went to cough up what was in her throat she noticed that it was bright red blood.  She has continued to cough up the blood with no relief.  No distress noted.  She is a dialysis pt M, W, Fri.  She has not been to dialysis today.  Resp symmetrical and unlabored.

## 2012-02-24 NOTE — ED Notes (Signed)
Pt woke up this am and has been coughing up blood.  Pt states that denies nightsweats, no unintentional weightloss.  Pt states that she had a cough for 2 days prior to the coughing up blood.  Pt denies feeling ill, she denies any weakness.  Pt denies being on bloodthinners (other than what may be given for her routine dialysis)
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
## 2012-02-24 NOTE — ED Notes (Signed)
Called to room pt was noted to be coughing up bright red blood.   Dr. Starr Sinclair notified and is coming to the ER to evaluate the pt.  Order given to upgrade to stepdown bed.  [id number] called and notified of bed cancel.  Flow manager notified of bed upgrade.
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
## 2012-02-25 ENCOUNTER — Encounter (HOSPITAL_COMMUNITY): Payer: Self-pay | Admitting: *Deleted

## 2012-02-25 DIAGNOSIS — Z6841 Body Mass Index (BMI) 40.0 and over, adult: Secondary | ICD-10-CM

## 2012-02-25 DIAGNOSIS — I2699 Other pulmonary embolism without acute cor pulmonale: Secondary | ICD-10-CM

## 2012-02-25 DIAGNOSIS — R011 Cardiac murmur, unspecified: Secondary | ICD-10-CM | POA: Diagnosis present

## 2012-02-25 LAB — BASIC METABOLIC PANEL
Calcium: 8.9 mg/dL (ref 8.4–10.5)
GFR calc Af Amer: 7 mL/min — ABNORMAL LOW (ref >90)
GFR calc non Af Amer: 6 mL/min — ABNORMAL LOW (ref >90)
Glucose, Bld: 106 mg/dL — ABNORMAL HIGH (ref 70–99)
Potassium: 3.9 mEq/L (ref 3.5–5.1)
Sodium: 136 mEq/L (ref 135–145)

## 2012-02-25 LAB — CBC
Hemoglobin: 11 g/dL — ABNORMAL LOW (ref 12.0–15.0)
MCH: 27.7 pg (ref 26.0–34.0)
MCHC: 34 g/dL (ref 30.0–36.0)
MCV: 81.6 fL (ref 78.0–100.0)
RBC: 3.97 MIL/uL (ref 3.87–5.11)

## 2012-02-25 LAB — HEPARIN LEVEL (UNFRACTIONATED): Heparin Unfractionated: 0.31 IU/mL (ref 0.30–0.70)

## 2012-02-25 MED ORDER — INFLUENZA VIRUS VACC SPLIT PF IM SUSP
0.2500 mL | INTRAMUSCULAR | Status: AC
  Filled 2012-02-25: qty 0.25

## 2012-02-25 MED ORDER — HEPARIN (PORCINE) IN NACL 100-0.45 UNIT/ML-% IJ SOLN
1650.0000 [IU]/h | INTRAMUSCULAR | Status: DC
Start: 2012-02-25 — End: 2012-03-04
  Administered 2012-02-25: 1450 [IU]/h via INTRAVENOUS
  Administered 2012-02-27 – 2012-02-29 (×3): 1650 [IU]/h via INTRAVENOUS
  Administered 2012-03-01: 1750 [IU]/h via INTRAVENOUS
  Administered 2012-03-01: 1900 [IU]/h via INTRAVENOUS
  Administered 2012-03-02: 1800 [IU]/h via INTRAVENOUS
  Administered 2012-03-03 (×2): 1750 [IU]/h via INTRAVENOUS
  Administered 2012-03-04: 1650 [IU]/h via INTRAVENOUS
  Filled 2012-02-25 (×16): qty 250

## 2012-02-25 MED ORDER — RIVAROXABAN 15 MG PO TABS: 15.0000 mg | ORAL_TABLET | Freq: Every day | ORAL | Status: DC

## 2012-02-25 MED ORDER — HEPARIN (PORCINE) IN NACL 100-0.45 UNIT/ML-% IJ SOLN
1350.0000 [IU]/h | INTRAMUSCULAR | Status: DC
  Filled 2012-02-25 (×2): qty 250

## 2012-02-25 NOTE — Progress Notes (Signed)
ANTICOAGULATION CONSULT NOTE:  FOLLOW UP  Pharmacy Consult:  Heparin Indication: pulmonary embolus  Allergies  Allergen Reactions  . Zolpidem Tartrate Other (See Comments)    hallucinations    Patient Measurements: Height: 5\' 3"  (160 cm) Weight: 278 lb 14.1 oz (126.5 kg) IBW/kg (Calculated) : 52.4  Heparin Dosing Weight: 86 kg  Vital Signs: Temp: 98.8 F (37.1 C) (10/22 0747) Temp src: Oral (10/22 0747) BP: 135/96 mmHg (10/22 0747) Pulse Rate: 98  (10/22 0747)  Labs:  Basename 02/25/12 0535 02/25/12 0030 02/24/12 1839 02/24/12 1155 02/24/12 1118  HGB 11.0* -- 10.1* -- --  HCT 32.4* -- 30.4* -- 33.9*  PLT 169 -- -- -- 202  APTT -- -- -- -- --  LABPROT -- -- -- 13.4 --  INR -- -- -- 1.03 --  HEPARINUNFRC 0.31 0.71* -- -- --  CREATININE 8.01* -- -- 14.49* --  CKTOTAL -- -- -- -- --  CKMB -- -- -- -- --  TROPONINI -- -- -- -- --    Estimated Creatinine Clearance: 12.7 ml/min (by C-G formula based on Cr of 8.01).      Assessment: 43 YOF with PE on IV heparin.  RN documented 3 episodes of bright red hemoptysis.  MD/PA aware of hemoptysis and wishes to continue with anticoagulation.  Hemoglobin and platelets trended down, but remain in acceptable ranges.  Heparin level is therapeutic but is toward the low of end of goal.   Goal of Therapy:  Heparin level 0.3-0.7 units/ml Monitor platelets by anticoagulation protocol: Yes     Plan:  - Increase heparin gtt slightly to 1350 units/hr - Daily HL / CBC - Watch hgb closely and recommend to d/c Aranesp if hgb persistently >11 g/dL - Continue to monitor for bleeding - F/U initiation of oral anticoagulation when appropriate     Yarielis Funaro D. Laney Potash, PharmD, BCPS Pager:  [phone] 02/25/2012, 9:01 AM

## 2012-02-25 NOTE — Progress Notes (Signed)
Contacted MD to inform pt refusal for echo study

## 2012-02-25 NOTE — Progress Notes (Signed)
Called report to [facility] for pt transport.  Pt was alert and oriented prior to transport and there were no questions that were unanswered prior to transport.  Pt vitals were documented in doc flow sheets.  Pt left unit with doppler study transport technician at 1147, with the understanding that post procedure she would be transported to [facility] aware).  Belongings were carried with pt.
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
## 2012-02-25 NOTE — Progress Notes (Signed)
Endo called reporting that pt is refusing to have ECHO done. Pt states that test is for murmur and she "does not have one". Dr Butler Denmark notified via text page. Pt not yet received to our unit. [place], Rosanna Randy

## 2012-02-25 NOTE — Progress Notes (Signed)
Utilization Review Completed.  Sarp Vernier, [place] T  02/25/2012

## 2012-02-25 NOTE — Progress Notes (Signed)
Pt received from ENDO. Pt alert and oriented to room. No distress noted or complaints of pain/distress. Bed low, call light within reach. Kristine Rios, Kristine Rios

## 2012-02-25 NOTE — Progress Notes (Signed)
ANTICOAGULATION CONSULT NOTE: FOLLOW UP Heparin Indication: pulmonary embolus  Heparin level = 0.27 on 1350 units/hr Goal heparin level= 0.3-0.7  Heparin level is subtherapeutic. Will increase dose to 1450 units/hr. Pt has had several episodes of hemoptysis but doing OK right now. Resume daily heparin level and CBC.  Cardell Peach, PharmD

## 2012-02-25 NOTE — ED Provider Notes (Signed)
CRITICAL CARE Performed by: Donnetta Hutching   Total critical care time: 30  Critical care time was exclusive of separately billable procedures and treating other patients.  Critical care was necessary to treat or prevent imminent or life-threatening deterioration.  Critical care was time spent personally by me on the following activities: development of treatment plan with patient and/or surrogate as well as nursing, discussions with consultants, evaluation of patient's response to treatment, examination of patient, obtaining history from patient or surrogate, ordering and performing treatments and interventions, ordering and review of laboratory studies, ordering and review of radiographic studies, pulse oximetry and re-evaluation of patient's condition.   Medical screening examination/treatment/procedure(s) were conducted as a shared visit with non-physician practitioner(s) and myself.  I personally evaluated the patient during the encounter.     Hemoptysis with end-stage renal disease..  CT scan of the chest suggests pulmonary embolus. Will admit.  Hemodynamically stable  Donnetta Hutching, MD 02/25/12 0900

## 2012-02-25 NOTE — Progress Notes (Signed)
Pt has had 3 noted episodes of bright red hemoptysis this shift. When back from dialysis she says the amount has decreased while there. Will continue to monitor.

## 2012-02-25 NOTE — Progress Notes (Signed)
ANTICOAGULATION CONSULT NOTE   Pharmacy Consult for heparin Indication: pulmonary embolus  Allergies  Allergen Reactions  . Zolpidem Tartrate Other (See Comments)    hallucinations    Patient Measurements: Height: 5\' 3"  (160 cm) Weight: 278 lb 14.1 oz (126.5 kg) IBW/kg (Calculated) : 52.4  Heparin Dosing Weight: 86.3kg  Vital Signs: Temp: 98.4 F (36.9 C) (10/22 0041) Temp src: Oral (10/22 0041) BP: 124/74 mmHg (10/22 0041) Pulse Rate: 107  (10/22 0041)  Labs:  Basename 02/25/12 0030 02/24/12 1839 02/24/12 1155 02/24/12 1118  HGB -- 10.1* -- 11.6*  HCT -- 30.4* -- 33.9*  PLT -- -- -- 202  APTT -- -- -- --  LABPROT -- -- 13.4 --  INR -- -- 1.03 --  HEPARINUNFRC 0.71* -- -- --  CREATININE -- -- 14.49* --  CKTOTAL -- -- -- --  CKMB -- -- -- --  TROPONINI -- -- -- --    Estimated Creatinine Clearance: 7 ml/min (by C-G formula based on Cr of 14.49).  Assessment: 36 yo female with PE for Heparin.   Goal of Therapy:  Heparin level 0.3-0.7 units/ml Monitor platelets by anticoagulation protocol: Yes   Plan:  Continue Heparin at current rate as heparin level likely to decrease following bolus. Follow-up am labs.  Kristine Rios, Gary Fleet 02/25/2012,1:25 AM

## 2012-02-25 NOTE — Progress Notes (Signed)
*  Preliminary Results* Bilateral lower extremity venous duplex completed. Technically difficult study due to patient body habitus and depth of vessels. Unable to discern lower extremity deep vein thrombosis bilaterally due to limited visualization of most lower extremity veins, therefore study is inconclusive.  02/25/2012 12:18 PM Gertie Fey, RDMS, RDCS

## 2012-02-25 NOTE — Progress Notes (Signed)
Nutrition Brief Note  Patient identified on the Malnutrition Screening Tool (MST) Report. This RD discussed nutrition hx with patient, she notes that her weight has been stable and intake appropriate. Follows a renal diet at home, especially watches her potassium per her report.  Body mass index is 49.40 kg/(m^2). Pt meets criteria for Obesity Class III (Extreme) based on current BMI.   Current diet order is Renal 60 - 70, pt reports intake is adequate at this time. Labs and medications reviewed.   No nutrition interventions warranted at this time. If nutrition issues arise, please consult RD.   Jarold Motto MS, RD, LDN Pager: [phone] After-hours pager: [phone]

## 2012-02-26 ENCOUNTER — Encounter (HOSPITAL_COMMUNITY): Admission: RE | Payer: Self-pay | Source: Ambulatory Visit

## 2012-02-26 ENCOUNTER — Inpatient Hospital Stay (HOSPITAL_COMMUNITY): Payer: Medicare Other

## 2012-02-26 ENCOUNTER — Ambulatory Visit (HOSPITAL_COMMUNITY): Admission: RE | Admit: 2012-02-26 | Payer: Medicare Other | Source: Ambulatory Visit | Admitting: Surgery

## 2012-02-26 LAB — LUPUS ANTICOAGULANT PANEL
Lupus Anticoagulant: NOT DETECTED
PTTLA 4:1 Mix: 66.2 secs — ABNORMAL HIGH (ref 28.0–43.0)
dRVVT Incubated 1:1 Mix: 37 secs (ref <42.9)

## 2012-02-26 LAB — CBC
HCT: 32.3 % — ABNORMAL LOW (ref 36.0–46.0)
Hemoglobin: 10.8 g/dL — ABNORMAL LOW (ref 12.0–15.0)
RBC: 3.92 MIL/uL (ref 3.87–5.11)

## 2012-02-26 LAB — RENAL FUNCTION PANEL
Albumin: 2.9 g/dL — ABNORMAL LOW (ref 3.5–5.2)
BUN: 54 mg/dL — ABNORMAL HIGH (ref 6–23)
CO2: 23 mEq/L (ref 19–32)
Calcium: 9.1 mg/dL (ref 8.4–10.5)
Chloride: 94 mEq/L — ABNORMAL LOW (ref 96–112)
Creatinine, Ser: 11.38 mg/dL — ABNORMAL HIGH (ref 0.50–1.10)
GFR calc Af Amer: 4 mL/min — ABNORMAL LOW (ref >90)
GFR calc non Af Amer: 4 mL/min — ABNORMAL LOW (ref >90)
Glucose, Bld: 108 mg/dL — ABNORMAL HIGH (ref 70–99)
Phosphorus: 9.4 mg/dL — ABNORMAL HIGH (ref 2.3–4.6)
Potassium: 4.2 mEq/L (ref 3.5–5.1)
Sodium: 135 mEq/L (ref 135–145)

## 2012-02-26 LAB — BETA-2-GLYCOPROTEIN I ABS, IGG/M/A
Beta-2 Glyco I IgG: 14 G Units (ref <20)
Beta-2-Glycoprotein I IgA: 5 A Units (ref <20)

## 2012-02-26 LAB — CARDIOLIPIN ANTIBODIES, IGG, IGM, IGA
Anticardiolipin IgA: 24 APL U/mL — ABNORMAL HIGH (ref <22)
Anticardiolipin IgM: 9 MPL U/mL — ABNORMAL LOW (ref <11)

## 2012-02-26 LAB — HEPARIN LEVEL (UNFRACTIONATED)
Heparin Unfractionated: 0.26 IU/mL — ABNORMAL LOW (ref 0.30–0.70)
Heparin Unfractionated: 0.43 IU/mL (ref 0.30–0.70)

## 2012-02-26 SURGERY — VENOGRAM
Anesthesia: LOCAL

## 2012-02-26 MED ORDER — RENA-VITE PO TABS
1.0000 | ORAL_TABLET | Freq: Every day | ORAL | Status: DC
Start: 2012-02-26 — End: 2012-03-04
  Administered 2012-02-27 – 2012-03-03 (×7): 1 via ORAL
  Filled 2012-02-26 (×9): qty 1

## 2012-02-26 MED ORDER — COUMADIN BOOK
Freq: Once | Status: AC
  Administered 2012-02-26: 19:00:00
  Filled 2012-02-26: qty 1

## 2012-02-26 MED ORDER — WARFARIN VIDEO: Freq: Once | Status: DC

## 2012-02-26 MED ORDER — HEPARIN SODIUM (PORCINE) 1000 UNIT/ML DIALYSIS: 20.0000 [IU]/kg | Freq: Once | INTRAMUSCULAR | Status: DC

## 2012-02-26 MED ORDER — WARFARIN SODIUM 7.5 MG PO TABS
7.5000 mg | ORAL_TABLET | Freq: Once | ORAL | Status: DC
  Filled 2012-02-26: qty 1

## 2012-02-26 MED ORDER — WARFARIN - PHARMACIST DOSING INPATIENT: Freq: Every day | Status: DC

## 2012-02-26 MED ORDER — PARICALCITOL 5 MCG/ML IV SOLN
INTRAVENOUS | Status: AC
  Filled 2012-02-26: qty 1

## 2012-02-26 NOTE — Progress Notes (Signed)
ANTICOAGULATION CONSULT NOTE - Follow Up Consult  Pharmacy Consult for Heparin Indication: pulmonary embolus  Allergies  Allergen Reactions  . Zolpidem Tartrate Other (See Comments)    hallucinations    Patient Measurements: Height: 5\' 3"  (160 cm) Weight: 292 lb 5.3 oz (132.6 kg) IBW/kg (Calculated) : 52.4  Heparin Dosing Weight: 86.2kg  Vital Signs: Temp: 98 F (36.7 C) (10/23 1830) Temp src: Oral (10/23 1830) BP: 127/72 mmHg (10/23 1830) Pulse Rate: 89  (10/23 1830)  Labs:  Basename 02/26/12 1749 02/26/12 0910 02/26/12 0615 02/25/12 0535 02/24/12 1839 02/24/12 1155 02/24/12 1118  HGB -- -- 10.8* 11.0* -- -- --  HCT -- -- 32.3* 32.4* 30.4* -- --  PLT -- -- 157 169 -- -- 202  APTT -- -- -- -- -- -- --  LABPROT -- -- -- -- -- 13.4 --  INR -- -- -- -- -- 1.03 --  HEPARINUNFRC 0.43 1.51* 0.26* -- -- -- --  CREATININE -- 11.38* -- 8.01* -- 14.49* --  CKTOTAL -- -- -- -- -- -- --  CKMB -- -- -- -- -- -- --  TROPONINI -- -- -- -- -- -- --    Estimated Creatinine Clearance: 9.2 ml/min (by C-G formula based on Cr of 11.38).   Medications:  Heparin 1450 units/hr   Assessment: 36yof on heparin for CTA suspected PE. Heparin level (0.43) is therapeutic. Prior level of 1.51 was drawn early (only 1 hr after rate change) and in error. Per RN no further hemoptysis/bleeding (only 1 drop per patient prior) and reports no problems with line/infusion.  - H/H and Plts was trending down this am - No significant bleeding reported  Goal of Therapy:  Heparin level 0.3-0.7 units/ml Monitor platelets by anticoagulation protocol: Yes   Plan:  1. Continue heparin drip at 1650 units/hr (16.5 ml/hr) 2. Follow-up AM heparin level to confirm.   3. Monitor for s/sx of bleeding  Link Snuffer, PharmD, BCPS Clinical Pharmacist [phone] 02/26/2012,6:54 PM

## 2012-02-26 NOTE — Progress Notes (Signed)
[facility] Progress Note [place] TEAM 1 - Stepdown/ICU TEAM   Kristine Rios ZOX:[id number] DOB: Sep 24, 1975 DOA: 02/24/2012 PCP: Kathreen Cosier, MD  Brief narrative: 36 year old female patient on chronic dialysis who presented to the emergency department after developing hemoptysis. She endorsed nonproductive cough for 2 days prior to this. No other significant symptoms such a shortness of breath, leg swelling, chest pain. She also was not taking any anticoagulant medications or utilizing any antiplatelet drugs. In the emergency department chest x-ray demonstrated mild edema. CT angiography of the chest was completed and this demonstrated high index of suspicion for left lower lobe pulmonary embolus. After she presented to the emergency department her hemoptysis spontaneously resolved but after the heparin drip was initiated in the emergency department her hemoptysis recurred. She was otherwise hemodynamically stable and her hemoglobin was stable. She was subsequently admitted to the step down unit for further monitoring and treatment  Assessment/Plan: Principal Problem:  *Acute pulmonary embolism *Likely related to patient's obesity and suspected underlying venous insufficiency *Patient has a history of multiple clotted dialysis access grafts and given her presentation with pulmonary embolism this is concerning for possible underlying hypercoagulable disorder therefore we will begin workup here and check serologies. Patient likely will need formal hematological evaluation after complete 6 months of anticoagulation *Lower extremity Doppler studies were completed but were inconclusive due to patient's body habitus * ECHO ordered to evaluate for PFO- pt refused.     CKD (chronic kidney disease) stage V requiring chronic dialysis - HD M/W/F  - venogram 10/24   Hyperkalemia *Improved after treatment   Systolic murmur *Patient states has had a murmur for some time *Echocardiogram  was ordered but patient refused   Morbid obesity with BMI of 50.0-59.9, adult *Suspect primary contributing factor to patient's embolism   DVT prophylaxis: On full dose heparin Code Status: Full Family Communication: Spoke with patient Disposition Plan: home once INR>2  Consultants: Nephrology  Procedures: None  Antibiotics: None  HPI/Subjective: No further hemoptysis    Objective: Blood pressure 130/75, pulse 95, temperature 98.1 F (36.7 C), temperature source Oral, resp. rate 16, height 5\' 3"  (1.6 m), weight 132.6 kg (292 lb 5.3 oz), last menstrual period 02/20/2012, SpO2 96.00%.  Intake/Output Summary (Last 24 hours) at 02/26/12 1553 Last data filed at 02/26/12 1348  Gross per 24 hour  Intake 887.78 ml  Output   2700 ml  Net -1812.22 ml     Exam: General: No acute respiratory distress, obese Lungs: Clear to auscultation bilaterally without wheezes or crackles, on room air Cardiovascular: Regular rate and rhythm without murmur gallop or rub normal S1 and S2 Abdomen: Nontender, nondistended, soft, bowel sounds positive, no rebound, no ascites, no appreciable mass   Data Reviewed: Basic Metabolic Panel:  Lab 02/26/12 8119 02/25/12 0535 02/24/12 1155  NA 135 136 137  K 4.2 3.9 5.3*  CL 94* 96 97  CO2 23 24 16*  GLUCOSE 108* 106* 98  BUN 54* 36* 95*  CREATININE 11.38* 8.01* 14.49*  CALCIUM 9.1 8.9 8.7  MG -- -- --  PHOS 9.4* -- --   Liver Function Tests:  Lab 02/26/12 0910  AST --  ALT --  ALKPHOS --  BILITOT --  PROT --  ALBUMIN 2.9*   No results found for this basename: LIPASE:5,AMYLASE:5 in the last 168 hours No results found for this basename: AMMONIA:5 in the last 168 hours CBC:  Lab 02/26/12 0615 02/25/12 0535 02/24/12 1839 02/24/12 1118  WBC 6.5 6.9 -- 8.8  NEUTROABS -- -- -- 6.8  HGB 10.8* 11.0* 10.1* 11.6*  HCT 32.3* 32.4* 30.4* 33.9*  MCV 82.4 81.6 -- 82.3  PLT 157 169 -- 202   Cardiac Enzymes: No results found for this  basename: CKTOTAL:5,CKMB:5,CKMBINDEX:5,TROPONINI:5 in the last 168 hours BNP (last 3 results) No results found for this basename: PROBNP:3 in the last 8760 hours CBG: No results found for this basename: GLUCAP:5 in the last 168 hours  Recent Results (from the past 240 hour(s))  MRSA PCR SCREENING     Status: Normal   Collection Time   02/24/12  6:38 PM      Component Value Range Status Comment   MRSA by PCR NEGATIVE  NEGATIVE Final      Studies:  Recent x-ray studies have been reviewed in detail by the Attending Physician  Scheduled Meds:     . amLODipine  10 mg Oral QHS  . cinacalcet  90 mg Oral QODAY  . coumadin book   Does not apply Once  . darbepoetin  60 mcg Intravenous Q7 days  . heparin  20 Units/kg Dialysis Once in dialysis  . influenza  inactive virus vaccine  0.25 mL Intramuscular Tomorrow-1000  . lanthanum  2,000 mg Oral TID WC  . lisinopril  20 mg Oral q morning - 10a  . lisinopril  40 mg Oral QHS  . multivitamin  1 tablet Oral QHS  . paricalcitol      . paricalcitol  2 mcg Intravenous 3 times weekly  . sodium chloride  3 mL Intravenous Q12H  . DISCONTD: warfarin  7.5 mg Oral ONCE-1800  . DISCONTD: warfarin   Does not apply Once  . DISCONTD: Warfarin - Pharmacist Dosing Inpatient   Does not apply q1800      [facility]  [facility] Office  [phone] Pager [phone]  On-Call/Text Page:      [email or web address]      password [email or web address]  If 7PM-7AM, please contact night-coverage [email or web address] Password [email or web address] 02/26/2012, 3:53 PM   LOS: 2 days

## 2012-02-26 NOTE — Progress Notes (Addendum)
[facility] Progress Note  Subjective: Breathing easily. Slight dry cough. No hemoptysis.  Objective Filed Vitals:   02/25/12 2023 02/26/12 0457 02/26/12 0750 02/26/12 0906  BP: 133/76 148/83 132/62 148/84  Pulse: 92 77 85 81  Temp: 98.7 F (37.1 C) 97.8 F (36.6 C) 97.7 F (36.5 C) 97.8 F (36.6 C)  TempSrc: Oral Oral Oral Oral  Resp: 20 18 18 18   Height:      Weight: 134.6 kg (296 lb 11.8 oz)  BED SCALE   127.7 kg (281 lb 8.4 oz) STANDING pre HD  SpO2: 94% 94% 95% 90%   Physical Exam General: comfortable on HD Heart: RRR Lungs: crackles left base otherwise clear Abdomen: obese soft Extremities: no significant LE edema, but obese Dialysis Access:   OUTPATIENT HD: MWF, 4.5 hr, [facility], 126.5kg EDW. Zemplar 2ug, EPO 7800u, IV iron q Wed. Heparin 13000u then 2000 mid Rx. 2.5Ca/2.0K bath. L IJ TDC.   Assessment/Plan: 1. Pulmonary embolism w hemoptysis- no further hemoptysis overnight, on IV hep. No dyspnea or hypoxemia. W/U per primary underway. Unable to do LE venous dopplers due to technical factors. Discussed with radiology - they reviewed the CT angio of chest with regards to possible cath-related PE and they did not think that catheter was a likely source of PE. I have discussed with Dr. Lavera Guise and we will be glad to monitor INR after discharge at her dialysis center. 2. ESRD, hd mwf. - HD today; standing weight in HD 127.7; wt yesterday of 134.5 is a bed scale weight and most likely an error; needs permanent access - has clotted multiple accesses in the past - don't know that she has ever had a hypercoagulable work up. VVS consulted for permanent access during hospital stay. Dr. Imogene Burn will see patient.  3. HTN.vol- cont lisinopril 40pm/20am, amlodipine 10/hs as at home. 4 kg off w hd Monday.  Pre HD BPs today are MUCH better than outpt setting reflective of better volume control.  Have changed total goal to 3.5 liters. Lowering EDW. This has been attempted in the outpt  setting, but hasn't even been achieving EDW due to large IDWG and pt signing off during treatments. 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 Last PTH 136. Renal panel pending 5. Anemia of CKD- cont Aranesp 60, last tsat 41% with ferritin in 1600s - no IV Fe . Hgb stable at 10.8 6. Morbid obesity/Nutrition - change to high protein renal diet and added multivitamin   Sheffield Slider, PA-C  [facility] [phone]  02/26/2012,9:13 AM  LOS: 2 days   Patient seen and examined and agree with assessment and plan as above with additions as indicated. Doing better with anticoagulation. No further hemoptysis. Plan as above. Vinson Moselle  MD [facility] [phone] pgr    [phone] cell 02/26/2012, 10:43 AM  Addendum   Audree Bane, [facility]  02/26/2012 1:59 pm   Additional Objective Labs: Lab Results  Component Value Date   INR 1.03 02/24/2012    Basic Metabolic Panel:  Lab 02/25/12 6433 02/24/12 1155  NA 136 137  K 3.9 5.3*  CL 96 97  CO2 24 16*  GLUCOSE 106* 98  BUN 36* 95*  CREATININE 8.01* 14.49*  CALCIUM 8.9 8.7  ALB -- --  PHOS -- --  CBC:  Lab 02/26/12 0615 02/25/12 0535 02/24/12 1839 02/24/12 1118  WBC 6.5 6.9 -- 8.8  NEUTROABS -- -- -- 6.8  HGB 10.8* 11.0* 10.1* --  HCT 32.3* 32.4* 30.4* --  MCV 82.4 81.6 -- 82.3  PLT 157 169 -- 202   Medications:    . heparin 1,450 Units/hr (02/25/12 1812)  . DISCONTD: heparin 1,350 Units/hr (02/25/12 0955)      . amLODipine  10 mg Oral QHS  . cinacalcet  90 mg Oral QODAY  . darbepoetin  60 mcg Intravenous Q7 days  . heparin  20 Units/kg Dialysis Once in dialysis  . influenza  inactive virus vaccine  0.25 mL Intramuscular Tomorrow-1000  . lanthanum  2,000 mg Oral TID WC  . lisinopril  20 mg Oral q morning - 10a  . lisinopril  40 mg Oral QHS  . paricalcitol  2 mcg Intravenous 3 times weekly  . sodium chloride  3 mL Intravenous Q12H  . DISCONTD:  rivaroxaban  15 mg Oral Daily

## 2012-02-26 NOTE — Procedures (Signed)
I was present at this dialysis session. I have reviewed the session itself and made appropriate changes.   Vinson Moselle, MD [employer] 02/26/2012, 1:32 PM

## 2012-02-26 NOTE — Progress Notes (Signed)
Subjective: Feeling fine, no sob, no more hemoptysis.   Objective Vital signs in last 24 hours: Filed Vitals:   02/25/12 1258 02/25/12 1741 02/25/12 2023 02/26/12 0457  BP: 154/101 129/75 133/76 148/83  Pulse: 109 102 92 77  Temp: 97.9 F (36.6 C) 98.4 F (36.9 C) 98.7 F (37.1 C) 97.8 F (36.6 C)  TempSrc: Oral Oral Oral Oral  Resp: 22 20 20 18   Height:      Weight:   134.6 kg (296 lb 11.8 oz)   SpO2: 96% 99% 94% 94%   Weight change: 2.4 kg (5 lb 4.7 oz)  Intake/Output Summary (Last 24 hours) at 02/26/12 0831 Last data filed at 02/26/12 0700  Gross per 24 hour  Intake 650.78 ml  Output      0 ml  Net 650.78 ml   Labs: Basic Metabolic Panel:  Lab 02/25/12 1610 02/24/12 1155  NA 136 137  K 3.9 5.3*  CL 96 97  CO2 24 16*  GLUCOSE 106* 98  BUN 36* 95*  CREATININE 8.01* 14.49*  ALB -- --  CALCIUM 8.9 8.7  PHOS -- --   Liver Function Tests: No results found for this basename: AST:3,ALT:3,ALKPHOS:3,BILITOT:3,PROT:3,ALBUMIN:3 in the last 168 hours No results found for this basename: LIPASE:3,AMYLASE:3 in the last 168 hours No results found for this basename: AMMONIA:3 in the last 168 hours CBC:  Lab 02/26/12 0615 02/25/12 0535 02/24/12 1839 02/24/12 1118  WBC 6.5 6.9 -- 8.8  NEUTROABS -- -- -- 6.8  HGB 10.8* 11.0* 10.1* 11.6*  HCT 32.3* 32.4* 30.4* 33.9*  MCV 82.4 81.6 -- 82.3  PLT 157 169 -- 202    Physical Exam:  Blood pressure 148/83, pulse 77, temperature 97.8 F (36.6 C), temperature source Oral, resp. rate 18, height 5\' 3"  (1.6 m), weight 134.6 kg (296 lb 11.8 oz), last menstrual period 02/20/2012, SpO2 94.00%.  Gen: alert, morbidly obese AAF no distress  Skin: no rash, cyanosis  HEENT: EOMI, sclera anicteric, throat clear  Neck: no JVD, no bruits or LAN  Chest: Clear bilat, no rales or wheezing. L chest TDC  Heart: regular, no rub or gallop, soft SEM LLSB  Abdomen: soft, obese, NT/ND, active BS  Ext: no LE edema or erythema, old accesses LUA, RUA  and L forearm without signs of infection  Neuro: alert, Ox3, no focal deficit, no asterixis   OUTPATIENT HD: MWF, 4.5 hr, [facility], 126.5kg EDW. Zemplar 2ug, EPO 7800u, IV iron q Wed. Heparin 13000u then 2000 mid Rx. 2.5Ca/2.0K bath. L IJ TDC.   Impression/Plan  1. Pulmonary embolism w hemoptysis- no further hemoptysis overnight, on IV hep. No dyspnea or hypoxemia. W/U per primary underway. 2. CKD, hd mwf.  3. HTN.vol- cont lisinopril 40pm/20am, amlodipine 10/hs as at home. 4 kg off w hd Monday, up 8kg by weights. Max UF w HD tomorrow. 4. MBD- cont CaCO3 3ac and fosrenol 1gm ac, sensipar 90/d. Last PTH 136.  5. Anemia of CKD- cont epo, weekly IV iron 6. Obesity 7. Access- needs new perm access  Vinson Moselle  MD [facility] [phone] pgr    [phone] cell 02/26/2012, 8:31 AM
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
## 2012-02-26 NOTE — Progress Notes (Addendum)
ANTICOAGULATION CONSULT NOTE - Follow Up Consult  Pharmacy Consult for Heparin Indication: pulmonary embolus  Allergies  Allergen Reactions  . Zolpidem Tartrate Other (See Comments)    hallucinations    Patient Measurements: Height: 5\' 3"  (160 cm) Weight: 296 lb 11.8 oz (134.6 kg) IBW/kg (Calculated) : 52.4  Heparin Dosing Weight: 86.2kg  Vital Signs: Temp: 97.7 F (36.5 C) (10/23 0750) Temp src: Oral (10/23 0750) BP: 132/62 mmHg (10/23 0750) Pulse Rate: 85  (10/23 0750)  Labs:  Basename 02/26/12 0615 02/25/12 1608 02/25/12 0535 02/24/12 1839 02/24/12 1155 02/24/12 1118  HGB 10.8* -- 11.0* -- -- --  HCT 32.3* -- 32.4* 30.4* -- --  PLT 157 -- 169 -- -- 202  APTT -- -- -- -- -- --  LABPROT -- -- -- -- 13.4 --  INR -- -- -- -- 1.03 --  HEPARINUNFRC 0.26* 0.27* 0.31 -- -- --  CREATININE -- -- 8.01* -- 14.49* --  CKTOTAL -- -- -- -- -- --  CKMB -- -- -- -- -- --  TROPONINI -- -- -- -- -- --    Estimated Creatinine Clearance: 13.2 ml/min (by C-G formula based on Cr of 8.01).   Medications:  Heparin 1450 units/hr   Assessment: 36yof on heparin for CTA suspected PE. Heparin level (0.26) is subtherapeutic despite rate increases. RN reports no further hemoptysis/bleeding and reports no problems with line/infusion.  - H/H and Plts trending down - No significant bleeding reported  Goal of Therapy:  Heparin level 0.3-0.7 units/ml Monitor platelets by anticoagulation protocol: Yes   Plan:  1. Increase heparin drip to 1650 units/hr (16.5 ml/hr) 2. Check heparin level 8 hours after rate increase 3. Monitor for s/sx of bleeding  Cleon Dew [phone] 02/26/2012,9:07 AM   Addendum:  Received verbal order from Dr. Lavera Guise to start Coumadin for VTE treatment tonight (Day 1 of minimum 5 Day overlap).  - Baseline INR: 1.03  Goal INR: 2-3  Plan: 1. Coumadin 7.5mg  po x 1 today 2. Daily INR 3. Coumadin educational book/video  Cleon Dew,  PharmD [phone] 02/26/2012   9:34 AM

## 2012-02-27 ENCOUNTER — Encounter (HOSPITAL_COMMUNITY)
Admission: EM | Disposition: A | Discharge status: Home / Self Care | Payer: Self-pay | Source: Home / Self Care | Attending: Internal Medicine

## 2012-02-27 ENCOUNTER — Ambulatory Visit (HOSPITAL_COMMUNITY): Admit: 2012-02-27 | Payer: Self-pay | Admitting: Vascular Surgery

## 2012-02-27 ENCOUNTER — Telehealth: Payer: Self-pay | Admitting: Surgery

## 2012-02-27 DIAGNOSIS — N186 End stage renal disease: Secondary | ICD-10-CM

## 2012-02-27 HISTORY — PX: VENOGRAM: SHX5497

## 2012-02-27 LAB — CBC
MCV: 83.7 fL (ref 78.0–100.0)
Platelets: 179 10*3/uL (ref 150–400)
RBC: 4.23 MIL/uL (ref 3.87–5.11)
RDW: 15.4 % (ref 11.5–15.5)
WBC: 6.9 10*3/uL (ref 4.0–10.5)

## 2012-02-27 LAB — PROTIME-INR
INR: 1.03 (ref 0.00–1.49)
Prothrombin Time: 13.4 seconds (ref 11.6–15.2)

## 2012-02-27 LAB — BASIC METABOLIC PANEL
CO2: 23 mEq/L (ref 19–32)
Calcium: 8.8 mg/dL (ref 8.4–10.5)
Creatinine, Ser: 7.04 mg/dL — ABNORMAL HIGH (ref 0.50–1.10)
GFR calc Af Amer: 8 mL/min — ABNORMAL LOW (ref >90)
GFR calc non Af Amer: 7 mL/min — ABNORMAL LOW (ref >90)
Sodium: 138 mEq/L (ref 135–145)

## 2012-02-27 LAB — HCG, SERUM, QUALITATIVE: Preg, Serum: NEGATIVE

## 2012-02-27 SURGERY — VENOGRAM
Anesthesia: LOCAL | Laterality: Bilateral

## 2012-02-27 MED ORDER — WARFARIN SODIUM 7.5 MG PO TABS
7.5000 mg | ORAL_TABLET | Freq: Once | ORAL | Status: AC
  Administered 2012-02-27: 7.5 mg via ORAL
  Filled 2012-02-27: qty 1

## 2012-02-27 MED ORDER — INFLUENZA VIRUS VACC SPLIT PF IM SUSP
0.5000 mL | INTRAMUSCULAR | Status: AC
  Filled 2012-02-27: qty 0.5

## 2012-02-27 MED ORDER — WARFARIN - PHARMACIST DOSING INPATIENT
Freq: Every day | Status: DC
  Administered 2012-02-27 – 2012-03-04 (×4)
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
## 2012-02-27 NOTE — Op Note (Signed)
[facility]  Patient name: Kristine Rios MRN: [id number] DOB: [birth year] Sex: female  02/24/2012 - 02/27/2012 Pre-operative Diagnosis: ESRD Post-operative diagnosis:  Same Surgeon:  Jorge Ny Procedure Performed:  1.  Bilateral upper extremity venogram   Indications:  Patient needs her central venous system for access planning  Procedure:  The patient was identified in the holding area and taken to room 8.  The patient was then placed supine on the table.  Contrast injections were performed throught previously placed IV's  Findings:  The left axillary and subclavian veins are widely patent.  The rest of the central venous system in widely patent.  The right brachial and axillary, and subclavian veins are widely patent.  The central venous system is widely patent.  A left sided catheter is in good position    Impression:  #1  Bilateral central venograms show no evidence of central venous stenosis     V. Durene Cal, M.D. [facility] Office: [phone] Pager:  [phone]

## 2012-02-27 NOTE — Progress Notes (Signed)
[facility] Progress Note    Kristine Rios LKG:[id number] DOB: 08/16/1975 DOA: 02/24/2012 PCP: Kathreen Cosier, MD  Brief narrative: 36 year old female patient on chronic dialysis who presented to the emergency department after developing hemoptysis. She endorsed nonproductive cough for 2 days prior to this. No other significant symptoms such a shortness of breath, leg swelling, chest pain. She also was not taking any anticoagulant medications or utilizing any antiplatelet drugs. In the emergency department chest x-ray demonstrated mild edema. CT angiography of the chest was completed and this demonstrated high index of suspicion for left lower lobe pulmonary embolus. After she presented to the emergency department her hemoptysis spontaneously resolved but after the heparin drip was initiated in the emergency department her hemoptysis recurred. She was otherwise hemodynamically stable and her hemoglobin was stable. She was subsequently admitted to the step down unit for further monitoring and treatment  Assessment/Plan: Principal Problem:  *Acute pulmonary embolism *Likely related to patient's obesity and suspected underlying venous insufficiency *Patient has a history of multiple clotted dialysis access grafts and given her presentation with pulmonary embolism this is concerning for possible underlying hypercoagulable disorder therefore we will begin workup here and check serologies. Patient likely will need formal hematological evaluation after complete 6 months of anticoagulation *Lower extremity Doppler studies were completed but were inconclusive due to patient's body habitus * ECHO ordered to evaluate for PFO- pt refused.     CKD (chronic kidney disease) stage V requiring chronic dialysis - HD M/W/F  - venogram 10/24   Hyperkalemia *Improved after treatment   Systolic murmur *Patient states has had a murmur for some time *Echocardiogram was ordered but patient refused   Morbid obesity with BMI of 50.0-59.9, adult *Suspect primary contributing factor to patient's embolism   DVT prophylaxis: On full dose heparin Code Status: Full Family Communication: Spoke with patient Disposition Plan: home once INR>2  Consultants: Nephrology  Procedures: None  Antibiotics: None  HPI/Subjective: No further hemoptysis . Had venogram today    Objective: Blood pressure 135/68, pulse 87, temperature 97.1 F (36.2 C), temperature source Oral, resp. rate 20, height 5\' 3"  (1.6 m), weight 125.6 kg (276 lb 14.4 oz), last menstrual period 02/20/2012, SpO2 95.00%.  Intake/Output Summary (Last 24 hours) at 02/27/12 1825 Last data filed at 02/27/12 1324  Gross per 24 hour  Intake    480 ml  Output      0 ml  Net    480 ml     Exam: General: No acute respiratory distress, obese Lungs: Clear to auscultation bilaterally without wheezes or crackles, on room air Cardiovascular: Regular rate and rhythm without murmur gallop or rub normal S1 and S2 Abdomen: Nontender, nondistended, soft, bowel sounds positive, no rebound, no ascites, no appreciable mass   Data Reviewed: Basic Metabolic Panel:  Lab 02/27/12 6644 02/26/12 0910 02/25/12 0535 02/24/12 1155  NA 138 135 136 137  K 4.0 4.2 3.9 5.3*  CL 98 94* 96 97  CO2 23 23 24  16*  GLUCOSE 93 108* 106* 98  BUN 29* 54* 36* 95*  CREATININE 7.04* 11.38* 8.01* 14.49*  CALCIUM 8.8 9.1 8.9 8.7  MG -- -- -- --  PHOS -- 9.4* -- --   Liver Function Tests:  Lab 02/26/12 0910  AST --  ALT --  ALKPHOS --  BILITOT --  PROT --  ALBUMIN 2.9*   No results found for this basename: LIPASE:5,AMYLASE:5 in the last 168 hours No results found for this basename: AMMONIA:5 in the last 168  hours CBC:  Lab 02/27/12 0610 02/26/12 0615 02/25/12 0535 02/24/12 1839 02/24/12 1118  WBC 6.9 6.5 6.9 -- 8.8  NEUTROABS -- -- -- -- 6.8  HGB 11.5* 10.8* 11.0* 10.1* 11.6*  HCT 35.4* 32.3* 32.4* 30.4* 33.9*  MCV 83.7 82.4 81.6 -- 82.3    PLT 179 157 169 -- 202   Cardiac Enzymes: No results found for this basename: CKTOTAL:5,CKMB:5,CKMBINDEX:5,TROPONINI:5 in the last 168 hours BNP (last 3 results) No results found for this basename: PROBNP:3 in the last 8760 hours CBG: No results found for this basename: GLUCAP:5 in the last 168 hours  Recent Results (from the past 240 hour(s))  MRSA PCR SCREENING     Status: Normal   Collection Time   02/24/12  6:38 PM      Component Value Range Status Comment   MRSA by PCR NEGATIVE  NEGATIVE Final      Studies:  Recent x-ray studies have been reviewed in detail by the Attending Physician  Scheduled Meds:     . amLODipine  10 mg Oral QHS  . cinacalcet  90 mg Oral QODAY  . coumadin book   Does not apply Once  . darbepoetin  60 mcg Intravenous Q7 days  . heparin  20 Units/kg Dialysis Once in dialysis  . influenza  inactive virus vaccine  0.25 mL Intramuscular Tomorrow-1000  . influenza  inactive virus vaccine  0.5 mL Intramuscular Tomorrow-1000  . lanthanum  2,000 mg Oral TID WC  . lisinopril  20 mg Oral q morning - 10a  . lisinopril  40 mg Oral QHS  . multivitamin  1 tablet Oral QHS  . paricalcitol      . paricalcitol  2 mcg Intravenous 3 times weekly  . sodium chloride  3 mL Intravenous Q12H  . warfarin  7.5 mg Oral ONCE-1800  . Warfarin - Pharmacist Dosing Inpatient   Does not apply q1800      [facility]  [facility] Office  [phone] Pager [phone]  On-Call/Text Page:      [email or web address]      password [email or web address]  If 7PM-7AM, please contact night-coverage [email or web address] Password [email or web address] 02/27/2012, 6:25 PM   LOS: 3 days

## 2012-02-27 NOTE — Progress Notes (Signed)
Subjective:  No complaints. Objective Vital signs in last 24 hours: Filed Vitals:   02/26/12 2158 02/27/12 0603 02/27/12 0750 02/27/12 1324  BP: 122/71 117/80 123/76 135/68  Pulse: 87 88 89 87  Temp: 98.4 F (36.9 C) 98 F (36.7 C) 98.2 F (36.8 C) 97.1 F (36.2 C)  TempSrc: Oral Oral Oral Oral  Resp: 16 20 20 20   Height:      Weight: 125.6 kg (276 lb 14.4 oz)     SpO2: 96% 93% 94% 95%   Weight change: -6.9 kg (-15 lb 3.4 oz)  Intake/Output Summary (Last 24 hours) at 02/27/12 1341 Last data filed at 02/27/12 1324  Gross per 24 hour  Intake   1200 ml  Output   2700 ml  Net  -1500 ml   Labs: Basic Metabolic Panel:  Lab 02/27/12 1610 02/26/12 0910 02/25/12 0535  NA 138 135 136  K 4.0 4.2 3.9  CL 98 94* 96  CO2 23 23 24   GLUCOSE 93 108* 106*  BUN 29* 54* 36*  CREATININE 7.04* 11.38* 8.01*  CALCIUM 8.8 9.1 8.9  ALB -- -- --  PHOS -- 9.4* --   Liver Function Tests:  Lab 02/26/12 0910  AST --  ALT --  ALKPHOS --  BILITOT --  PROT --  ALBUMIN 2.9*   No results found for this basename: LIPASE:3,AMYLASE:3 in the last 168 hours No results found for this basename: AMMONIA:3 in the last 168 hours CBC:  Lab 02/27/12 0610 02/26/12 0615 02/25/12 0535 02/24/12 1118  WBC 6.9 6.5 6.9 --  NEUTROABS -- -- -- 6.8  HGB 11.5* 10.8* 11.0* --  HCT 35.4* 32.3* 32.4* --  MCV 83.7 82.4 81.6 82.3  PLT 179 157 169 --   Cardiac Enzymes: No results found for this basename: CKTOTAL:5,CKMB:5,CKMBINDEX:5,TROPONINI:5 in the last 168 hours CBG: No results found for this basename: GLUCAP:5 in the last 168 hours  Iron Studies: No results found for this basename: IRON,TIBC,TRANSFERRIN,FERRITIN in the last 72 hours Studies/Results: No results found. Medications:    . heparin 1,650 Units/hr (02/26/12 0915)      . amLODipine  10 mg Oral QHS  . cinacalcet  90 mg Oral QODAY  . coumadin book   Does not apply Once  . darbepoetin  60 mcg Intravenous Q7 days  . heparin  20 Units/kg  Dialysis Once in dialysis  . influenza  inactive virus vaccine  0.25 mL Intramuscular Tomorrow-1000  . influenza  inactive virus vaccine  0.5 mL Intramuscular Tomorrow-1000  . lanthanum  2,000 mg Oral TID WC  . lisinopril  20 mg Oral q morning - 10a  . lisinopril  40 mg Oral QHS  . multivitamin  1 tablet Oral QHS  . paricalcitol      . paricalcitol  2 mcg Intravenous 3 times weekly  . sodium chloride  3 mL Intravenous Q12H  . warfarin  7.5 mg Oral ONCE-1800  . Warfarin - Pharmacist Dosing Inpatient   Does not apply q1800  . DISCONTD: warfarin   Does not apply Once  . DISCONTD: Warfarin - Pharmacist Dosing Inpatient   Does not apply q1800   I  have reviewed scheduled and prn medications.  Physical Exam: General: Alert, nad, aapropriate Heart: RRR, no rub Lungs: Decr. At bases otherwise cta Abdomen: obese, soft non tedner Extremities: Dialysis Access: no pedal edema,R IJ Perm cath  OUTPATIENT HD: MWF, 4.5 hr, [facility], 126.5kg EDW. Zemplar 2ug, EPO 7800u, IV iron q Wed. Heparin 13000u then 2000  mid Rx. 2.5Ca/2.0K bath. L IJ TDC.   Problem/Plan: 1.  Pulmonary embolism w hemoptysis- no further hemoptysis , on IV hep.Marland KitchenPossible Hypercoagulable state with evaluation per primary . AS noted Radiology does not think Perm cath is  Etiology of PE. Noted Dr. Lavera Guise will monitor INR after dc at kidney center Unable to do LE venous dopplers due to technical factors.  2. ESRD, hd mwf. - HD yesterday  uf with bp stable/ pre hd standing weight in HD 127.7;post wt yesterday of 134.5 is a bed scale weight and most likely an error, she tells me she can stand for post hd wts. 3. HD Access=Dr. Myra Gianotti today Bilateral upper extremity Venogram= "no evidence of central venous stenosis needs permanent access  After hypercoagulable state work up finished 4. HTN.vol- 135/68 BP  / On= lisinopril 40pm/20am, amlodipine 10/hs /4 kg off w hd Monday. Pre HD BPs  are MUCH better than outpt setting reflective of better  volume control.  Lowering EDW.   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 Last PTH 136. /  zemplar when Ca and Phos product lower  6. Anemia of CKD- hgb 11.5 and cont Aranesp 60, last tsat 41% with ferritin in 1600s - no IV Fe . Fu am hgb 7. Morbid Obesity= Use High Protein Renal Diet [name], PA-C [facility]per [phone] 02/27/2012,1:41 PM  LOS: 3 days   Patient seen and examined and agree with assessment and plan as above.  Vinson Moselle  MD [employer] [phone] pgr    [phone] cell 02/27/2012, 2:26 PM

## 2012-02-27 NOTE — Telephone Encounter (Addendum)
Message copied by Shari Prows on Thu Feb 27, 2012  4:04 PM ------      Message from: Phillips Odor      Created: Thu Feb 27, 2012  2:12 PM                   ----- Message -----         From: Nada Libman, MD         Sent: 02/27/2012  12:43 PM           To: Reuel Derby, Melene Plan, RN            02/27/2012                   1.  Bilateral upper extremity venogram            Have her follow up with me in 3 months for a discussion of dialysis  access planning  I scheduled an appt for the above pt for 05/25/12 at 1:15pm. Pt is aware of the appt. i mailed a letter as well/awt

## 2012-02-27 NOTE — Progress Notes (Signed)
ANTICOAGULATION CONSULT NOTE - Follow Up Consult  Pharmacy Consult for Heparin and Coumadin Indication: pulmonary embolus  Allergies  Allergen Reactions  . Zolpidem Tartrate Other (See Comments)    hallucinations    Patient Measurements: Height: 5\' 3"  (160 cm) Weight: 276 lb 14.4 oz (125.6 kg) IBW/kg (Calculated) : 52.4  Heparin Dosing Weight: 86.2kg  Vital Signs: Temp: 98 F (36.7 C) (10/24 0603) Temp src: Oral (10/24 0603) BP: 117/80 mmHg (10/24 0603) Pulse Rate: 88  (10/24 0603)  Labs:  Kristine Rios 02/27/12 0610 02/26/12 1749 02/26/12 0910 02/26/12 0615 02/25/12 0535 02/24/12 1155  HGB 11.5* -- -- 10.8* -- --  HCT 35.4* -- -- 32.3* 32.4* --  PLT 179 -- -- 157 169 --  APTT -- -- -- -- -- --  LABPROT 13.4 -- -- -- -- 13.4  INR 1.03 -- -- -- -- 1.03  HEPARINUNFRC 0.38 0.43 1.51* -- -- --  CREATININE 7.04* -- 11.38* -- 8.01* --  CKTOTAL -- -- -- -- -- --  CKMB -- -- -- -- -- --  TROPONINI -- -- -- -- -- --    Estimated Creatinine Clearance: 14.4 ml/min (by C-G formula based on Cr of 7.04).   Medications:  Heparin 1650 units/hr  Assessment: 36yof on heparin for CTA suspected PE. Heparin level (0.38) is now therapeutic x 2 - will continue with current rate. Coumadin was originally to start 10/23 but was held for patient to have venograms today (10/24). Per discussion with Dr. Lavera Guise, proceed with Coumadin initiation tonight. - H/H and Plts improving - No significant bleeding reported  Goal of Therapy:  INR 2-3 Heparin level 0.3-0.7 units/ml Monitor platelets by anticoagulation protocol: Yes   Plan:  1. Continue heparin drip 1650 units/hr (16.5 ml/hr) 2. Coumadin 7.5mg  po x 1 today 3. Follow-up AM heparin level, INR and CBC  Cleon Dew [phone] 02/27/2012,9:50 AM

## 2012-02-28 ENCOUNTER — Inpatient Hospital Stay (HOSPITAL_COMMUNITY): Payer: Medicare Other

## 2012-02-28 LAB — RENAL FUNCTION PANEL
BUN: 50 mg/dL — ABNORMAL HIGH (ref 6–23)
CO2: 23 mEq/L (ref 19–32)
Calcium: 9.1 mg/dL (ref 8.4–10.5)
Creatinine, Ser: 10.57 mg/dL — ABNORMAL HIGH (ref 0.50–1.10)
GFR calc Af Amer: 5 mL/min — ABNORMAL LOW (ref >90)
Glucose, Bld: 105 mg/dL — ABNORMAL HIGH (ref 70–99)
Sodium: 138 mEq/L (ref 135–145)

## 2012-02-28 LAB — PROTIME-INR
INR: 1.44 (ref 0.00–1.49)
INR: 1.04 (ref 0.00–1.49)
Prothrombin Time: 17.2 seconds — ABNORMAL HIGH (ref 11.6–15.2)
Prothrombin Time: 13.5 seconds (ref 11.6–15.2)

## 2012-02-28 LAB — CBC
MCH: 27.4 pg (ref 26.0–34.0)
MCHC: 32.7 g/dL (ref 30.0–36.0)
Platelets: 148 10*3/uL — ABNORMAL LOW (ref 150–400)
RDW: 15.1 % (ref 11.5–15.5)

## 2012-02-28 LAB — HEPARIN LEVEL (UNFRACTIONATED): Heparin Unfractionated: 0.52 IU/mL (ref 0.30–0.70)

## 2012-02-28 MED ORDER — PARICALCITOL 5 MCG/ML IV SOLN
INTRAVENOUS | Status: AC
  Administered 2012-02-28: 2 ug via INTRAVENOUS
  Filled 2012-02-28: qty 1

## 2012-02-28 MED ORDER — WARFARIN SODIUM 7.5 MG PO TABS
7.5000 mg | ORAL_TABLET | Freq: Once | ORAL | Status: AC
  Administered 2012-02-28: 7.5 mg via ORAL
  Filled 2012-02-28: qty 1

## 2012-02-28 NOTE — Progress Notes (Signed)
Spoke with pt this am about access surgery for next week.  Pt states she is not here for this and does not want the surgery.  She has a follow up appt with Dr. Myra Gianotti in January.  Will sign off.  Doreatha Massed 02/28/2012 8:38 AM

## 2012-02-28 NOTE — Progress Notes (Signed)
Subjective:  On hd no cos/ pt. Tells me "Dr. Myra Gianotti plans new Avvg in Jan. Because of lung blood clots" Objective Vital signs in last 24 hours: Filed Vitals:   02/28/12 0508 02/28/12 0909 02/28/12 0921 02/28/12 0943  BP: 122/75 138/105 130/79 125/77  Pulse: 87 104 82 89  Temp: 98.2 F (36.8 C) 97.8 F (36.6 C)    TempSrc: Oral Oral    Resp: 18 20    Height:      Weight:  130 kg (286 lb 9.6 oz)    SpO2: 97% 97%     Weight change: 0.5 kg (1 lb 1.6 oz)  Intake/Output Summary (Last 24 hours) at 02/28/12 1005 Last data filed at 02/28/12 0921  Gross per 24 hour  Intake 1147.4 ml  Output      0 ml  Net 1147.4 ml   Labs: Basic Metabolic Panel:  Lab 02/27/12 1478 02/26/12 0910 02/25/12 0535  NA 138 135 136  K 4.0 4.2 3.9  CL 98 94* 96  CO2 23 23 24   GLUCOSE 93 108* 106*  BUN 29* 54* 36*  CREATININE 7.04* 11.38* 8.01*  CALCIUM 8.8 9.1 8.9  ALB -- -- --  PHOS -- 9.4* --   Liver Function Tests:  Lab 02/26/12 0910  AST --  ALT --  ALKPHOS --  BILITOT --  PROT --  ALBUMIN 2.9*   No results found for this basename: LIPASE:3,AMYLASE:3 in the last 168 hours No results found for this basename: AMMONIA:3 in the last 168 hours CBC:  Lab 02/28/12 0800 02/27/12 0610 02/26/12 0615 02/25/12 0535 02/24/12 1118  WBC 5.6 6.9 6.5 -- --  NEUTROABS -- -- -- -- 6.8  HGB 9.9* 11.5* 10.8* -- --  HCT 30.3* 35.4* 32.3* -- --  MCV 83.9 83.7 82.4 81.6 82.3  PLT 148* 179 157 -- --   Cardiac Enzymes: No results found for this basename: CKTOTAL:5,CKMB:5,CKMBINDEX:5,TROPONINI:5 in the last 168 hours CBG: No results found for this basename: GLUCAP:5 in the last 168 hours  Iron Studies: No results found for this basename: IRON,TIBC,TRANSFERRIN,FERRITIN in the last 72 hours Studies/Results: No results found. Medications:    . heparin 1,650 Units/hr (02/28/12 0733)      . amLODipine  10 mg Oral QHS  . cinacalcet  90 mg Oral QODAY  . darbepoetin  60 mcg Intravenous Q7 days  .  heparin  20 Units/kg Dialysis Once in dialysis  . influenza  inactive virus vaccine  0.5 mL Intramuscular Tomorrow-1000  . lanthanum  2,000 mg Oral TID WC  . lisinopril  20 mg Oral q morning - 10a  . lisinopril  40 mg Oral QHS  . multivitamin  1 tablet Oral QHS  . paricalcitol  2 mcg Intravenous 3 times weekly  . sodium chloride  3 mL Intravenous Q12H  . warfarin  7.5 mg Oral ONCE-1800  . Warfarin - Pharmacist Dosing Inpatient   Does not apply q1800   I  have reviewed scheduled and prn medications.    Physical Exam:  General: Alert, nad on hd  Heart: RRR, no rub  Lungs: Decr. At bases otherwise cta  Abdomen: obese, soft non tender  Extremities: Dialysis Access: no pedal edema,R IJ Perm cath patent on hd  OUTPATIENT HD: MWF, 4.5 hr, [facility], 126.5kg EDW. Zemplar 2ug, EPO 7800u, IV iron q Wed. Heparin 13000u then 2000 mid Rx. 2.5Ca/2.0K bath. L IJ TDC.    Problem/Plan: 1. Pulmonary embolism w hemoptysis- , on IV hep.and Coumadin  Bridge  with Pharmacy now INR =1.44 /.Possible Hypercoagulable state  Formal evaluation needed as noted  after 6 months anticoagulation. Unable to do LE venous dopplers due to technical factors.   OUT PT INRs  Will be Monitored at the outpt  kidney center by [facility] 2.  ESRD, hd mwf. - HD today uf goal = 3to 3.5/ today pre hd standing weight in HD 130//;post wt wed of 134.5 is a bed scale weight and most likely an error, she tells me she can stand for post hd wts. 3. HD Access=Dr. Brabham's Bilateral upper extremity Venogram= no evidence of central venous stenosis. Needs permanent access when stable from PE. 4. HTN.vol- 122/75 BP / On= lisinopril 40pm/20am, amlodipine 10/hs / Pre HD BPs are MUCH better than outpt setting reflective of better volume control. And ? compliance with meds/ may need to taper bp meds down in hospital setting to uf  Fluid.  Last cxr 02/24/12 " mild edema" at wt 132kg. 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 / IN hosp use fosrenol 2 gm ac and, sensipar 90/d. Last PTH 136.  No zemplar 6. Anemia of CKD- hgb 11.5 > 9.9 this am/ cont Aranesp 60, last tsat 41% with ferritin in 1600s - no IV Fe . Fu am hgb 7. Morbid Obesity= Use High Protein Renal Diet  [name], PA-C [facility] Beeper [phone] 02/28/2012,10:05 AM  LOS: 4 days   Patient seen and examined and agree with assessment and plan as above.  Vinson Moselle  MD [facility] [phone] pgr    [phone] cell 02/28/2012, 11:07 AM

## 2012-02-28 NOTE — Progress Notes (Signed)
   ANTICOAGULATION CONSULT NOTE - Follow Up Consult  Pharmacy Consult for Heparin and Coumadin Indication: Suspected PE  Allergies  Allergen Reactions  . Zolpidem Tartrate Other (See Comments)    hallucinations    Patient Measurements: Height: 5\' 3"  (160 cm) Weight: 286 lb 9.6 oz (130 kg) IBW/kg (Calculated) : 52.4  Heparin Dosing Weight: 86.2kg  Vital Signs: Temp: 97.8 F (36.6 C) (10/25 0909) Temp src: Oral (10/25 0909) BP: 148/123 mmHg (10/25 1100) Pulse Rate: 50  (10/25 1100)  Labs:  Basename 02/28/12 1039 02/28/12 0930 02/28/12 0800 02/27/12 0610 02/26/12 1749 02/26/12 0910 02/26/12 0615  HGB -- -- 9.9* 11.5* -- -- --  HCT -- -- 30.3* 35.4* -- -- 32.3*  PLT -- -- 148* 179 -- -- 157  APTT -- -- -- -- -- -- --  LABPROT 13.5 -- 17.2* 13.4 -- -- --  INR 1.04 -- 1.44 1.03 -- -- --  HEPARINUNFRC 0.52 -- -- 0.38 0.43 -- --  CREATININE -- 10.57* -- 7.04* -- 11.38* --  CKTOTAL -- -- -- -- -- -- --  CKMB -- -- -- -- -- -- --  TROPONINI -- -- -- -- -- -- --    Estimated Creatinine Clearance: 9.8 ml/min (by C-G formula based on Cr of 10.57).   Medications:  Heparin drip 1650 units/hr  Assessment: 36yof on Heparin bridging to Coumadin (Day 2 of minimum 5 Day overlap) for CTA suspected PE. Recheck heparin level (original level reported >2 was drawn inappropriately) was therapeutic at 0.52. INR (1.04) is subtherapeutic as expected after Coumadin 7.5mg  x 1 - will repeat dose.  - H/H and Plts decreased - No significant bleeding reported  Goal of Therapy:  INR 2-3 Heparin level 0.3-0.7 units/ml Monitor platelets by anticoagulation protocol: Yes   Plan:  1. Continue Heparin drip 1650 units/hr (16.5 ml/hr) 2. Repeat Coumadin 7.5mg  po x 1 today 3. Daily INR, heparin level, CBC 4. Monitor for s/sx of bleeding  Cleon Dew [phone] 02/28/2012,11:25 AM

## 2012-02-28 NOTE — Progress Notes (Signed)
[facility] Progress Note    Kristine Rios:[id number] DOB: 10-02-1975 DOA: 02/24/2012 PCP: Kathreen Cosier, MD  Brief narrative: 36 year old female patient on chronic dialysis who presented to the emergency department after developing hemoptysis. She endorsed nonproductive cough for 2 days prior to this. No other significant symptoms such a shortness of breath, leg swelling, chest pain. She also was not taking any anticoagulant medications or utilizing any antiplatelet drugs. In the emergency department chest x-ray demonstrated mild edema. CT angiography of the chest was completed and this demonstrated high index of suspicion for left lower lobe pulmonary embolus. After she presented to the emergency department her hemoptysis spontaneously resolved but after the heparin drip was initiated in the emergency department her hemoptysis recurred. She was otherwise hemodynamically stable and her hemoglobin was stable. She was subsequently admitted to the step down unit for further monitoring and treatment  Assessment/Plan:   *Acute pulmonary embolism *Likely related to patient's obesity and suspected underlying venous insufficiency *Patient has a history of multiple clotted dialysis access grafts and given her presentation with pulmonary embolism this is concerning for possible underlying hypercoagulable disorder. Lupus anticoagulant is negative, beta 2 glycoprotein negative, cardiolipin Ig  A weekly positive, factor V leiden negative, homocysteine elevated.   Patient likely will need formal hematological evaluation after complete 6 months of anticoagulation *Lower extremity Doppler studies were completed but were inconclusive due to patient's body habitus * ECHO ordered to evaluate for PFO- pt refused.  - on Heparin iv since admission  Coumadin started on 10/24 -     CKD (chronic kidney disease) stage V requiring chronic dialysis - HD M/W/F  - venogram 10/24 - no central occlusion     Hyperkalemia *Improved after treatment   Systolic murmur *Patient states has had a murmur for some time *Echocardiogram was ordered but patient refused   Morbid obesity with BMI of 50.0-59.9, adult *Suspect primary contributing factor to patient's embolism   DVT prophylaxis: On full dose heparin Code Status: Full Family Communication: Spoke with patient Disposition Plan: home once INR>2  Consultants: Nephrology  Procedures: Venogram   Antibiotics: None  HPI/Subjective: No further hemoptysis . Had HD today    Objective: Blood pressure 137/89, pulse 83, temperature 98.6 F (37 C), temperature source Oral, resp. rate 20, height 5\' 3"  (1.6 m), weight 124 kg (273 lb 5.9 oz), last menstrual period 02/20/2012, SpO2 99.00%.  Intake/Output Summary (Last 24 hours) at 02/28/12 1702 Last data filed at 02/28/12 1300  Gross per 24 hour  Intake 1147.4 ml  Output      0 ml  Net 1147.4 ml     Exam: General: No acute respiratory distress, obese Lungs: Clear to auscultation bilaterally without wheezes or crackles, on room air Cardiovascular: Regular rate and rhythm without murmur gallop or rub normal S1 and S2 Abdomen: Nontender, nondistended, soft, bowel sounds positive, no rebound, no ascites, no appreciable mass   Data Reviewed: Basic Metabolic Panel:  Lab 02/28/12 9562 02/27/12 0610 02/26/12 0910 02/25/12 0535 02/24/12 1155  NA 138 138 135 136 137  K 4.0 4.0 4.2 3.9 5.3*  CL 99 98 94* 96 97  CO2 23 23 23 24  16*  GLUCOSE 105* 93 108* 106* 98  BUN 50* 29* 54* 36* 95*  CREATININE 10.57* 7.04* 11.38* 8.01* 14.49*  CALCIUM 9.1 8.8 9.1 8.9 8.7  MG -- -- -- -- --  PHOS 6.9* -- 9.4* -- --   Liver Function Tests:  Lab 02/28/12 0930 02/26/12 0910  AST -- --  ALT -- --  ALKPHOS -- --  BILITOT -- --  PROT -- --  ALBUMIN 2.9* 2.9*   No results found for this basename: LIPASE:5,AMYLASE:5 in the last 168 hours No results found for this basename: AMMONIA:5 in the last  168 hours CBC:  Lab 02/28/12 0800 02/27/12 0610 02/26/12 0615 02/25/12 0535 02/24/12 1839 02/24/12 1118  WBC 5.6 6.9 6.5 6.9 -- 8.8  NEUTROABS -- -- -- -- -- 6.8  HGB 9.9* 11.5* 10.8* 11.0* 10.1* --  HCT 30.3* 35.4* 32.3* 32.4* 30.4* --  MCV 83.9 83.7 82.4 81.6 -- 82.3  PLT 148* 179 157 169 -- 202   Cardiac Enzymes: No results found for this basename: CKTOTAL:5,CKMB:5,CKMBINDEX:5,TROPONINI:5 in the last 168 hours BNP (last 3 results) No results found for this basename: PROBNP:3 in the last 8760 hours CBG: No results found for this basename: GLUCAP:5 in the last 168 hours  Recent Results (from the past 240 hour(s))  MRSA PCR SCREENING     Status: Normal   Collection Time   02/24/12  6:38 PM      Component Value Range Status Comment   MRSA by PCR NEGATIVE  NEGATIVE Final      Studies:  Recent x-ray studies have been reviewed in detail by the Attending Physician  Scheduled Meds:     . amLODipine  10 mg Oral QHS  . cinacalcet  90 mg Oral QODAY  . darbepoetin  60 mcg Intravenous Q7 days  . heparin  20 Units/kg Dialysis Once in dialysis  . influenza  inactive virus vaccine  0.5 mL Intramuscular Tomorrow-1000  . lanthanum  2,000 mg Oral TID WC  . lisinopril  20 mg Oral q morning - 10a  . lisinopril  40 mg Oral QHS  . multivitamin  1 tablet Oral QHS  . paricalcitol  2 mcg Intravenous 3 times weekly  . sodium chloride  3 mL Intravenous Q12H  . warfarin  7.5 mg Oral ONCE-1800  . warfarin  7.5 mg Oral ONCE-1800  . Warfarin - Pharmacist Dosing Inpatient   Does not apply q1800      [facility]  [facility] Office  [phone] Pager [phone]  On-Call/Text Page:      [email or web address]      password [email or web address]  If 7PM-7AM, please contact night-coverage [email or web address] Password [email or web address] 02/28/2012, 5:02 PM   LOS: 4 days

## 2012-02-28 NOTE — Procedures (Signed)
I was present at this dialysis session. I have reviewed the session itself and made appropriate changes.   Vinson Moselle, MD [employer] 02/28/2012, 11:07 AM

## 2012-02-29 LAB — PROTIME-INR
INR: 1.03 (ref 0.00–1.49)
Prothrombin Time: 13.4 seconds (ref 11.6–15.2)

## 2012-02-29 LAB — CBC
HCT: 30.1 % — ABNORMAL LOW (ref 36.0–46.0)
RBC: 3.54 MIL/uL — ABNORMAL LOW (ref 3.87–5.11)
RDW: 15.2 % (ref 11.5–15.5)
WBC: 6.2 10*3/uL (ref 4.0–10.5)

## 2012-02-29 MED ORDER — WARFARIN SODIUM 10 MG PO TABS
10.0000 mg | ORAL_TABLET | Freq: Once | ORAL | Status: AC
  Administered 2012-02-29: 10 mg via ORAL
  Filled 2012-02-29: qty 1

## 2012-02-29 NOTE — Progress Notes (Signed)
[facility] Progress Note    Kristine Rios ZOX:[id number] DOB: 10-Dec-1975 DOA: 02/24/2012 PCP: Kathreen Cosier, MD  Brief narrative: 36 year old female patient on chronic dialysis who presented to the emergency department after developing hemoptysis. She endorsed nonproductive cough for 2 days prior to this. No other significant symptoms such a shortness of breath, leg swelling, chest pain. She also was not taking any anticoagulant medications or utilizing any antiplatelet drugs. In the emergency department chest x-ray demonstrated mild edema. CT angiography of the chest was completed and this demonstrated high index of suspicion for left lower lobe pulmonary embolus. After she presented to the emergency department her hemoptysis spontaneously resolved but after the heparin drip was initiated in the emergency department her hemoptysis recurred. She was otherwise hemodynamically stable and her hemoglobin was stable. She was subsequently admitted to the step down unit for further monitoring and treatment  Assessment/Plan:   *Acute pulmonary embolism *Likely related to patient's obesity and suspected underlying venous insufficiency *Patient has a history of multiple clotted dialysis access grafts and given her presentation with pulmonary embolism this is concerning for possible underlying hypercoagulable disorder. Lupus anticoagulant is negative, beta 2 glycoprotein negative, cardiolipin Ig  A weekly positive, factor V leiden negative, homocysteine elevated.   Patient likely will need formal hematological evaluation after complete 6 months of anticoagulation *Lower extremity Doppler studies were completed but were inconclusive due to patient's body habitus * ECHO ordered to evaluate for PFO- pt refused.  - on Heparin iv since admission  Coumadin started on 10/24 -     CKD (chronic kidney disease) stage V requiring chronic dialysis - HD M/W/F  - venogram 10/24 - no central occlusion     Hyperkalemia *Improved after treatment   Systolic murmur *Patient states has had a murmur for some time *Echocardiogram was ordered but patient refused   Morbid obesity with BMI of 50.0-59.9, adult *Suspect primary contributing factor to patient's embolism   DVT prophylaxis: On full dose heparin Code Status: Full Family Communication: Spoke with patient Disposition Plan: home once [employer]  Consultants: Nephrology  Procedures: Venogram   Antibiotics: None  HPI/Subjective: Coughed up one time dark sputum/ no chest pain no dyspnea     Objective: Blood pressure 102/55, pulse 94, temperature 98.4 F (36.9 C), temperature source Oral, resp. rate 20, height 5\' 3"  (1.6 m), weight 126.8 kg (279 lb 8.7 oz), last menstrual period 02/20/2012, SpO2 93.00%.  Intake/Output Summary (Last 24 hours) at 02/29/12 0851 Last data filed at 02/29/12 8119  Gross per 24 hour  Intake 1410.68 ml  Output   3900 ml  Net -2489.32 ml     Exam: General: No acute respiratory distress, obese Lungs: Clear to auscultation bilaterally without wheezes or crackles, on room air Cardiovascular: Regular rate and rhythm without murmur gallop or rub normal S1 and S2 Abdomen: Nontender, nondistended, soft, bowel sounds positive, no rebound, no ascites, no appreciable mass   Data Reviewed: Basic Metabolic Panel:  Lab 02/28/12 1478 02/27/12 0610 02/26/12 0910 02/25/12 0535 02/24/12 1155  NA 138 138 135 136 137  K 4.0 4.0 4.2 3.9 5.3*  CL 99 98 94* 96 97  CO2 23 23 23 24  16*  GLUCOSE 105* 93 108* 106* 98  BUN 50* 29* 54* 36* 95*  CREATININE 10.57* 7.04* 11.38* 8.01* 14.49*  CALCIUM 9.1 8.8 9.1 8.9 8.7  MG -- -- -- -- --  PHOS 6.9* -- 9.4* -- --   Liver Function Tests:  Lab 02/28/12 0930 02/26/12 0910  AST -- --  ALT -- --  ALKPHOS -- --  BILITOT -- --  PROT -- --  ALBUMIN 2.9* 2.9*   No results found for this basename: LIPASE:5,AMYLASE:5 in the last 168 hours No results found for this  basename: AMMONIA:5 in the last 168 hours CBC:  Lab 02/29/12 0600 02/28/12 0800 02/27/12 0610 02/26/12 0615 02/25/12 0535 02/24/12 1118  WBC 6.2 5.6 6.9 6.5 6.9 --  NEUTROABS -- -- -- -- -- 6.8  HGB 10.1* 9.9* 11.5* 10.8* 11.0* --  HCT 30.1* 30.3* 35.4* 32.3* 32.4* --  MCV 85.0 83.9 83.7 82.4 81.6 --  PLT 153 148* 179 157 169 --   Results for Kristine Rios (MRN [id number]) as of 02/29/2012 08:51  Ref. Range 02/28/2012 10:39 02/29/2012 06:00  Prothrombin Time Latest Range: 11.6-15.2 seconds 13.5 13.4  INR Latest Range: 0.00-1.49  1.04 1.03    Recent Results (from the past 240 hour(s))  MRSA PCR SCREENING     Status: Normal   Collection Time   02/24/12  6:38 PM      Component Value Range Status Comment   MRSA by PCR NEGATIVE  NEGATIVE Final      Studies:  Recent x-ray studies have been reviewed in detail by the Attending Physician  Scheduled Meds:     . amLODipine  10 mg Oral QHS  . cinacalcet  90 mg Oral QODAY  . darbepoetin  60 mcg Intravenous Q7 days  . heparin  20 Units/kg Dialysis Once in dialysis  . influenza  inactive virus vaccine  0.5 mL Intramuscular Tomorrow-1000  . lanthanum  2,000 mg Oral TID WC  . lisinopril  20 mg Oral q morning - 10a  . lisinopril  40 mg Oral QHS  . multivitamin  1 tablet Oral QHS  . paricalcitol  2 mcg Intravenous 3 times weekly  . sodium chloride  3 mL Intravenous Q12H  . warfarin  7.5 mg Oral ONCE-1800  . Warfarin - Pharmacist Dosing Inpatient   Does not apply q1800      [facility]  [facility] Office  [phone] Pager [phone]  On-Call/Text Page:      [email or web address]      password [email or web address]  If 7PM-7AM, please contact night-coverage [email or web address] Password [email or web address] 02/29/2012, 8:51 AM   LOS: 5 days

## 2012-02-29 NOTE — Progress Notes (Signed)
Subjective:  No cos, tolerated hd without problems Objective Vital signs in last 24 hours: Filed Vitals:   02/28/12 1504 02/28/12 2054 02/29/12 0525 02/29/12 0937  BP: 137/89 129/77 102/55 111/77  Pulse:  93 94 90  Temp:  97.5 F (36.4 C) 98.4 F (36.9 C) 98.2 F (36.8 C)  TempSrc:  Oral Oral Oral  Resp:  20 20 20   Height:  5\' 3"  (1.6 m)    Weight:  126.8 kg (279 lb 8.7 oz)    SpO2:  97% 93% 94%   Weight change: 1.8 kg (3 lb 15.5 oz)  Intake/Output Summary (Last 24 hours) at 02/29/12 0955 Last data filed at 02/29/12 0900  Gross per 24 hour  Intake 1530.68 ml  Output   3900 ml  Net -2369.32 ml   Labs: Basic Metabolic Panel:  Lab 02/28/12 1610 02/27/12 0610 02/26/12 0910  NA 138 138 135  K 4.0 4.0 4.2  CL 99 98 94*  CO2 23 23 23   GLUCOSE 105* 93 108*  BUN 50* 29* 54*  CREATININE 10.57* 7.04* 11.38*  CALCIUM 9.1 8.8 9.1  ALB -- -- --  PHOS 6.9* -- 9.4*   Liver Function Tests:  Lab 02/28/12 0930 02/26/12 0910  AST -- --  ALT -- --  ALKPHOS -- --  BILITOT -- --  PROT -- --  ALBUMIN 2.9* 2.9*   No results found for this basename: LIPASE:3,AMYLASE:3 in the last 168 hours No results found for this basename: AMMONIA:3 in the last 168 hours CBC:  Lab 02/29/12 0600 02/28/12 0800 02/27/12 0610 02/26/12 0615 02/25/12 0535 02/24/12 1118  WBC 6.2 5.6 6.9 -- -- --  NEUTROABS -- -- -- -- -- 6.8  HGB 10.1* 9.9* 11.5* -- -- --  HCT 30.1* 30.3* 35.4* -- -- --  MCV 85.0 83.9 83.7 82.4 81.6 --  PLT 153 148* 179 -- -- --   Cardiac Enzymes: No results found for this basename: CKTOTAL:5,CKMB:5,CKMBINDEX:5,TROPONINI:5 in the last 168 hours CBG: No results found for this basename: GLUCAP:5 in the last 168 hours  Iron Studies: No results found for this basename: IRON,TIBC,TRANSFERRIN,FERRITIN in the last 72 hours Studies/Results: No results found. Medications:    . heparin 1,650 Units/hr (02/29/12 0022)      . amLODipine  10 mg Oral QHS  . cinacalcet  90 mg Oral  QODAY  . darbepoetin  60 mcg Intravenous Q7 days  . heparin  20 Units/kg Dialysis Once in dialysis  . influenza  inactive virus vaccine  0.5 mL Intramuscular Tomorrow-1000  . lanthanum  2,000 mg Oral TID WC  . lisinopril  20 mg Oral q morning - 10a  . lisinopril  40 mg Oral QHS  . multivitamin  1 tablet Oral QHS  . paricalcitol  2 mcg Intravenous 3 times weekly  . sodium chloride  3 mL Intravenous Q12H  . warfarin  7.5 mg Oral ONCE-1800  . Warfarin - Pharmacist Dosing Inpatient   Does not apply q1800   I  have reviewed scheduled and prn medications.  Physical Exam:  General: Alert, nad on hd  Heart: RRR, no rub  Lungs: Decr. At bases otherwise cta  Abdomen: obese, soft non tender  Extremities: Dialysis Access: no pedal edema,R IJ Perm cath    OUTPATIENT HD: MWF, 4.5 hr, [facility], 126.5kg EDW. Zemplar 2ug, EPO 7800u, IV iron q Wed. Heparin 13000u then 2000 mid Rx. 2.5Ca/2.0K bath. L IJ TDC.   Problem/Plan:  1. Pulmonary embolism w hemoptysis- , on IV hep.and  Coumadin Bridge with Pharmacy now INR =1.03 /.Possible Hypercoagulable state Formal evaluation needed as noted after 6 months anticoagulation. Unable to do LE venous dopplers due to technical factors. OUT PT INRs Will be Monitored at the outpt kidney center by [facility] 2. ESRD, hd mwf. - HD yesterday 4.7 luf with post wt. =124 uf goal and tolerated . 3. HD Access=Dr. Brabham's Bilateral upper extremity Venogram= no evidence of central venous stenosis. Needs permanent access when stable from PE. 4. HTN.vol- 111/77 BP / On= lisinopril 40pm/20am, amlodipine 10/hs /  Hosp. BPs are MUCH better than outpt setting= reflective of better volume control. And ? compliance with meds/ may need to taper bp meds down in hospital setting to uf Fluid. Last cxr 02/24/12 " mild edema" at wt 132kg. 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 / IN hosp ON fosrenol 2 gm ac  and, sensipar 90/d. Last PTH 136. No zemplar 6. Anemia of CKD- hgb 11.5 > 9.9> 10.1 this am/ cont Aranesp 60, last tsat 41% with ferritin in 1600s - no IV Fe . 7. Morbid Obesity= Use High Protein Renal Diet 8. DM type 2 = CBG 105 Kristine Pastel, PA-C [facility] [phone] 02/29/2012,9:55 AM  LOS: 5 days   Patient seen and examined and agree with assessment and plan as above. Stable on IV heparin for PE, coumadin started 2 nights ago, awaiting therapeutic INR for discharge on coumadin for 6 months. VVS deferring new access until PE situation stabilized. Venogram done which was negative for central stenosis.  Kristine Moselle  MD [facility] [phone] pgr    [phone] cell 02/29/2012, 11:10 AM

## 2012-02-29 NOTE — Progress Notes (Signed)
   ANTICOAGULATION CONSULT NOTE - Follow Up Consult  Pharmacy Consult for Heparin and Coumadin Indication: Suspected PE  Allergies  Allergen Reactions  . Zolpidem Tartrate Other (See Comments)    hallucinations    Labs:  [facility] 02/29/12 0600 02/28/12 1039 02/28/12 0930 02/28/12 0800 02/27/12 0610  HGB 10.1* -- -- 9.9* --  HCT 30.1* -- -- 30.3* 35.4*  PLT 153 -- -- 148* 179  APTT -- -- -- -- --  LABPROT 13.4 13.5 -- 17.2* --  INR 1.03 1.04 -- 1.44 --  HEPARINUNFRC 0.33 0.52 -- -- 0.38  CREATININE -- -- 10.57* -- 7.04*  CKTOTAL -- -- -- -- --  CKMB -- -- -- -- --  TROPONINI -- -- -- -- --    Estimated Creatinine Clearance: 9.6 ml/min (by C-G formula based on Cr of 10.57).   Assessment: 36yof on Heparin bridging to Coumadin (Day 3 of minimum 5 Day overlap) for CTA suspected PE.   Heparin level = 0.33 (therapeutic, but on low end) No increase in INR = 1.03 today  No significant bleeding reported  Goal of Therapy:  INR 2-3 Heparin level 0.3-0.7 units/ml Monitor platelets by anticoagulation protocol: Yes   Plan:  1. Increase heparin drip 1750 units/hr (17.5 ml/hr) 2. Coumadin 10 mg po x 1 today 3. Daily INR, heparin level, CBC 4. Monitor for s/sx of bleeding  Okey Regal, PharmD [phone]  02/29/2012,10:48 AM

## 2012-03-01 LAB — CBC
Hemoglobin: 9.4 g/dL — ABNORMAL LOW (ref 12.0–15.0)
MCHC: 33.8 g/dL (ref 30.0–36.0)
RDW: 15.2 % (ref 11.5–15.5)
WBC: 5.7 10*3/uL (ref 4.0–10.5)

## 2012-03-01 LAB — PROTIME-INR: Prothrombin Time: 14.8 seconds (ref 11.6–15.2)

## 2012-03-01 LAB — HEPARIN LEVEL (UNFRACTIONATED): Heparin Unfractionated: 0.28 IU/mL — ABNORMAL LOW (ref 0.30–0.70)

## 2012-03-01 MED ORDER — WARFARIN SODIUM 2.5 MG PO TABS
12.5000 mg | ORAL_TABLET | Freq: Once | ORAL | Status: AC
  Administered 2012-03-01: 12.5 mg via ORAL
  Filled 2012-03-01: qty 1

## 2012-03-01 MED ORDER — HEPARIN SODIUM (PORCINE) 1000 UNIT/ML DIALYSIS
20.0000 [IU]/kg | INTRAMUSCULAR | Status: DC | PRN
  Filled 2012-03-01: qty 3

## 2012-03-01 NOTE — Progress Notes (Signed)
   ANTICOAGULATION CONSULT NOTE - Follow Up Consult  Pharmacy Consult for Heparin and Coumadin Indication: Suspected PE  Allergies  Allergen Reactions  . Zolpidem Tartrate Other (See Comments)    hallucinations    Labs:  Bas[facility] 03/01/12 0625 02/29/12 0600 02/28/12 1039 02/28/12 0930 02/28/12 0800  HGB 9.4* 10.1* -- -- --  HCT 27.8* 30.1* -- -- 30.3*  PLT 139* 153 -- -- 148*  APTT -- -- -- -- --  LABPROT 14.8 13.4 13.5 -- --  INR 1.18 1.03 1.04 -- --  HEPARINUNFRC 0.28* 0.33 0.52 -- --  CREATININE -- -- -- 10.57* --  CKTOTAL -- -- -- -- --  CKMB -- -- -- -- --  TROPONINI -- -- -- -- --    Estimated Creatinine Clearance: 9.9 ml/min (by C-G formula based on Cr of 10.57).   Assessment: 36yof on Heparin bridging to Coumadin (Day 4 of minimum 5 Day overlap) for CTA suspected PE.   Heparin level = 0.28   No significant increase in INR = 1.18 today  No significant bleeding reported  Goal of Therapy:  INR 2-3 Heparin level 0.3-0.7 units/ml Monitor platelets by anticoagulation protocol: Yes   Plan:  1. Increase heparin drip 1900 units/hr (19 ml/hr) 2. Coumadin 12.5 mg po x 1 today 3. Daily INR, heparin level, CBC 4. Monitor for s/sx of bleeding  Okey Regal, PharmD [phone]  03/01/2012,11:05 AM

## 2012-03-01 NOTE — Progress Notes (Signed)
[facility] Progress Note  Subjective:  Feels good.  No problems with HD.  Just waiting for INR to be up high enough to go home.  Objective Filed Vitals:   02/29/12 1400 02/29/12 1646 03/01/12 0001 03/01/12 0548  BP: 112/71 123/73 124/79 138/81  Pulse: 85 93 81 89  Temp: 98 F (36.7 C) 99.1 F (37.3 C) 97.4 F (36.3 C) 98 F (36.7 C)  TempSrc: Oral  Oral Oral  Resp: 20 18 18 17   Height:      Weight:   132.6 kg (292 lb 5.3 oz)   SpO2: 96% 97% 99% 97%   Physical Exam General: Sitting in bed working on computer Heart: RRR Lungs: no wheezes or rales Abdomen: soft obese Extremities: no significant LE edema Dialysis Access: left I-J  OUTPATIENT HD: MWF, 4.5 hr, [facility], 126.5kg EDW. Zemplar 2ug, EPO 7800u, IV iron q Wed. Heparin 13000u then 2000 mid Rx. 2.5Ca/2.0K bath. L IJ TDC.   Assessment/Plan:  1. Pulmonary embolism w hemoptysis- no further hemoptysis.  IV hep/coumadin per pharmacy. No dyspnea or hypoxemia. Unable to do LE venous dopplers due to technical factors.  INRs to be monitored after discharge at her dialysis center. Slow response to coumadin. 2. ESRD, hd mwf. -    Bed scales do not = standing weights post HD wt HD 124 - Friday;  Still needs permanent access - has clotted multiple accesses in the past. bilateral upper extremity venogram showed no evidence of central stenosis; will need permanent access when stable from PE  3. HTN.vol- cont lisinopril 40pm/20am, amlodipine 10/hs as at home.  Well controlled in hospital with correct diet, meds, fluid restriction and getting to and below EDW (stays on full HD treaments) 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 Last PTH 136. P down to 6.9. Recheck with pre HD labs Monday 5. Anemia of CKD- cont Aranesp 60, last tsat 41% with ferritin in 1600s - no IV Fe . Hgb down to 9.4 from 10.8.  6. Morbid obesity/Nutrition - change to high protein renal diet and added  multivitamin  Sheffield Slider, PA-C  [facility] [phone]  03/01/2012,9:36 AM  LOS: 6 days   Patient seen and examined and agree with assessment and plan as above.  Vinson Moselle  MD [facility] [phone] pgr    [phone] cell 03/01/2012, 12:21 PM   Additional Objective Labs: Lab Results  Component Value Date   INR 1.18 03/01/2012   INR 1.03 02/29/2012   INR 1.04 02/28/2012    Basic Metabolic Panel:  Lab 02/28/12 4696 02/27/12 0610 02/26/12 0910  NA 138 138 135  K 4.0 4.0 4.2  CL 99 98 94*  CO2 23 23 23   GLUCOSE 105* 93 108*  BUN 50* 29* 54*  CREATININE 10.57* 7.04* 11.38*  CALCIUM 9.1 8.8 9.1  ALB -- -- --  PHOS 6.9* -- 9.4*   Liver Function Tests:  Lab 02/28/12 0930 02/26/12 0910  AST -- --  ALT -- --  ALKPHOS -- --  BILITOT -- --  PROT -- --  ALBUMIN 2.9* 2.9*   CBC:  Lab 03/01/12 0625 02/29/12 0600 02/28/12 0800 02/27/12 0610 02/26/12 0615 02/24/12 1118  WBC 5.7 6.2 5.6 -- -- --  NEUTROABS -- -- -- -- -- 6.8  HGB 9.4* 10.1* 9.9* -- -- --  HCT 27.8* 30.1* 30.3* -- -- --  MCV 84.2 85.0 83.9 83.7 82.4 --  PLT  139* 153 148* -- -- --  Medications:    . heparin 1,750 Units/hr (02/29/12 2020)      . amLODipine  10 mg Oral QHS  . cinacalcet  90 mg Oral QODAY  . darbepoetin  60 mcg Intravenous Q7 days  . heparin  20 Units/kg Dialysis Once in dialysis  . influenza  inactive virus vaccine  0.5 mL Intramuscular Tomorrow-1000  . lanthanum  2,000 mg Oral TID WC  . lisinopril  20 mg Oral q morning - 10a  . lisinopril  40 mg Oral QHS  . multivitamin  1 tablet Oral QHS  . paricalcitol  2 mcg Intravenous 3 times weekly  . sodium chloride  3 mL Intravenous Q12H  . warfarin  10 mg Oral ONCE-1800  . Warfarin - Pharmacist Dosing Inpatient   Does not apply [phone]

## 2012-03-01 NOTE — Progress Notes (Signed)
[facility] Progress Note    Kristine Rios JXB:[id number] DOB: 13-Aug-1975 DOA: 02/24/2012 PCP: Kathreen Cosier, MD  Brief narrative: 36 year old female patient on chronic dialysis who presented to the emergency department after developing hemoptysis. She endorsed nonproductive cough for 2 days prior to this. No other significant symptoms such a shortness of breath, leg swelling, chest pain. She also was not taking any anticoagulant medications or utilizing any antiplatelet drugs. In the emergency department chest x-ray demonstrated mild edema. CT angiography of the chest was completed and this demonstrated high index of suspicion for left lower lobe pulmonary embolus. After she presented to the emergency department her hemoptysis spontaneously resolved but after the heparin drip was initiated in the emergency department her hemoptysis recurred. She was otherwise hemodynamically stable and her hemoglobin was stable. She was subsequently admitted to the step down unit for further monitoring and treatment  Assessment/Plan:   *Acute pulmonary embolism *Likely related to patient's obesity and suspected underlying venous insufficiency *Patient has a history of multiple clotted dialysis access grafts and given her presentation with pulmonary embolism this is concerning for possible underlying hypercoagulable disorder. Lupus anticoagulant is negative, beta 2 glycoprotein negative, cardiolipin Ig  A weekly positive, factor V leiden negative, homocysteine elevated.   Patient likely will need formal hematological evaluation after complete 6 months of anticoagulation *Lower extremity Doppler studies were completed but were inconclusive due to patient's body habitus * ECHO ordered to evaluate for PFO- pt refused.  - on Heparin iv since admission  Coumadin started on 10/24 - INR slowly trending up     CKD (chronic kidney disease) stage V requiring chronic dialysis - HD M/W/F  - venogram 10/24 -  no central occlusion    Hyperkalemia *Improved after treatment   Systolic murmur *Patient states has had a murmur for some time *Echocardiogram was ordered but patient refused   Morbid obesity with BMI of 50.0-59.9, adult *Suspect primary contributing factor to patient's embolism   DVT prophylaxis: On full dose heparin Code Status: Full Family Communication: Spoke with patient Disposition Plan: home once [employer]  Consultants: Nephrology  Procedures: Venogram   Antibiotics: None  HPI/Subjective: Doing well without new events    Objective: Blood pressure 140/76, pulse 90, temperature 98.8 F (37.1 C), temperature source Oral, resp. rate 18, height 5\' 3"  (1.6 m), weight 132.6 kg (292 lb 5.3 oz), last menstrual period 02/20/2012, SpO2 98.00%.  Intake/Output Summary (Last 24 hours) at 03/01/12 1539 Last data filed at 03/01/12 0900  Gross per 24 hour  Intake 1488.59 ml  Output      0 ml  Net 1488.59 ml     Exam: General: No acute respiratory distress, obese Lungs: Clear to auscultation bilaterally without wheezes or crackles, on room air Cardiovascular: Regular rate and rhythm without murmur gallop or rub normal S1 and S2 Abdomen: Nontender, nondistended, soft, bowel sounds positive, no rebound, no ascites, no appreciable mass   Data Reviewed: Basic Metabolic Panel:  Lab 02/28/12 1308 02/27/12 0610 02/26/12 0910 02/25/12 0535 02/24/12 1155  NA 138 138 135 136 137  K 4.0 4.0 4.2 3.9 5.3*  CL 99 98 94* 96 97  CO2 23 23 23 24  16*  GLUCOSE 105* 93 108* 106* 98  BUN 50* 29* 54* 36* 95*  CREATININE 10.57* 7.04* 11.38* 8.01* 14.49*  CALCIUM 9.1 8.8 9.1 8.9 8.7  MG -- -- -- -- --  PHOS 6.9* -- 9.4* -- --   Liver Function Tests:  Lab 02/28/12 0930 02/26/12 0910  AST -- --  ALT -- --  ALKPHOS -- --  BILITOT -- --  PROT -- --  ALBUMIN 2.9* 2.9*   No results found for this basename: LIPASE:5,AMYLASE:5 in the last 168 hours No results found for this basename:  AMMONIA:5 in the last 168 hours CBC:  Lab 03/01/12 0625 02/29/12 0600 02/28/12 0800 02/27/12 0610 02/26/12 0615 02/24/12 1118  WBC 5.7 6.2 5.6 6.9 6.5 --  NEUTROABS -- -- -- -- -- 6.8  HGB 9.4* 10.1* 9.9* 11.5* 10.8* --  HCT 27.8* 30.1* 30.3* 35.4* 32.3* --  MCV 84.2 85.0 83.9 83.7 82.4 --  PLT 139* 153 148* 179 157 --   Results for Kristine, Rios (MRN [id number]) as of 02/29/2012 08:51  Ref. Range 02/28/2012 10:39 02/29/2012 06:00  Prothrombin Time Latest Range: 11.6-15.2 seconds 13.5 13.4  INR Latest Range: 0.00-1.49  1.04 1.03    Recent Results (from the past 240 hour(s))  MRSA PCR SCREENING     Status: Normal   Collection Time   02/24/12  6:38 PM      Component Value Range Status Comment   MRSA by PCR NEGATIVE  NEGATIVE Final      Studies:  Recent x-ray studies have been reviewed in detail by the Attending Physician  Scheduled Meds:     . amLODipine  10 mg Oral QHS  . cinacalcet  90 mg Oral QODAY  . darbepoetin  60 mcg Intravenous Q7 days  . heparin  20 Units/kg Dialysis Once in dialysis  . lanthanum  2,000 mg Oral TID WC  . lisinopril  20 mg Oral q morning - 10a  . lisinopril  40 mg Oral QHS  . multivitamin  1 tablet Oral QHS  . paricalcitol  2 mcg Intravenous 3 times weekly  . sodium chloride  3 mL Intravenous Q12H  . warfarin  10 mg Oral ONCE-1800  . warfarin  12.5 mg Oral ONCE-1800  . Warfarin - Pharmacist Dosing Inpatient   Does not apply q1800      [facility]  [facility] Office  [phone] Pager [phone]  On-Call/Text Page:      [email or web address]      password [email or web address]  If 7PM-7AM, please contact night-coverage [email or web address] Password [email or web address] 03/01/2012, 3:39 PM   LOS: 6 days

## 2012-03-02 ENCOUNTER — Inpatient Hospital Stay (HOSPITAL_COMMUNITY): Payer: Medicare Other

## 2012-03-02 DIAGNOSIS — Z6841 Body Mass Index (BMI) 40.0 and over, adult: Secondary | ICD-10-CM

## 2012-03-02 DIAGNOSIS — I2699 Other pulmonary embolism without acute cor pulmonale: Principal | ICD-10-CM

## 2012-03-02 DIAGNOSIS — N186 End stage renal disease: Secondary | ICD-10-CM

## 2012-03-02 DIAGNOSIS — D649 Anemia, unspecified: Secondary | ICD-10-CM

## 2012-03-02 DIAGNOSIS — R042 Hemoptysis: Secondary | ICD-10-CM

## 2012-03-02 LAB — CBC
HCT: 27.7 % — ABNORMAL LOW (ref 36.0–46.0)
Hemoglobin: 9.1 g/dL — ABNORMAL LOW (ref 12.0–15.0)
MCH: 27.2 pg (ref 26.0–34.0)
MCHC: 32.9 g/dL (ref 30.0–36.0)
MCV: 82.7 fL (ref 78.0–100.0)
Platelets: 149 10*3/uL — ABNORMAL LOW (ref 150–400)
RBC: 3.35 MIL/uL — ABNORMAL LOW (ref 3.87–5.11)
RDW: 15.1 % (ref 11.5–15.5)
WBC: 5.4 10*3/uL (ref 4.0–10.5)

## 2012-03-02 LAB — RENAL FUNCTION PANEL
BUN: 66 mg/dL — ABNORMAL HIGH (ref 6–23)
Calcium: 8.7 mg/dL (ref 8.4–10.5)
Creatinine, Ser: 12.14 mg/dL — ABNORMAL HIGH (ref 0.50–1.10)
Glucose, Bld: 88 mg/dL (ref 70–99)
Phosphorus: 4.8 mg/dL — ABNORMAL HIGH (ref 2.3–4.6)

## 2012-03-02 LAB — HEPARIN LEVEL (UNFRACTIONATED)
Heparin Unfractionated: 0.63 IU/mL (ref 0.30–0.70)
Heparin Unfractionated: 0.56 IU/mL (ref 0.30–0.70)

## 2012-03-02 MED ORDER — DARBEPOETIN ALFA-POLYSORBATE 60 MCG/0.3ML IJ SOLN
INTRAMUSCULAR | Status: AC
  Administered 2012-03-02: 60 ug via INTRAVENOUS
  Filled 2012-03-02: qty 0.3

## 2012-03-02 MED ORDER — PARICALCITOL 5 MCG/ML IV SOLN
INTRAVENOUS | Status: AC
  Administered 2012-03-02: 2 ug via INTRAVENOUS
  Filled 2012-03-02: qty 1

## 2012-03-02 MED ORDER — WARFARIN SODIUM 2.5 MG PO TABS
12.5000 mg | ORAL_TABLET | Freq: Once | ORAL | Status: AC
  Administered 2012-03-02: 12.5 mg via ORAL
  Filled 2012-03-02: qty 1

## 2012-03-02 NOTE — Progress Notes (Signed)
ANTICOAGULATION CONSULT NOTE - Follow Up Consult  Pharmacy Consult for Heparin + Warfarin Indication: Suspected PE  Allergies  Allergen Reactions  . Zolpidem Tartrate Other (See Comments)    hallucinations    Patient Measurements: Height: 5\' 3"  (160 cm) Weight: 281 lb 12 oz (127.8 kg) IBW/kg (Calculated) : 52.4  Heparin Dosing Weight: 85.9 kg  Labs:  Basename 03/02/12 1733 03/02/12 0639 03/02/12 0500 03/01/12 0625 02/29/12 0600  HGB -- -- 9.1* 9.4* --  HCT -- -- 27.7* 27.8* 30.1*  PLT -- -- 149* 139* 153  APTT -- -- -- -- --  LABPROT -- -- 16.9* 14.8 13.4  INR -- -- 1.41 1.18 1.03  HEPARINUNFRC 0.56 -- 0.63 0.28* --  CREATININE -- 12.14* -- -- --  CKTOTAL -- -- -- -- --  CKMB -- -- -- -- --  TROPONINI -- -- -- -- --    Estimated Creatinine Clearance: 8.4 ml/min (by C-G formula based on Cr of 12.14).   Medications:  Infusions:     . heparin 1,800 Units/hr (03/02/12 1448)    Assessment: 36 y.o. F on heparin + warfarin for suspected PE with a therapeutic heparin level (0.63, goal of 0.3-0.7) and SUBtherapeutic INR this morning -- though trending up (INR 1.41, goal of 2-3). Today is VTE overlap D#5/5 minimum recommended per CHEST guidelines however best practice is to continue bridging until INR therapeutic x 24 hours.   The patient was noted to have a small amount of hemoptysis this morning and per discussion with the the renal PA Doran Durand) -- it was decided to aim for the lower end of the therapeutic range of heparin of 0.3-0.5. Heparin level this evening = 0.56.  Goal of Therapy:  INR 2-3 Heparin level 0.3-0.5 units/ml Monitor platelets by anticoagulation protocol: Yes   Plan:  1. Decrease heparin to 1750 units/hr and recheck level in am.   03/02/2012 6:48 PM

## 2012-03-02 NOTE — Progress Notes (Signed)
Hemodialysis-Pharmacy contacted per Dr. Lavera Guise to evaluate heparin drip. Will decrease to 1800 units/hr per pharmacy.

## 2012-03-02 NOTE — Progress Notes (Signed)
[facility] Progress Note    Kristine Rios:[id number] DOB: 04/03/1976 DOA: 02/24/2012 PCP: Kathreen Cosier, MD  Brief narrative: 36 year old female patient on chronic dialysis who presented to the emergency department after developing hemoptysis. She endorsed nonproductive cough for 2 days prior to this. No other significant symptoms such a shortness of breath, leg swelling, chest pain. She also was not taking any anticoagulant medications or utilizing any antiplatelet drugs. In the emergency department chest x-ray demonstrated mild edema. CT angiography of the chest was completed and this demonstrated high index of suspicion for left lower lobe pulmonary embolus. After she presented to the emergency department her hemoptysis spontaneously resolved but after the heparin drip was initiated in the emergency department her hemoptysis recurred. She was otherwise hemodynamically stable and her hemoglobin was stable. She was subsequently admitted to the step down unit for further monitoring and treatment  Assessment/Plan:   *Acute pulmonary embolism with hemoptysis on admission  *Likely related to patient's obesity and suspected underlying venous insufficiency *Patient has a history of multiple clotted dialysis access grafts and given her presentation with pulmonary embolism this is concerning for possible underlying hypercoagulable disorder. Lupus anticoagulant is negative, beta 2 glycoprotein negative, cardiolipin Ig  A weekly positive, factor V leiden negative, homocysteine elevated.   Patient likely will need formal hematological evaluation after complete 6 months of anticoagulation *Lower extremity Doppler studies were completed but were inconclusive due to patient's body habitus She had an initial episode of hemoptysis on 02/24/12 then a second one 03/02/12  - on Heparin iv since admission  Coumadin started on 10/24 - INR slowly trending up  Appreciate pulm/ccm following along and  recs   CKD (chronic kidney disease) stage V requiring chronic dialysis - HD M/W/F  - venogram 10/24 - no central occlusion    Hyperkalemia *Improved after treatment   Systolic murmur *Patient states has had a murmur for some time *Echocardiogram was ordered but patient refused   Morbid obesity with BMI of 50.0-59.9, adult *Suspect primary contributing factor to patient's embolism   DVT prophylaxis: On full dose heparin Code Status: Full Family Communication: Spoke with patient Disposition Plan: home once [employer]  Consultants: Nephrology  Procedures: Venogram   Antibiotics: None  HPI/Subjective: Had hemoptysis during Hd today    Objective: Blood pressure 154/88, pulse 88, temperature 98.4 F (36.9 C), temperature source Oral, resp. rate 16, height 5\' 3"  (1.6 m), weight 133.6 kg (294 lb 8.6 oz), last menstrual period 35/17/2013, SpO2 93.00%.  Intake/Output Summary (Last 24 hours) at 03/02/12 8119 Last data filed at 03/02/12 0655  Gross per 24 hour  Intake 1425.49 ml  Output      0 ml  Net 1425.49 ml     Exam: General: No acute respiratory distress, obese Lungs: Clear to auscultation bilaterally without wheezes or crackles, on room air Cardiovascular: Regular rate and rhythm without murmur gallop or rub normal S1 and S2 Abdomen: Nontender, nondistended, soft, bowel sounds positive, no rebound, no ascites, no appreciable mass   Data Reviewed: Basic Metabolic Panel:  Lab 03/02/12 1478 02/28/12 0930 02/27/12 0610 02/26/12 0910 02/25/12 0535  NA 133* 138 138 135 136  K 4.3 4.0 4.0 4.2 3.9  CL 96 99 98 94* 96  CO2 22 23 23 23 24   GLUCOSE 88 105* 93 108* 106*  BUN 66* 50* 29* 54* 36*  CREATININE 12.14* 10.57* 7.04* 11.38* 8.01*  CALCIUM 8.7 9.1 8.8 9.1 8.9  MG -- -- -- -- --  PHOS 4.8* 6.9* --  9.4* --   Liver Function Tests:  Lab 03/02/12 0639 02/28/12 0930 02/26/12 0910  AST -- -- --  ALT -- -- --  ALKPHOS -- -- --  BILITOT -- -- --  PROT -- -- --    ALBUMIN 2.7* 2.9* 2.9*   No results found for this basename: LIPASE:5,AMYLASE:5 in the last 168 hours No results found for this basename: AMMONIA:5 in the last 168 hours CBC:  Lab 03/02/12 0500 03/01/12 0625 02/29/12 0600 02/28/12 0800 02/27/12 0610 02/24/12 1118  WBC 5.4 5.7 6.2 5.6 6.9 --  NEUTROABS -- -- -- -- -- 6.8  HGB 9.1* 9.4* 10.1* 9.9* 11.5* --  HCT 27.7* 27.8* 30.1* 30.3* 35.4* --  MCV 82.7 84.2 85.0 83.9 83.7 --  PLT 149* 139* 153 148* 179 --   Results for ROSHANA, Rios (MRN [id number]) as of 03/02/2012 08:23  Ref. Range 02/28/2012 10:39 02/29/2012 06:00 03/01/2012 06:25 03/02/2012 05:00 03/02/2012 06:39  Heparin Unfractionated Latest Range: 0.30-0.70 IU/mL 0.52 0.33 0.28 (L) 0.63   Prothrombin Time Latest Range: 11.6-15.2 seconds 13.5 13.4 14.8 16.9 (H)   INR Latest Range: 0.00-1.49  1.04 1.03 1.18 1.41    Recent Results (from the past 240 hour(s))  MRSA PCR SCREENING     Status: Normal   Collection Time   02/24/12  6:38 PM      Component Value Range Status Comment   MRSA by PCR NEGATIVE  NEGATIVE Final      Studies:  Recent x-ray studies have been reviewed in detail by the Attending Physician  Scheduled Meds:     . amLODipine  10 mg Oral QHS  . cinacalcet  90 mg Oral QODAY  . darbepoetin      . darbepoetin  60 mcg Intravenous Q7 days  . heparin  20 Units/kg Dialysis Once in dialysis  . lanthanum  2,000 mg Oral TID WC  . lisinopril  20 mg Oral q morning - 10a  . lisinopril  40 mg Oral QHS  . multivitamin  1 tablet Oral QHS  . paricalcitol      . paricalcitol  2 mcg Intravenous 3 times weekly  . sodium chloride  3 mL Intravenous Q12H  . warfarin  12.5 mg Oral ONCE-1800  . Warfarin - Pharmacist Dosing Inpatient   Does not apply q1800      [facility]  [facility] Office  [phone] Pager [phone]  On-Call/Text Page:      [email or web address]      password [email or web address]  If 7PM-7AM, please contact night-coverage [email or web address] Password  [email or web address] 03/02/2012, 8:22 AM   LOS: 7 days

## 2012-03-02 NOTE — Progress Notes (Addendum)
Kristine Rios Progress Note  Subjective:  Hemoptysis 1 tsp this am.  No SOB or chest pain. Feels fine otherwise.  Objective Filed Vitals:   03/02/12 0730 03/02/12 0800 03/02/12 0830 03/02/12 0900  BP: 142/85 154/88 131/81 142/96  Pulse: 79 88 79 66  Temp:      TempSrc:      Resp:      Height:      Weight:      SpO2:       Physical Exam General: NAD on HD Heart: RRR Lungs:no wheezes or rales Abdomen: obese Extremities: no significant LE edema Dialysis Access: left I-J catheter Qb 400  OUTPATIENT HD: MWF, 4.5 hr, [facility], 126.5kg EDW. Zemplar 2ug, EPO 7800u, IV iron q Wed. Heparin 13000u then 2000 mid Rx. 2.5Ca/2.0K bath. L IJ TDC.   Assessment/Plan:  1. Pulmonary embolism w hemoptysis- Small hemoptysis this am. IV hep/coumadin per pharmacy. No dyspnea or hypoxemia. Unable to do LE venous dopplers due to technical factors. INRs to be monitored after discharge at her dialysis center. INR coming up.  High acceptable PTT.  Did not receive heparin with HD today. Pharmacy evaluating heparin dose. 2. ESRD, hd mwf. - Still needs permanent access - has clotted multiple accesses in the past. bilateral upper extremity venogram showed no evidence of central stenosis; will need permanent access when stable from PE K 4.3 today  3. HTN.vol- cont lisinopril 40pm/20am, amlodipine 10/hs as at home.  Volume up quite a bit.  Did not set for full goal today. 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 Last PTH 136. P down to 4.8 - good control in hospital environment. 5. Anemia of CKD- cont Aranesp 60, last tsat 41% with ferritin in 1600s - no IV Fe . Hgb down to 9.1 from 10.8, but volume up quite a bit.  Recheck CBC in am. 6. Morbid obesity/Nutrition - change to high protein renal diet and added multivitamin  Sheffield Slider, PA-C [facility] [phone]  03/02/2012,9:30 AM  LOS: 7 days    Additional Objective Labs: Lab  Results  Component Value Date   INR 1.41 03/02/2012   INR 1.18 03/01/2012   INR 1.03 02/29/2012    Ref. Range 02/28/2012 10:39 02/29/2012 06:00 03/01/2012 06:25 03/02/2012 05:00 03/02/2012 06:39  Heparin Unfractionated Latest Range: 0.30-0.70 IU/mL 0.52 0.33 0.28 (L) 0.63     Basic Metabolic Panel:  Lab 03/02/12 9147 02/28/12 0930 02/27/12 0610 02/26/12 0910  NA 133* 138 138 --  K 4.3 4.0 4.0 --  CL 96 99 98 --  CO2 22 23 23  --  GLUCOSE 88 105* 93 --  BUN 66* 50* 29* --  CREATININE 12.14* 10.57* 7.04* --  CALCIUM 8.7 9.1 8.8 --  ALB -- -- -- --  PHOS 4.8* 6.9* -- 9.4*   Liver Function Tests:  Lab 03/02/12 0639 02/28/12 0930 02/26/12 0910  AST -- -- --  ALT -- -- --  ALKPHOS -- -- --  BILITOT -- -- --  PROT -- -- --  ALBUMIN 2.7* 2.9* 2.9*   CBC:  Lab 03/02/12 0500 03/01/12 0625 02/29/12 0600 02/28/12 0800 02/27/12 0610 02/24/12 1118  WBC 5.4 5.7 6.2 -- -- --  NEUTROABS -- -- -- -- -- 6.8  HGB 9.1* 9.4* 10.1* -- -- --  HCT 27.7* 27.8* 30.1* -- -- --  MCV 82.7 84.2 85.0 83.9 83.7 --  PLT 149* 139* 153 -- -- --  Medications:    .  heparin 1,900 Units/hr (03/01/12 2337)      . amLODipine  10 mg Oral QHS  . cinacalcet  90 mg Oral QODAY  . darbepoetin      . darbepoetin  60 mcg Intravenous Q7 days  . heparin  20 Units/kg Dialysis Once in dialysis  . lanthanum  2,000 mg Oral TID WC  . lisinopril  20 mg Oral q morning - 10a  . lisinopril  40 mg Oral QHS  . multivitamin  1 tablet Oral QHS  . paricalcitol      . paricalcitol  2 mcg Intravenous 3 times weekly  . sodium chloride  3 mL Intravenous Q12H  . warfarin  12.5 mg Oral ONCE-1800  . warfarin  12.5 mg Oral ONCE-1800  . Warfarin - Pharmacist Dosing Inpatient   Does not apply [phone]

## 2012-03-02 NOTE — Progress Notes (Signed)
Utilization review completed.  

## 2012-03-02 NOTE — Progress Notes (Signed)
I have seen and examined this patient and agree with plan as outlined by Audree Bane, PA-C. Janique Hoefer A,MD 03/02/2012 10:40 AM

## 2012-03-02 NOTE — Progress Notes (Signed)
Hemodialysis-Dr. Lavera Guise notified of pt coughing up bright red blood, approx 5ml. No further orders received. Renal MD notified as well.
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
## 2012-03-02 NOTE — Progress Notes (Signed)
ANTICOAGULATION CONSULT NOTE - Follow Up Consult  Pharmacy Consult for Heparin + Warfarin Indication: Suspected PE  Allergies  Allergen Reactions  . Zolpidem Tartrate Other (See Comments)    hallucinations    Patient Measurements: Height: 5\' 3"  (160 cm) Weight: 294 lb 8.6 oz (133.6 kg) (standing scale) IBW/kg (Calculated) : 52.4  Heparin Dosing Weight: 85.9 kg  Labs:  Basename 03/02/12 0639 03/02/12 0500 03/01/12 0625 02/29/12 0600 02/28/12 0930  HGB -- 9.1* 9.4* -- --  HCT -- 27.7* 27.8* 30.1* --  PLT -- 149* 139* 153 --  APTT -- -- -- -- --  LABPROT -- 16.9* 14.8 13.4 --  INR -- 1.41 1.18 1.03 --  HEPARINUNFRC -- 0.63 0.28* 0.33 --  CREATININE 12.14* -- -- -- 10.57*  CKTOTAL -- -- -- -- --  CKMB -- -- -- -- --  TROPONINI -- -- -- -- --    Estimated Creatinine Clearance: 8.7 ml/min (by C-G formula based on Cr of 12.14).   Medications:  Infusions:    . heparin 1,900 Units/hr (03/01/12 2337)    Assessment: 36 y.o. F on heparin + warfarin for suspected PE with a therapeutic heparin level (0.63, goal of 0.3-0.7) and SUBtherapeutic INR this morning -- though trending up (INR 1.41, goal of 2-3). Today is VTE overlap D#5/5 minimum recommended per CHEST guidelines however best practice is to continue bridging until INR therapeutic x 24 hours.   The patient was noted to have a small amount of hemoptysis this morning and per discussion with the the renal PA Doran Durand) -- it was decided to aim for the lower end of the therapeutic range of heparin of 0.3-0.5. Will decrease the heparin rate this morning and plan to recheck a level this afternoon to confirm appropriate.  Goal of Therapy:  INR 2-3 Heparin level 0.3-0.5 units/ml Monitor platelets by anticoagulation protocol: Yes   Plan:  1. Reduce heparin to 1800 units/hr (18 ml/hr) 2. Warfarin 12.5 mg x 1 dose at 1800 today 3. Will continue to monitor for any signs/symptoms of bleeding and will follow up with heparin level in  8 hours and PT/INR in the a.m  Georgina Pillion, PharmD, BCPS Clinical Pharmacist Pager: [phone] 03/02/2012 8:47 AM

## 2012-03-02 NOTE — Procedures (Signed)
Patient was seen on dialysis and the procedure was supervised. BFR 400 Via PC BP is stable.  Patient appears to be tolerating treatment well.  Had some hemoptysis while on HD but no added heparin given as she is on an heparin drip.  May need to lower heparin gtt per Dr. Lavera Guise and pharmacy.

## 2012-03-03 LAB — CBC
HCT: 29.7 % — ABNORMAL LOW (ref 36.0–46.0)
Hemoglobin: 9.8 g/dL — ABNORMAL LOW (ref 12.0–15.0)
MCH: 27.5 pg (ref 26.0–34.0)
MCHC: 33 g/dL (ref 30.0–36.0)

## 2012-03-03 LAB — PROTIME-INR: Prothrombin Time: 19.2 seconds — ABNORMAL HIGH (ref 11.6–15.2)

## 2012-03-03 MED ORDER — DARBEPOETIN ALFA-POLYSORBATE 60 MCG/0.3ML IJ SOLN: 60.0000 ug | INTRAMUSCULAR | Status: DC

## 2012-03-03 MED ORDER — WARFARIN SODIUM 2.5 MG PO TABS
12.5000 mg | ORAL_TABLET | Freq: Once | ORAL | Status: AC
  Administered 2012-03-03: 12.5 mg via ORAL
  Filled 2012-03-03: qty 1

## 2012-03-03 NOTE — Progress Notes (Signed)
I have seen and examined this patient and agree with plan as outlined by Audree Bane, PA-C. Tekoa Amon A,MD 03/03/2012 11:05 AM

## 2012-03-03 NOTE — Consult Note (Signed)
Patient: Kristine Rios DOB: 1975/10/18 Date of Admission: 02/24/2012            Pulmonary consult  Date of Consult: 03/02/2012 MD requesting consult:  Lavera Guise ([facility])  Reason for consult:  Hemoptysis in setting anticoagulation / pe  HPI -  36yo female with morbid obesity (Body mass index is 49.91 kg/(m^2). ), hx ESRD on HD with hx multiple clotted dialysis access grafts presented 10/21 with cough, hemoptysis.  She was found to have multiple PE.  Hemodynamics remained stable and she was admitted by [facility] for anticoagulation.   Her hemoptysis initially resolved and she was started on coumadin with heparin gtt.  Continues to be subtherapeutic on coumadin and remains on heparin gtt.  However she had a further episode of hemoptysis 10/28  and PCCM consulted 10/28 to assist with further decisions regarding anticoagulation.   EVENTS 02/24/2012 > Admit    SUBJECTIVE/OVERNIGHT/INTERVAL HX  10/29 - feels well. On heparin. No further hemoptysis. Denies complaints  Filed Vitals:   03/02/12 1400 03/02/12 1800 03/02/12 2033 03/03/12 0450  BP: 124/84 128/95 142/81 132/88  Pulse: 85 92 90 82  Temp: 98.7 F (37.1 C)  99.2 F (37.3 C) 98.3 F (36.8 C)  TempSrc: Oral  Oral Oral  Resp: 18 18 18 18   Height:   5\' 3"  (1.6 m)   Weight:   127.8 kg (281 lb 12 oz)   SpO2: 95% 96% 95% 95%     EXAM: General:  Pleasant, morbidly obese female, NAD seen on HD  Neuro:  Awake, alert, appropriate, MAE CV:  s1s2 rrr PULM: resps even non labored on RA, diminished bases, otherwise clear GI: abd obese, soft, +bs Extremities:  Warm and dry, chronic BLE edema    chest X-ray Dg Chest Port 1 View  03/02/2012  *RADIOLOGY REPORT*  Clinical Data: Status post hemodialysis, bleeding  PORTABLE CHEST - 1 VIEW  Comparison: 02/24/2012  Findings: The heart and pulmonary vascularity are within normal limits.  A left dialysis catheter is again noted.  The lungs are clear.  IMPRESSION: No acute abnormality noted.   Original  Report Authenticated By: Phillips Odor, M.D.     CBC    Component Value Date/Time   WBC 4.8 03/03/2012 0500   RBC 3.57* 03/03/2012 0500   HGB 9.8* 03/03/2012 0500   HCT 29.7* 03/03/2012 0500   PLT 150 03/03/2012 0500   MCV 83.2 03/03/2012 0500   MCH 27.5 03/03/2012 0500   MCHC 33.0 03/03/2012 0500   RDW 15.2 03/03/2012 0500   LYMPHSABS 1.6 02/24/2012 1118   MONOABS 0.4 02/24/2012 1118   EOSABS 0.0 02/24/2012 1118   BASOSABS 0.0 02/24/2012 1118     BMET    Component Value Date/Time   NA 133* 03/02/2012 0639   K 4.3 03/02/2012 0639   CL 96 03/02/2012 0639   CO2 22 03/02/2012 0639   GLUCOSE 88 03/02/2012 0639   BUN 66* 03/02/2012 0639   CREATININE 12.14* 03/02/2012 0639   CALCIUM 8.7 03/02/2012 0639   GFRNONAA 4* 03/02/2012 0639   GFRAA 4* 03/02/2012 0639    Lab 03/03/12 0500 03/02/12 0500 03/01/12 0625  HGB 9.8* 9.1* 9.4*  HCT 29.7* 27.7* 27.8*  WBC 4.8 5.4 5.7  PLT 150 149* 139*    Lab 03/03/12 0500 03/02/12 0500 03/01/12 0625 02/29/12 0600 02/28/12 1039  INR 1.68* 1.41 1.18 1.03 1.04    IMPRESSION/ PLAN:  Acute PE  Hemoptysis - minimal.   BLE dopplers were inconclusive r/t pts  body habitus.  Lupus anticoag, factor V leiden and beta 2 glycoprotein all neg.  Homocysteine was mild elevated.  Coumadin initiated 10/24, remains on heparin gtt  overlap with INR 1.4 this am.  Echo ordered but pt refused    - on 03/03/2012 INR increased to 1.68 but no further hemoptysis with ? Pharmacy aiming for low end of therapeutic heparin range. CXR clear and does not suggest alveolar hemorrhage.  No endobronchial lesion on admit CT 02/24/12. Suspect hemoptysis either due to bronchitis or PE itself in setting of uremic platelet dysfn    PLAN -  Continue anticoagulation per protocol (hemoptysiPharmacy aiming for low end of therapeutic heparin range  Renal to consider the role of increased on dose HD and clearance = plat dysfunction? Monitor bleedig and cbce Would not  consider filter or failure to anticoagulation at  This stage  Dr. Kalman Shan, M.D., [facility].C.P Pulmonary and Critical Care Medicine Staff Physician [facility] Pulmonary and Critical Care Pager: [phone], If no answer or between  15:00h - 7:00h: call [phone]  03/03/2012 9:19 AM

## 2012-03-03 NOTE — Progress Notes (Signed)
ANTICOAGULATION CONSULT NOTE - Follow Up Consult  Pharmacy Consult for Heparin + Warfarin Indication: Suspected PE  Allergies  Allergen Reactions  . Zolpidem Tartrate Other (See Comments)    hallucinations    Patient Measurements: Height: 5\' 3"  (160 cm) Weight: 281 lb 12 oz (127.8 kg) IBW/kg (Calculated) : 52.4  Heparin Dosing Weight: 85.9 kg  Labs:  Basename 03/03/12 0500 03/02/12 1733 03/02/12 0639 03/02/12 0500 03/01/12 0625  HGB 9.8* -- -- 9.1* --  HCT 29.7* -- -- 27.7* 27.8*  PLT 150 -- -- 149* 139*  APTT -- -- -- -- --  LABPROT 19.2* -- -- 16.9* 14.8  INR 1.68* -- -- 1.41 1.18  HEPARINUNFRC 0.39 0.56 -- 0.63 --  CREATININE -- -- 12.14* -- --  CKTOTAL -- -- -- -- --  CKMB -- -- -- -- --  TROPONINI -- -- -- -- --    Estimated Creatinine Clearance: 8.4 ml/min (by C-G formula based on Cr of 12.14).   Medications:  Infusions:     . heparin 1,750 Units/hr (03/03/12 4696)    Assessment: 36 y.o. F on heparin + warfarin for suspected PE with a therapeutic heparin level (0.39, goal of 0.3-0.7) and SUBtherapeutic INR this morning -- though trending up (INR 1.68 << 1.41, goal of 2-3). The patient completed 5 days of minimum overlap recommended per CHEST guidelines on 10/28 however best practice is to continue bridging until INR therapeutic x 24 hours.   The patient was noted to have a small amount of hemoptysis (1 tablespoon) and 10/28 a.m however no further hemoptysis has been noted at this time. The heparin goal was reduced to aim for the lower end of the therapeutic range of heparin of 0.3-0.5 on 10/28. INR trending up nicely -- will continue current dosing. The patient has been educated on warfarin this admission  Goal of Therapy:  INR 2-3 Heparin level 0.3-0.5 units/ml Monitor platelets by anticoagulation protocol: Yes   Plan:  1.Continue heparin at current rate of 1750 units/hr (17.5 ml/hr) 2. Warfarin 12.5 mg x 1 dose at 1800 today 3. Will continue to monitor  for any signs/symptoms of bleeding and will follow up with heparin level in 8 hours and PT/INR in the a.m  Georgina Pillion, PharmD, BCPS Clinical Pharmacist Pager: [phone] 03/03/2012 8:56 AM

## 2012-03-03 NOTE — Progress Notes (Signed)
[facility] Progress Note    Kristine Rios BJY:[id number] DOB: 1975/11/25 DOA: 02/24/2012 PCP: Trevor Iha, MD  Brief narrative: 36 year old female patient on chronic dialysis who presented to the emergency department after developing hemoptysis. She endorsed nonproductive cough for 2 days prior to this. No other significant symptoms such a shortness of breath, leg swelling, chest pain. She also was not taking any anticoagulant medications or utilizing any antiplatelet drugs. In the emergency department chest x-ray demonstrated mild edema. CT angiography of the chest was completed and this demonstrated high index of suspicion for left lower lobe pulmonary embolus. After she presented to the emergency department her hemoptysis spontaneously resolved but after the heparin drip was initiated in the emergency department her hemoptysis recurred. She was otherwise hemodynamically stable and her hemoglobin was stable. She was subsequently admitted to the step down unit for further monitoring and treatment  Assessment/Plan:   *Acute pulmonary embolism with hemoptysis on admission  *Likely related to patient's obesity and suspected underlying venous insufficiency *Patient has a history of multiple clotted dialysis access grafts and given her presentation with pulmonary embolism this is concerning for possible underlying hypercoagulable disorder. Lupus anticoagulant is negative, beta 2 glycoprotein negative, cardiolipin Ig  A weekly positive, factor V leiden negative, homocysteine elevated.   Patient likely will need formal hematological evaluation after complete 6 months of anticoagulation *Lower extremity Doppler studies were completed but were inconclusive due to patient's body habitus She had an initial episode of hemoptysis on 02/24/12 then a second one 03/02/12  - on Heparin iv since admission  Coumadin started on 10/24 - INR slowly trending up  Appreciate pulm/ccm following along and  recs   CKD (chronic kidney disease) stage V requiring chronic dialysis - HD M/W/F  - venogram 10/24 - no central occlusion    Hyperkalemia *Improved after treatment   Systolic murmur *Patient states has had a murmur for some time *Echocardiogram was ordered but patient refused   Morbid obesity with BMI of 50.0-59.9, adult *Suspect primary contributing factor to patient's embolism   DVT prophylaxis: On full dose heparin Code Status: Full Family Communication:  patient Disposition Plan: home once INR>2  Consultants: Nephrology Pulmonary   Procedures: Venogram   Antibiotics: None  HPI/Subjective: No further hemoptysis    Objective: Blood pressure 130/82, pulse 78, temperature 98 F (36.7 C), temperature source Oral, resp. rate 18, height 5\' 3"  (1.6 m), weight 127.8 kg (281 lb 12 oz), last menstrual period 02/20/2012, SpO2 97.00%.  Intake/Output Summary (Last 24 hours) at 03/03/12 1700 Last data filed at 03/03/12 1300  Gross per 24 hour  Intake 1859.67 ml  Output      1 ml  Net 1858.67 ml     Exam: General: No acute respiratory distress, obese Lungs: Clear to auscultation bilaterally without wheezes or crackles, on room air Cardiovascular: Regular rate and rhythm without murmur gallop or rub normal S1 and S2 Abdomen: Nontender, nondistended, soft, bowel sounds positive, no rebound, no ascites, no appreciable mass   Data Reviewed: Basic Metabolic Panel:  Lab 03/02/12 0865 02/28/12 0930 02/27/12 0610 02/26/12 0910  NA 133* 138 138 135  K 4.3 4.0 4.0 4.2  CL 96 99 98 94*  CO2 22 23 23 23   GLUCOSE 88 105* 93 108*  BUN 66* 50* 29* 54*  CREATININE 12.14* 10.57* 7.04* 11.38*  CALCIUM 8.7 9.1 8.8 9.1  MG -- -- -- --  PHOS 4.8* 6.9* -- 9.4*   Liver Function Tests:  Lab 03/02/12 7846 02/28/12  0930 02/26/12 0910  AST -- -- --  ALT -- -- --  ALKPHOS -- -- --  BILITOT -- -- --  PROT -- -- --  ALBUMIN 2.7* 2.9* 2.9*   No results found for this basename:  LIPASE:5,AMYLASE:5 in the last 168 hours No results found for this basename: AMMONIA:5 in the last 168 hours CBC:  Lab 03/03/12 0500 03/02/12 0500 03/01/12 0625 02/29/12 0600 02/28/12 0800  WBC 4.8 5.4 5.7 6.2 5.6  NEUTROABS -- -- -- -- --  HGB 9.8* 9.1* 9.4* 10.1* 9.9*  HCT 29.7* 27.7* 27.8* 30.1* 30.3*  MCV 83.2 82.7 84.2 85.0 83.9  PLT 150 149* 139* 153 148*   Results for SAMYA, SICILIANO (MRN [id number]) as of 03/02/2012 08:23  Ref. Range 02/28/2012 10:39 02/29/2012 06:00 03/01/2012 06:25 03/02/2012 05:00 03/02/2012 06:39  Heparin Unfractionated Latest Range: 0.30-0.70 IU/mL 0.52 0.33 0.28 (L) 0.63   Prothrombin Time Latest Range: 11.6-15.2 seconds 13.5 13.4 14.8 16.9 (H)   INR Latest Range: 0.00-1.49  1.04 1.03 1.18 1.41    Recent Results (from the past 240 hour(s))  MRSA PCR SCREENING     Status: Normal   Collection Time   02/24/12  6:38 PM      Component Value Range Status Comment   MRSA by PCR NEGATIVE  NEGATIVE Final      Studies:  Recent x-ray studies have been reviewed in detail by the Attending Physician  Scheduled Meds:     . amLODipine  10 mg Oral QHS  . cinacalcet  90 mg Oral QODAY  . darbepoetin  60 mcg Intravenous Q Mon-HD  . heparin  20 Units/kg Dialysis Once in dialysis  . lanthanum  2,000 mg Oral TID WC  . lisinopril  20 mg Oral q morning - 10a  . lisinopril  40 mg Oral QHS  . multivitamin  1 tablet Oral QHS  . paricalcitol  2 mcg Intravenous 3 times weekly  . sodium chloride  3 mL Intravenous Q12H  . warfarin  12.5 mg Oral ONCE-1800  . warfarin  12.5 mg Oral ONCE-1800  . Warfarin - Pharmacist Dosing Inpatient   Does not apply q1800  . DISCONTD: darbepoetin  60 mcg Intravenous Q7 days      [facility]  [facility] Office  [phone] Pager [phone]  On-Call/Text Page:      [email or web address]      password [email or web address]  If 7PM-7AM, please contact night-coverage [email or web address] Password [email or web address] 03/03/2012, 5:00 PM   LOS: 8 days

## 2012-03-03 NOTE — Progress Notes (Signed)
[facility] Progress Note  Subjective:  No more hemoptysis. Feels good.  Objective Filed Vitals:   03/02/12 1400 03/02/12 1800 03/02/12 2033 03/03/12 0450  BP: 124/84 128/95 142/81 132/88  Pulse: 85 92 90 82  Temp: 98.7 F (37.1 C)  99.2 F (37.3 C) 98.3 F (36.8 C)  TempSrc: Oral  Oral Oral  Resp: 18 18 18 18   Height:   5\' 3"  (1.6 m)   Weight:   127.8 kg (281 lb 12 oz)   SpO2: 95% 96% 95% 95%   Physical Exam General:sitting in bed NAD Heart: RRR Lungs:no wheezes or rales Abdomen: obses soft Extremities: no LE edema Dialysis Access: left I-J  OUTPATIENT HD: MWF, 4.5 hr, [facility], 126.5kg EDW. Zemplar 2ug, EPO 7800u, IV iron q Wed. Heparin 13000u then 2000 mid Rx. 2.5Ca/2.0K bath. L IJ TDC.  Assessment/Plan:  1. Pulmonary embolism w hemoptysis- Small hemoptysis 10/28 - no further episodes. IV hep/coumadin per pharmacy. No dyspnea or hypoxemia. Unable to do LE venous dopplers due to technical factors. INRs to be monitored after discharge at her dialysis center. INR coming up 1.68 today.  .PTT. No heparin HD.   Pharmacy following. Neg cxr 10/28 Whether to resume heparin after discharge when therapeutic on coumadin. 2. ESRD, hd mwf. - Still needs permanent access - has clotted multiple accesses in the past. bilateral upper extremity venogram showed no evidence of central stenosis; will need permanent access when stable from PE. Plan HD Wed am. 3. HTN.vol- cont lisinopril 40pm/20am, amlodipine 10/hs as at home. Volume up Monday.. Attempt to get to EDW Wednesday. 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 Last PTH 136. P - good control in hospital environment. 5. Anemia of CKD- cont Aranesp 60, last tsat 41% with ferritin in 1600s - no IV Fe . Hgb 9.8 . 6. Morbid obesity/Nutrition -  high protein renal diet and added multivitamin  Sheffield Slider, PA-C [facility] Beeper [phone]  03/03/2012,9:22 AM  LOS: 8 days      Additional Objective Labs: Lab Results  Component Value Date   INR 1.68* 03/03/2012   INR 1.41 03/02/2012   INR 1.18 03/01/2012    Basic Metabolic Panel:  Lab 03/02/12 1478 02/28/12 0930 02/27/12 0610 02/26/12 0910  NA 133* 138 138 --  K 4.3 4.0 4.0 --  CL 96 99 98 --  CO2 22 23 23  --  GLUCOSE 88 105* 93 --  BUN 66* 50* 29* --  CREATININE 12.14* 10.57* 7.04* --  CALCIUM 8.7 9.1 8.8 --  ALB -- -- -- --  PHOS 4.8* 6.9* -- 9.4*   Liver Function Tests:  Lab 03/02/12 0639 02/28/12 0930 02/26/12 0910  AST -- -- --  ALT -- -- --  ALKPHOS -- -- --  BILITOT -- -- --  PROT -- -- --  ALBUMIN 2.7* 2.9* 2.9*   CBC:  Lab 03/03/12 0500 03/02/12 0500 03/01/12 0625 02/29/12 0600 02/28/12 0800  WBC 4.8 5.4 5.7 -- --  NEUTROABS -- -- -- -- --  HGB 9.8* 9.1* 9.4* -- --  HCT 29.7* 27.7* 27.8* -- --  MCV 83.2 82.7 84.2 85.0 83.9  PLT 150 149* 139* -- --  Studies/Results: Dg Chest Port 1 View  03/02/2012  *RADIOLOGY REPORT*  Clinical Data: Status post hemodialysis, bleeding  PORTABLE CHEST - 1 VIEW  Comparison: 02/24/2012  Findings: The heart and pulmonary vascularity are within normal limits.  A left dialysis catheter is  again noted.  The lungs are clear.  IMPRESSION: No acute abnormality noted.   Original Report Authenticated By: Phillips Odor, M.D.    Medications:    . heparin 1,750 Units/hr (03/03/12 4540)      . amLODipine  10 mg Oral QHS  . cinacalcet  90 mg Oral QODAY  . darbepoetin  60 mcg Intravenous Q Mon-HD  . heparin  20 Units/kg Dialysis Once in dialysis  . lanthanum  2,000 mg Oral TID WC  . lisinopril  20 mg Oral q morning - 10a  . lisinopril  40 mg Oral QHS  . multivitamin  1 tablet Oral QHS  . paricalcitol  2 mcg Intravenous 3 times weekly  . sodium chloride  3 mL Intravenous Q12H  . warfarin  12.5 mg Oral ONCE-1800  . Warfarin - Pharmacist Dosing Inpatient   Does not apply q1800  . DISCONTD: darbepoetin  60 mcg Intravenous Q7 days

## 2012-03-04 ENCOUNTER — Inpatient Hospital Stay (HOSPITAL_COMMUNITY): Payer: Medicare Other

## 2012-03-04 LAB — CBC
MCH: 26.9 pg (ref 26.0–34.0)
MCHC: 32.6 g/dL (ref 30.0–36.0)
MCV: 82.4 fL (ref 78.0–100.0)
Platelets: 164 10*3/uL (ref 150–400)
RDW: 15.1 % (ref 11.5–15.5)

## 2012-03-04 LAB — PROTIME-INR
INR: 2.13 — ABNORMAL HIGH (ref 0.00–1.49)
Prothrombin Time: 22.9 seconds — ABNORMAL HIGH (ref 11.6–15.2)

## 2012-03-04 LAB — RENAL FUNCTION PANEL
BUN: 55 mg/dL — ABNORMAL HIGH (ref 6–23)
CO2: 24 mEq/L (ref 19–32)
Calcium: 9.1 mg/dL (ref 8.4–10.5)
Glucose, Bld: 93 mg/dL (ref 70–99)
Phosphorus: 4.3 mg/dL (ref 2.3–4.6)

## 2012-03-04 LAB — HEPARIN LEVEL (UNFRACTIONATED): Heparin Unfractionated: >2 IU/mL — ABNORMAL HIGH (ref 0.30–0.70)

## 2012-03-04 MED ORDER — OXYCODONE HCL 5 MG PO TABS: 5.0000 mg | ORAL_TABLET | ORAL | Status: DC | PRN

## 2012-03-04 MED ORDER — WARFARIN SODIUM 5 MG PO TABS: 7.5000 mg | ORAL_TABLET | Freq: Every day | ORAL | Status: DC

## 2012-03-04 MED ORDER — PARICALCITOL 5 MCG/ML IV SOLN
INTRAVENOUS | Status: AC
  Administered 2012-03-04: 2 ug via INTRAVENOUS
  Filled 2012-03-04: qty 1

## 2012-03-04 MED ORDER — WARFARIN SODIUM 10 MG PO TABS
10.0000 mg | ORAL_TABLET | Freq: Once | ORAL | Status: AC
  Administered 2012-03-04: 10 mg via ORAL
  Filled 2012-03-04: qty 1

## 2012-03-04 NOTE — Progress Notes (Signed)
[facility] Progress Note  Subjective:   Objective Filed Vitals:   03/04/12 0640 03/04/12 0700 03/04/12 0730 03/04/12 0800  BP: 147/98 163/88 146/93 141/96  Pulse: 89 83 79 86  Temp:      TempSrc:      Resp:      Height:      Weight:      SpO2:       Physical Exam  Goal 6.5 General:sitting in bed NAD on HD Heart: RRR  Lungs:no wheezes or rales  Abdomen: obese soft  Extremities: no LE edema  Dialysis Access: left I-J Qb 400  OUTPATIENT HD: MWF, 4.5 hr, [facility], 126.5kg EDW. Zemplar 2ug, EPO 7800u, IV iron q Wed. Heparin 13000u then 2000 mid Rx. 2.5Ca/2.0K bath. L IJ TDC.  Assessment/Plan:  1. Pulmonary embolism w hemoptysis- Small hemoptysis 10/28 - no further episodes. IV hep/coumadin per pharmacy. No dyspnea or hypoxemia. Unable to do LE venous dopplers due to technical factors. INRs to be monitored after discharge at her dialysis center. INR therapeutic today.  No heparin HD. Pharmacy following. Neg cxr 10/28 ??Whether to resume heparin after discharge when therapeutic on coumadin. 2. ESRD, hd mwf. - Still needs permanent access - has clotted multiple accesses in the past. bilateral upper extremity venogram showed no evidence of central stenosis; will need permanent access when stable from PE. Hypercoagulable work - up mixed findings. ? More fore more evaluation after 6 months of coumadin. 3. HTN.vol- cont lisinopril 40pm/20am, amlodipine 10/hs as at home. Volume up Monday and today.  Goal set to 6.5 per pt request.  Pre HD wt was 133.5 so goal should be 7.5.  Plus she is not in street clothes so goal should likely be more.  Have increased to 7.5. 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 Last PTH 136. P - good control in hospital environment!!!! 5. Anemia of CKD- cont Aranesp 60, last tsat 41% with ferritin in 1600s - no IV Fe . Hgb stable in the 9s . 6. Morbid obesity/Nutrition - high protein renal diet and added  multivitamin 7. Disp - hopefully d/c today.  Would like pharmacy recommendations for d/c coumadin dosing.  Sheffield Slider, PA-C [facility] Beeper [phone]  03/04/2012,8:27 AM  LOS: 9 days   Additional Objective Labs: Lab Results  Component Value Date   INR 2.26* 03/04/2012   INR 1.68* 03/03/2012   INR 1.41 03/02/2012    Basic Metabolic Panel:  Lab 03/04/12 4540 03/02/12 0639 02/28/12 0930  NA 136 133* 138  K 4.1 4.3 4.0  CL 98 96 99  CO2 24 22 23   GLUCOSE 93 88 105*  BUN 55* 66* 50*  CREATININE 11.16* 12.14* 10.57*  CALCIUM 9.1 8.7 9.1  ALB -- -- --  PHOS 4.3 4.8* 6.9*   Liver Function Tests:  Lab 03/04/12 0649 03/02/12 0639 02/28/12 0930  AST -- -- --  ALT -- -- --  ALKPHOS -- -- --  BILITOT -- -- --  PROT -- -- --  ALBUMIN 3.0* 2.7* 2.9*   CBC:  Lab 03/04/12 0700 03/03/12 0500 03/02/12 0500 03/01/12 0625 02/29/12 0600  WBC 5.7 4.8 5.4 -- --  NEUTROABS -- -- -- -- --  HGB 9.3* 9.8* 9.1* -- --  HCT 28.5* 29.7* 27.7* -- --  MCV 82.4 83.2 82.7 84.2 85.0  PLT 164 150 149* -- --   Blood Culture    Component Value Date/Time  SDES WOUND ARM LEFT 07/04/2011 1739   SDES WOUND ARM LEFT 07/04/2011 1739   SDES WOUND ARM LEFT 07/04/2011 1739   SPECREQUEST VENOUS END OF THE ARTERIOVENOUS GORETEX GRAFT 07/04/2011 1739   SPECREQUEST VENOUS END OF THE ARTERIOVENOUS GORTEX GRAFT 07/04/2011 1739   SPECREQUEST VENOUS END OF THE ARTERIOVENOUS GORTEX GRAFT 07/04/2011 1739   CULT NO GROWTH 2 DAYS 07/04/2011 1739   CULT NO ANAEROBES ISOLATED 07/04/2011 1739   REPTSTATUS 07/07/2011 FINAL 07/04/2011 1739   REPTSTATUS 07/09/2011 FINAL 07/04/2011 1739   REPTSTATUS 07/04/2011 FINAL 07/04/2011 1739    Cardiac Enzymes: No results found for this basename: CKTOTAL:5,CKMB:5,CKMBINDEX:5,TROPONINI:5 in the last 168 hours CBG: No results found for this basename: GLUCAP:5 in the last 168 hours Iron Studies: No results found for this basename: IRON,TIBC,TRANSFERRIN,FERRITIN  in the last 72 hours @lablastinr3 @ Studies/Results: Dg Chest Port 1 View  03/02/2012  *RADIOLOGY REPORT*  Clinical Data: Status post hemodialysis, bleeding  PORTABLE CHEST - 1 VIEW  Comparison: 02/24/2012  Findings: The heart and pulmonary vascularity are within normal limits.  A left dialysis catheter is again noted.  The lungs are clear.  IMPRESSION: No acute abnormality noted.   Original Report Authenticated By: Phillips Odor, M.D.    Medications:    . heparin 1,750 Units/hr (03/03/12 2153)      . amLODipine  10 mg Oral QHS  . cinacalcet  90 mg Oral QODAY  . darbepoetin  60 mcg Intravenous Q Mon-HD  . heparin  20 Units/kg Dialysis Once in dialysis  . lanthanum  2,000 mg Oral TID WC  . lisinopril  20 mg Oral q morning - 10a  . lisinopril  40 mg Oral QHS  . multivitamin  1 tablet Oral QHS  . paricalcitol  2 mcg Intravenous 3 times weekly  . sodium chloride  3 mL Intravenous Q12H  . warfarin  12.5 mg Oral ONCE-1800  . Warfarin - Pharmacist Dosing Inpatient   Does not apply q1800  . DISCONTD: darbepoetin  60 mcg Intravenous Q7 days

## 2012-03-04 NOTE — Progress Notes (Signed)
ANTICOAGULATION CONSULT NOTE - Follow Up Consult  Pharmacy Consult for Heparin + Warfarin Indication: Suspected PE  Allergies  Allergen Reactions  . Zolpidem Tartrate Other (See Comments)    hallucinations    Patient Measurements: Height: 5\' 3"  (160 cm) Weight: 278 lb 3.5 oz (126.2 kg) IBW/kg (Calculated) : 52.4  Heparin Dosing Weight: 85.9 kg  Labs:  Basename 03/04/12 1606 03/04/12 0843 03/04/12 0700 03/04/12 0649 03/03/12 0500 03/02/12 0639 03/02/12 0500  HGB -- -- 9.3* -- 9.8* -- --  HCT -- -- 28.5* -- 29.7* -- 27.7*  PLT -- -- 164 -- 150 -- 149*  APTT -- -- -- -- -- -- --  LABPROT 22.9* -- 24.0* -- 19.2* -- --  INR 2.13* -- 2.26* -- 1.68* -- --  HEPARINUNFRC 0.43 0.72* >2.00* -- -- -- --  CREATININE -- -- -- 11.16* -- 12.14* --  CKTOTAL -- -- -- -- -- -- --  CKMB -- -- -- -- -- -- --  TROPONINI -- -- -- -- -- -- --    Estimated Creatinine Clearance: 9.1 ml/min (by C-G formula based on Cr of 11.16).   Medications:  Infusions:     . heparin 1,650 Units/hr (03/04/12 1412)    Assessment: 36 y.o. F on heparin + warfarin for suspected PE. INR and HL drawn with HD this a.m were noted to be falsely elevated. Repeat heparin level showed still SUPRAtherapeutic (0.72, goal of 0.3-0.5). INR will have to wait and be redrawn ~3-4 hours after HD to be accurate. Per discussion with Dr. Suanne Marker, if INR remains >2 with repeat level this afternoon -- will plan to discontinue heparin drip given the patient's high risk for bleeding. The patient has completed 5 days of minimum overlap recommended per CHEST guidelines. Hgb/Hct slight drop, Plts stable -- no further hemoptysis noted at this time.   Addendum 1700: Heparin level 0.43 and INR 2.13   Goal of Therapy:  INR 2-3 Heparin level 0.3-0.5 units/ml Monitor platelets by anticoagulation protocol: Yes   Plan:  1.d/c IV heparin with therapeutic INR 2. Recommend d/c home on Coumadin 10mg  po x 1 tonight then 7.5mg   daily.      03/04/2012 4:59 PM

## 2012-03-04 NOTE — Progress Notes (Signed)
ANTICOAGULATION CONSULT NOTE - Follow Up Consult  Pharmacy Consult for Heparin + Warfarin Indication: Suspected PE  Allergies  Allergen Reactions  . Zolpidem Tartrate Other (See Comments)    hallucinations    Patient Measurements: Height: 5\' 3"  (160 cm) Weight: 294 lb 12.1 oz (133.7 kg) IBW/kg (Calculated) : 52.4  Heparin Dosing Weight: 85.9 kg  Labs:  Basename 03/04/12 0843 03/04/12 0700 03/04/12 0649 03/03/12 0500 03/02/12 0639 03/02/12 0500  HGB -- 9.3* -- 9.8* -- --  HCT -- 28.5* -- 29.7* -- 27.7*  PLT -- 164 -- 150 -- 149*  APTT -- -- -- -- -- --  LABPROT -- 24.0* -- 19.2* -- 16.9*  INR -- 2.26* -- 1.68* -- 1.41  HEPARINUNFRC 0.72* >2.00* -- 0.39 -- --  CREATININE -- -- 11.16* -- 12.14* --  CKTOTAL -- -- -- -- -- --  CKMB -- -- -- -- -- --  TROPONINI -- -- -- -- -- --    Estimated Creatinine Clearance: 9.4 ml/min (by C-G formula based on Cr of 11.16).   Medications:  Infusions:     . heparin 1,750 Units/hr (03/03/12 2153)    Assessment: 36 y.o. F on heparin + warfarin for suspected PE. INR and HL drawn with HD this a.m were noted to be falsely elevated. Repeat heparin level showed still SUPRAtherapeutic (0.72, goal of 0.3-0.5). INR will have to wait and be redrawn ~3-4 hours after HD to be accurate. Per discussion with Dr. Suanne Marker, if INR remains >2 with repeat level this afternoon -- will plan to discontinue heparin drip given the patient's high risk for bleeding. The patient has completed 5 days of minimum overlap recommended per CHEST guidelines. Hgb/Hct slight drop, Plts stable -- no further hemoptysis noted at this time.   If repeat INR therapeutic this afternoon, noted plans for possible discharge home today.   Goal of Therapy:  INR 2-3 Heparin level 0.3-0.5 units/ml Monitor platelets by anticoagulation protocol: Yes   Plan:  1.Decrease heparin drip rate to 1650 units/hr (16.5 ml/hr)  2. Will dose warfarin today based on repeat INR result this  afternoon  3. Will continue to monitor for any signs/symptoms of bleeding and will follow up with heparin level and repeat PT/INR this afternoon  Georgina Pillion, PharmD, BCPS Clinical Pharmacist Pager: [phone] 03/04/2012 9:24 AM

## 2012-03-04 NOTE — Progress Notes (Signed)
I have seen and examined this patient and agree with plan as outlined by M. Doran Durand, PA-C.  We have not been able to get to EDW.  Hopefully we can d/c heparin gtt due to INR >2 and this will help.  Again discussed fluid restriction.  Discharge per primary svc. Ilyssa Grennan A,MD 03/04/2012 9:21 AM

## 2012-03-04 NOTE — Discharge Summary (Signed)
Physician Discharge Summary  Kristine Rios ZOX:[id number] DOB: 1975-10-14 DOA: 02/24/2012  PCP: Trevor Iha, MD  Admit date: 02/24/2012 Discharge date: 03/04/2012  Time spent: >81minutes  Recommendations for Outpatient Follow-up:    Follow-up Information    Follow up with DETERDING,JAMES L, MD. On 03/06/2012. (PT/INR  at dialysis on 11/1)    Contact information:   [address] [facility] [place] [address] [phone]       Follow up with Levert Feinstein, MD. (call for appt upon discharge)    Contact information:   [address] [place] [address] [phone]          Discharge Diagnoses:  Principal Problem:  *Acute pulmonary embolism Active Problems:  CKD (chronic kidney disease) stage V requiring chronic dialysis  Hyperkalemia  Systolic murmur  Morbid obesity with BMI of 50.0-59.9, adult   Discharge Condition: improved/stable  Diet recommendation: renal  Filed Weights   03/03/12 2222 03/04/12 0619 03/04/12 1113  Weight: 132.5 kg (292 lb 1.8 oz) 133.7 kg (294 lb 12.1 oz) 126.2 kg (278 lb 3.5 oz)    History of present illness:  The pt 36 year old female patient on chronic dialysis who presented to the emergency department after developing hemoptysis. She endorsed nonproductive cough for 2 days prior to this. No other significant symptoms such a shortness of breath, leg swelling, chest pain. She also was not taking any anticoagulant medications or utilizing any antiplatelet drugs. In the emergency department chest x-ray demonstrated mild edema. CT angiography of the chest was completed and this demonstrated high index of suspicion for left lower lobe pulmonary embolus. After she presented to the emergency department her hemoptysis spontaneously resolved but after the heparin drip was initiated in the emergency department her hemoptysis recurred. She was otherwise hemodynamically stable and her hemoglobin was stable. She was subsequently  admitted to the step down unit for further monitoring and treatment   Hospital Course by problem list:  *Acute pulmonary embolism with hemoptysis on admission  *Likely related to patient's obesity and suspected underlying venous insufficiency  *Patient has a history of multiple clotted dialysis access grafts and given her presentation with pulmonary embolism this is concerning for possible underlying hypercoagulable disorder.  Lupus anticoagulant is negative, beta 2 glycoprotein negative, cardiolipin Ig A weekly positive, factor V leiden negative, homocysteine elevated.  Patient likely will need formal hematological evaluation after complete 6 months of anticoagulation  *Lower extremity Doppler studies were completed but were inconclusive due to patient's body habitus  She had an initial episode of hemoptysis on 02/24/12 then a second one 03/02/12  - on Heparin iv since admission  Coumadin started on 10/24  - INR slowly trended up and is therapeutic today - pulm/ccm was consulted for recommendations and followed pt during her hospital stay -She is clinically improved, has not had any further hemoptysis and will be discharged for outpt follow up at this time. She will be having her INRs monitored per renal at dialysis -she has been referred to follow up with hematology-Dr Granfortuna outpt CKD (chronic kidney disease) stage V requiring chronic dialysis  - HD M/W/F, she is to followup outpt as directed  - venogram 10/24 - no central occlusion  Hyperkalemia  *resolved after treatment  Systolic murmur  *Patient states has had a murmur for some time  *Echocardiogram was ordered but patient refused  Morbid obesity with BMI of 50.0-59.9, adult  *Suspect primary contributing factor to patient's embolism     Consultants:  Nephrology  Pulmonary  Procedures:  Venogram    Discharge Exam: Filed Vitals:   03/04/12 1100 03/04/12 1113 03/04/12 1142 03/04/12 1400  BP: 135/102 139/88 137/92  146/89  Pulse: 94 97 90 93  Temp:  97.9 F (36.6 C) 98.8 F (37.1 C)   TempSrc:  Oral Oral   Resp:  18 18 18   Height:      Weight:  126.2 kg (278 lb 3.5 oz)    SpO2:  99% 94% 93%    Exam:  General: No acute respiratory distress, obese  Lungs: Clear to auscultation bilaterally without wheezes or crackles, on room air  Cardiovascular: Regular rate and rhythm without murmur gallop or rub normal S1 and S2  Abdomen: Nontender, nondistended, soft, bowel sounds positive, no rebound, no ascites, no appreciable mass   Discharge Instructions  Discharge Orders    Future Appointments: Provider: Department: Dept Phone: Center:   05/25/2012 11:15 AM Nada Libman, MD [facility] [phone] [facility]     Future Orders Please Complete By Expires   Diet renal [id number]      Increase activity slowly      Discharge instructions      Comments:   PT/INR on dialysis on 11/1-and further Coumadin as directed per M.D.       Medication List     As of 03/04/2012  6:45 PM    TAKE these medications         amLODipine 10 MG tablet   Commonly known as: NORVASC   Take 10 mg by mouth at bedtime.      cinacalcet 90 MG tablet   Commonly known as: SENSIPAR   Take 90 mg by mouth every other day.      lanthanum 1000 MG chewable tablet   Commonly known as: FOSRENOL   Chew 2,000 mg by mouth 3 (three) times daily with meals.      lisinopril 20 MG tablet   Commonly known as: PRINIVIL,ZESTRIL   Take 20-40 mg by mouth 2 (two) times daily. Take 1 tablet in the morning and 2 tablets in the evening      oxyCODONE 5 MG immediate release tablet   Commonly known as: Oxy IR/ROXICODONE   Take 1 tablet (5 mg total) by mouth every 4 (four) hours as needed.      warfarin 5 MG tablet   Commonly known as: COUMADIN   Take 1.5 tablets (7.5 mg total) by mouth daily.         Follow-up Information    Follow up with DETERDING,JAMES L, MD. On 03/06/2012. (PT/INR  at dialysis on 11/1)    Contact information:    [address] [facility] [place] [address] [phone]       Follow up with Levert Feinstein, MD. (call for appt upon discharge)    Contact information:   [address] [place] [address] [phone]           The results of significant diagnostics from this hospitalization (including imaging, microbiology, ancillary and laboratory) are listed below for reference.    Significant Diagnostic Studies: Ct Angio Chest W/cm &/or Wo Cm  02/24/2012  *RADIOLOGY REPORT*  Clinical Data: Hemoptysis, shortness of breath.  CT ANGIOGRAPHY CHEST  Technique:  Multidetector CT imaging of the chest using the standard protocol during bolus administration of intravenous contrast. Multiplanar reconstructed images including MIPs were obtained and reviewed to evaluate the vascular anatomy.  Contrast: 80mL OMNIPAQUE IOHEXOL 350 MG/ML SOLN  Comparison: Chest radiograph same day.  Findings: Examination  was performed with a 22 gauge IV. Opacification of the pulmonary arteries is suboptimal.  Together with respiratory motion, evaluation of the segmental and subsegmental pulmonary arteries is very limited.  There is probable low attenuation thrombus at the bifurcation of the left upper and left lower lobe pulmonary arteries (example image series 6, image 89).  Additional suspicious low attenuation thrombus is seen in the left lower lobe pulmonary artery, at the lobar level (series 6, image 110).  Probable thymic tissue in the prevascular space.  No pathologically enlarged mediastinal, hilar or axillary lymph nodes.  Pulmonary arteries are enlarged.  There does not appear to be straightening of the interventricular septum.  Heart is mildly enlarged. Dialysis catheter tips terminate in the right atrium.  No pericardial effusion.  Subpleural atelectasis and/or scarring in both lower lobes and lingula.  Mild added density throughout the lungs is most consistent with expiratory phase imaging.  No  pleural fluid. Airway is otherwise unremarkable.  Incidental imaging of the upper abdomen no worrisome lytic or sclerotic lesions.  IMPRESSION:  1.  Examination is limited by suboptimal opacification of the pulmonary arteries and respiratory motion.  However, there are suspected low attenuation filling defects in the distal left main and left lower lobe pulmonary arteries, highly worrisome for pulmonary emboli.  No evidence of right heart strain. 2.  Pulmonary arterial hypertension.   Original Report Authenticated By: Reyes Ivan, M.D.    Dg Chest Port 1 View  03/02/2012  *RADIOLOGY REPORT*  Clinical Data: Status post hemodialysis, bleeding  PORTABLE CHEST - 1 VIEW  Comparison: 02/24/2012  Findings: The heart and pulmonary vascularity are within normal limits.  A left dialysis catheter is again noted.  The lungs are clear.  IMPRESSION: No acute abnormality noted.   Original Report Authenticated By: Phillips Odor, M.D.    Dg Chest Portable 1 View  02/24/2012  *RADIOLOGY REPORT*  Clinical Data: Hemoptysis.  PORTABLE CHEST - 1 VIEW  Comparison: 07/06/2011.  Findings: The patient's head obscures the superior mediastinum and medial lung apices.  Left IJ dialysis catheter tips project over the SVC/RA junction and right atrium, respectively.  Heart is enlarged, stable.  Mild diffuse interstitial prominence and indistinctness.  IMPRESSION: Suspect mild edema.   Original Report Authenticated By: Reyes Ivan, M.D.     Microbiology: Recent Results (from the past 240 hour(s))  MRSA PCR SCREENING     Status: Normal   Collection Time   02/24/12  6:38 PM      Component Value Range Status Comment   MRSA by PCR NEGATIVE  NEGATIVE Final      Labs: Basic Metabolic Panel:  Lab 03/04/12 1610 03/02/12 0639 02/28/12 0930 02/27/12 0610  NA 136 133* 138 138  K 4.1 4.3 4.0 4.0  CL 98 96 99 98  CO2 24 22 23 23   GLUCOSE 93 88 105* 93  BUN 55* 66* 50* 29*  CREATININE 11.16* 12.14* 10.57* 7.04*    CALCIUM 9.1 8.7 9.1 8.8  MG -- -- -- --  PHOS 4.3 4.8* 6.9* --   Liver Function Tests:  Lab 03/04/12 0649 03/02/12 0639 02/28/12 0930  AST -- -- --  ALT -- -- --  ALKPHOS -- -- --  BILITOT -- -- --  PROT -- -- --  ALBUMIN 3.0* 2.7* 2.9*   No results found for this basename: LIPASE:5,AMYLASE:5 in the last 168 hours No results found for this basename: AMMONIA:5 in the last 168 hours CBC:  Lab 03/04/12 0700 03/03/12  0500 03/02/12 0500 03/01/12 0625 02/29/12 0600  WBC 5.7 4.8 5.4 5.7 6.2  NEUTROABS -- -- -- -- --  HGB 9.3* 9.8* 9.1* 9.4* 10.1*  HCT 28.5* 29.7* 27.7* 27.8* 30.1*  MCV 82.4 83.2 82.7 84.2 85.0  PLT 164 150 149* 139* 153   Cardiac Enzymes: No results found for this basename: CKTOTAL:5,CKMB:5,CKMBINDEX:5,TROPONINI:5 in the last 168 hours BNP: BNP (last 3 results) No results found for this basename: PROBNP:3 in the last 8760 hours CBG: No results found for this basename: GLUCAP:5 in the last 168 hours     Signed:  Kela Millin  [facility] 03/04/2012, 6:45 PM

## 2012-03-05 ENCOUNTER — Telehealth: Payer: Self-pay | Admitting: Surgery

## 2012-03-05 DIAGNOSIS — N186 End stage renal disease: Secondary | ICD-10-CM | POA: Diagnosis not present

## 2012-03-05 NOTE — Telephone Encounter (Addendum)
Message copied by Rosalyn Charters on Thu Mar 05, 2012 10:57 AM ------      Message from: Lamar Blinks S      Created: Thu Mar 05, 2012 10:26 AM      Regarding: 1 mo f/u w/ [facility]                   ----- Message -----         From: Nada Libman, MD         Sent: 03/04/2012   7:32 PM           To: Melene Plan, RN, Conley Simmonds Pullins, RN            Please schedule her to come and see me for dialysis access planning in 1 month  notified patient of fu appt. with dr. Myra Gianotti on 04-06-12 2:15 as per staff message 03-05-12

## 2012-03-06 DIAGNOSIS — N186 End stage renal disease: Secondary | ICD-10-CM | POA: Diagnosis not present

## 2012-03-06 DIAGNOSIS — N039 Chronic nephritic syndrome with unspecified morphologic changes: Secondary | ICD-10-CM | POA: Diagnosis not present

## 2012-03-06 DIAGNOSIS — I2699 Other pulmonary embolism without acute cor pulmonale: Secondary | ICD-10-CM | POA: Diagnosis not present

## 2012-03-06 DIAGNOSIS — D509 Iron deficiency anemia, unspecified: Secondary | ICD-10-CM | POA: Diagnosis not present

## 2012-03-06 DIAGNOSIS — D631 Anemia in chronic kidney disease: Secondary | ICD-10-CM | POA: Diagnosis not present

## 2012-03-06 DIAGNOSIS — Z111 Encounter for screening for respiratory tuberculosis: Secondary | ICD-10-CM | POA: Diagnosis not present

## 2012-03-06 DIAGNOSIS — N2581 Secondary hyperparathyroidism of renal origin: Secondary | ICD-10-CM | POA: Diagnosis not present

## 2012-03-09 DIAGNOSIS — Z111 Encounter for screening for respiratory tuberculosis: Secondary | ICD-10-CM | POA: Diagnosis not present

## 2012-03-09 DIAGNOSIS — N2581 Secondary hyperparathyroidism of renal origin: Secondary | ICD-10-CM | POA: Diagnosis not present

## 2012-03-09 DIAGNOSIS — D509 Iron deficiency anemia, unspecified: Secondary | ICD-10-CM | POA: Diagnosis not present

## 2012-03-09 DIAGNOSIS — D631 Anemia in chronic kidney disease: Secondary | ICD-10-CM | POA: Diagnosis not present

## 2012-03-09 DIAGNOSIS — N186 End stage renal disease: Secondary | ICD-10-CM | POA: Diagnosis not present

## 2012-03-11 DIAGNOSIS — Z111 Encounter for screening for respiratory tuberculosis: Secondary | ICD-10-CM | POA: Diagnosis not present

## 2012-03-11 DIAGNOSIS — I2699 Other pulmonary embolism without acute cor pulmonale: Secondary | ICD-10-CM | POA: Diagnosis not present

## 2012-03-11 DIAGNOSIS — N186 End stage renal disease: Secondary | ICD-10-CM | POA: Diagnosis not present

## 2012-03-11 DIAGNOSIS — D509 Iron deficiency anemia, unspecified: Secondary | ICD-10-CM | POA: Diagnosis not present

## 2012-03-11 DIAGNOSIS — D631 Anemia in chronic kidney disease: Secondary | ICD-10-CM | POA: Diagnosis not present

## 2012-03-11 DIAGNOSIS — N2581 Secondary hyperparathyroidism of renal origin: Secondary | ICD-10-CM | POA: Diagnosis not present

## 2012-03-13 DIAGNOSIS — N2581 Secondary hyperparathyroidism of renal origin: Secondary | ICD-10-CM | POA: Diagnosis not present

## 2012-03-13 DIAGNOSIS — D631 Anemia in chronic kidney disease: Secondary | ICD-10-CM | POA: Diagnosis not present

## 2012-03-13 DIAGNOSIS — N186 End stage renal disease: Secondary | ICD-10-CM | POA: Diagnosis not present

## 2012-03-13 DIAGNOSIS — Z111 Encounter for screening for respiratory tuberculosis: Secondary | ICD-10-CM | POA: Diagnosis not present

## 2012-03-13 DIAGNOSIS — D509 Iron deficiency anemia, unspecified: Secondary | ICD-10-CM | POA: Diagnosis not present

## 2012-03-16 DIAGNOSIS — N2581 Secondary hyperparathyroidism of renal origin: Secondary | ICD-10-CM | POA: Diagnosis not present

## 2012-03-16 DIAGNOSIS — D631 Anemia in chronic kidney disease: Secondary | ICD-10-CM | POA: Diagnosis not present

## 2012-03-16 DIAGNOSIS — D509 Iron deficiency anemia, unspecified: Secondary | ICD-10-CM | POA: Diagnosis not present

## 2012-03-16 DIAGNOSIS — N186 End stage renal disease: Secondary | ICD-10-CM | POA: Diagnosis not present

## 2012-03-16 DIAGNOSIS — Z111 Encounter for screening for respiratory tuberculosis: Secondary | ICD-10-CM | POA: Diagnosis not present

## 2012-03-18 DIAGNOSIS — N039 Chronic nephritic syndrome with unspecified morphologic changes: Secondary | ICD-10-CM | POA: Diagnosis not present

## 2012-03-18 DIAGNOSIS — Z111 Encounter for screening for respiratory tuberculosis: Secondary | ICD-10-CM | POA: Diagnosis not present

## 2012-03-18 DIAGNOSIS — I2699 Other pulmonary embolism without acute cor pulmonale: Secondary | ICD-10-CM | POA: Diagnosis not present

## 2012-03-18 DIAGNOSIS — N186 End stage renal disease: Secondary | ICD-10-CM | POA: Diagnosis not present

## 2012-03-18 DIAGNOSIS — N2581 Secondary hyperparathyroidism of renal origin: Secondary | ICD-10-CM | POA: Diagnosis not present

## 2012-03-18 DIAGNOSIS — D509 Iron deficiency anemia, unspecified: Secondary | ICD-10-CM | POA: Diagnosis not present

## 2012-03-20 ENCOUNTER — Emergency Department (HOSPITAL_COMMUNITY): Payer: Medicare Other

## 2012-03-20 ENCOUNTER — Inpatient Hospital Stay (HOSPITAL_COMMUNITY)
Admission: EM | Admit: 2012-03-20 | Discharge: 2012-03-23 | DRG: 175 | Disposition: A | Discharge status: Home / Self Care | Payer: Medicare Other | Attending: Internal Medicine | Admitting: Internal Medicine

## 2012-03-20 DIAGNOSIS — Z992 Dependence on renal dialysis: Secondary | ICD-10-CM | POA: Diagnosis not present

## 2012-03-20 DIAGNOSIS — I120 Hypertensive chronic kidney disease with stage 5 chronic kidney disease or end stage renal disease: Secondary | ICD-10-CM | POA: Diagnosis not present

## 2012-03-20 DIAGNOSIS — N2581 Secondary hyperparathyroidism of renal origin: Secondary | ICD-10-CM | POA: Diagnosis present

## 2012-03-20 DIAGNOSIS — Z7901 Long term (current) use of anticoagulants: Secondary | ICD-10-CM

## 2012-03-20 DIAGNOSIS — N186 End stage renal disease: Secondary | ICD-10-CM | POA: Diagnosis not present

## 2012-03-20 DIAGNOSIS — E875 Hyperkalemia: Secondary | ICD-10-CM | POA: Diagnosis present

## 2012-03-20 DIAGNOSIS — R042 Hemoptysis: Secondary | ICD-10-CM | POA: Diagnosis not present

## 2012-03-20 DIAGNOSIS — E669 Obesity, unspecified: Secondary | ICD-10-CM | POA: Diagnosis present

## 2012-03-20 DIAGNOSIS — I2782 Chronic pulmonary embolism: Secondary | ICD-10-CM | POA: Diagnosis not present

## 2012-03-20 DIAGNOSIS — I2699 Other pulmonary embolism without acute cor pulmonale: Secondary | ICD-10-CM | POA: Diagnosis present

## 2012-03-20 DIAGNOSIS — N039 Chronic nephritic syndrome with unspecified morphologic changes: Secondary | ICD-10-CM | POA: Diagnosis not present

## 2012-03-20 DIAGNOSIS — D649 Anemia, unspecified: Secondary | ICD-10-CM | POA: Diagnosis present

## 2012-03-20 DIAGNOSIS — Z6841 Body Mass Index (BMI) 40.0 and over, adult: Secondary | ICD-10-CM | POA: Diagnosis not present

## 2012-03-20 DIAGNOSIS — S68118A Complete traumatic metacarpophalangeal amputation of other finger, initial encounter: Secondary | ICD-10-CM | POA: Diagnosis not present

## 2012-03-20 DIAGNOSIS — D631 Anemia in chronic kidney disease: Secondary | ICD-10-CM | POA: Diagnosis not present

## 2012-03-20 DIAGNOSIS — Z79899 Other long term (current) drug therapy: Secondary | ICD-10-CM

## 2012-03-20 DIAGNOSIS — R011 Cardiac murmur, unspecified: Secondary | ICD-10-CM

## 2012-03-20 DIAGNOSIS — R791 Abnormal coagulation profile: Secondary | ICD-10-CM

## 2012-03-20 DIAGNOSIS — N185 Chronic kidney disease, stage 5: Secondary | ICD-10-CM | POA: Diagnosis not present

## 2012-03-20 LAB — PROTIME-INR: Prothrombin Time: 36 seconds — ABNORMAL HIGH (ref 11.6–15.2)

## 2012-03-20 LAB — RENAL FUNCTION PANEL
BUN: 66 mg/dL — ABNORMAL HIGH (ref 6–23)
Chloride: 99 mEq/L (ref 96–112)
Creatinine, Ser: 11.89 mg/dL — ABNORMAL HIGH (ref 0.50–1.10)
Glucose, Bld: 98 mg/dL (ref 70–99)
Potassium: 6.4 mEq/L (ref 3.5–5.1)

## 2012-03-20 LAB — CBC WITH DIFFERENTIAL/PLATELET
Basophils Absolute: 0 10*3/uL (ref 0.0–0.1)
Basophils Relative: 0 % (ref 0–1)
Eosinophils Relative: 1 % (ref 0–5)
HCT: 33.3 % — ABNORMAL LOW (ref 36.0–46.0)
Lymphocytes Relative: 23 % (ref 12–46)
MCHC: 33 g/dL (ref 30.0–36.0)
MCV: 83.7 fL (ref 78.0–100.0)
Monocytes Absolute: 0.4 10*3/uL (ref 0.1–1.0)
Neutro Abs: 4.5 10*3/uL (ref 1.7–7.7)
Platelets: 196 10*3/uL (ref 150–400)
RDW: 15.8 % — ABNORMAL HIGH (ref 11.5–15.5)
WBC: 6.5 10*3/uL (ref 4.0–10.5)

## 2012-03-20 LAB — BASIC METABOLIC PANEL
CO2: 21 mEq/L (ref 19–32)
Calcium: 8.3 mg/dL — ABNORMAL LOW (ref 8.4–10.5)
Creatinine, Ser: 11.23 mg/dL — ABNORMAL HIGH (ref 0.50–1.10)
GFR calc Af Amer: 4 mL/min — ABNORMAL LOW (ref >90)
GFR calc non Af Amer: 4 mL/min — ABNORMAL LOW (ref >90)
Sodium: 138 mEq/L (ref 135–145)

## 2012-03-20 LAB — CBC
HCT: 30.7 % — ABNORMAL LOW (ref 36.0–46.0)
Hemoglobin: 10.2 g/dL — ABNORMAL LOW (ref 12.0–15.0)
MCHC: 33.2 g/dL (ref 30.0–36.0)
MCV: 83.2 fL (ref 78.0–100.0)
WBC: 7.1 10*3/uL (ref 4.0–10.5)

## 2012-03-20 LAB — APTT: aPTT: 64 seconds — ABNORMAL HIGH (ref 24–37)

## 2012-03-20 MED ORDER — SODIUM CHLORIDE 0.9 % IJ SOLN
3.0000 mL | Freq: Two times a day (BID) | INTRAMUSCULAR | Status: DC
  Administered 2012-03-22 (×2): 3 mL via INTRAVENOUS

## 2012-03-20 MED ORDER — CINACALCET HCL 30 MG PO TABS
90.0000 mg | ORAL_TABLET | ORAL | Status: DC
  Administered 2012-03-21 – 2012-03-23 (×2): 90 mg via ORAL
  Filled 2012-03-20 (×2): qty 3

## 2012-03-20 MED ORDER — LISINOPRIL 20 MG PO TABS
20.0000 mg | ORAL_TABLET | Freq: Every day | ORAL | Status: DC
  Administered 2012-03-21 – 2012-03-22 (×2): 20 mg via ORAL
  Filled 2012-03-20 (×3): qty 1

## 2012-03-20 MED ORDER — ACETAMINOPHEN 325 MG PO TABS: 650.0000 mg | ORAL_TABLET | Freq: Four times a day (QID) | ORAL | Status: DC | PRN

## 2012-03-20 MED ORDER — AMLODIPINE BESYLATE 10 MG PO TABS
10.0000 mg | ORAL_TABLET | Freq: Every day | ORAL | Status: DC
Start: 2012-03-20 — End: 2012-03-23
  Administered 2012-03-20 – 2012-03-22 (×3): 10 mg via ORAL
  Filled 2012-03-20 (×4): qty 1

## 2012-03-20 MED ORDER — DARBEPOETIN ALFA-POLYSORBATE 60 MCG/0.3ML IJ SOLN: 60.0000 ug | INTRAMUSCULAR | Status: DC

## 2012-03-20 MED ORDER — ONDANSETRON HCL 4 MG PO TABS: 4.0000 mg | ORAL_TABLET | Freq: Four times a day (QID) | ORAL | Status: DC | PRN

## 2012-03-20 MED ORDER — SODIUM CHLORIDE 0.9 % IV SOLN: 250.0000 mL | INTRAVENOUS | Status: DC | PRN

## 2012-03-20 MED ORDER — ACETAMINOPHEN 650 MG RE SUPP: 650.0000 mg | Freq: Four times a day (QID) | RECTAL | Status: DC | PRN

## 2012-03-20 MED ORDER — LISINOPRIL 40 MG PO TABS
40.0000 mg | ORAL_TABLET | Freq: Every day | ORAL | Status: DC
  Administered 2012-03-20: 40 mg via ORAL
  Filled 2012-03-20 (×4): qty 1

## 2012-03-20 MED ORDER — HEPARIN SOD (PORK) LOCK FLUSH 100 UNIT/ML IV SOLN
4600.0000 [IU] | Freq: Once | INTRAVENOUS | Status: AC
  Administered 2012-03-20: 4600 [IU] via INTRAVENOUS
  Filled 2012-03-20: qty 50

## 2012-03-20 MED ORDER — SODIUM CHLORIDE 0.9 % IJ SOLN
3.0000 mL | Freq: Two times a day (BID) | INTRAMUSCULAR | Status: DC
  Administered 2012-03-20 – 2012-03-21 (×2): 3 mL via INTRAVENOUS

## 2012-03-20 MED ORDER — CALCIUM ACETATE 667 MG PO CAPS
2001.0000 mg | ORAL_CAPSULE | Freq: Three times a day (TID) | ORAL | Status: DC
  Filled 2012-03-20 (×5): qty 3

## 2012-03-20 MED ORDER — IOHEXOL 350 MG/ML SOLN
100.0000 mL | Freq: Once | INTRAVENOUS | Status: AC | PRN
Start: 2012-03-20 — End: 2012-03-20
  Administered 2012-03-20: 100 mL via INTRAVENOUS

## 2012-03-20 MED ORDER — SODIUM CHLORIDE 0.9 % IJ SOLN: 3.0000 mL | INTRAMUSCULAR | Status: DC | PRN

## 2012-03-20 MED ORDER — BISACODYL 10 MG RE SUPP: 10.0000 mg | Freq: Every day | RECTAL | Status: DC | PRN

## 2012-03-20 MED ORDER — LISINOPRIL 20 MG PO TABS: 20.0000 mg | ORAL_TABLET | Freq: Two times a day (BID) | ORAL | Status: DC

## 2012-03-20 MED ORDER — RENA-VITE PO TABS
1.0000 | ORAL_TABLET | Freq: Every day | ORAL | Status: DC
Start: 2012-03-20 — End: 2012-03-23
  Administered 2012-03-20 – 2012-03-23 (×4): 1 via ORAL
  Filled 2012-03-20 (×4): qty 1

## 2012-03-20 MED ORDER — ONDANSETRON HCL 4 MG/2ML IJ SOLN: 4.0000 mg | Freq: Four times a day (QID) | INTRAMUSCULAR | Status: DC | PRN

## 2012-03-20 MED ORDER — HEPARIN SOD (PORK) LOCK FLUSH 100 UNIT/ML IV SOLN
500.0000 [IU] | Freq: Once | INTRAVENOUS | Status: DC
  Filled 2012-03-20: qty 5

## 2012-03-20 MED ORDER — OXYCODONE HCL 5 MG PO TABS: 5.0000 mg | ORAL_TABLET | ORAL | Status: DC | PRN

## 2012-03-20 NOTE — ED Notes (Signed)
Pt given a warm blanket 
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
## 2012-03-20 NOTE — Progress Notes (Signed)
36 yo woman with known pulmonary embolism, on coumadin, who had hemptysis today.  Her INR was 3.91.  CT angio of chest showed her old PE on the left as well as a PE in the right lung, indeterminate age.  Call to Dr. Donna Bernard to admit her, and to Dr. Arrie Aran regarding the fact that she has ESRD and needs dialysis.

## 2012-03-20 NOTE — ED Notes (Signed)
Rn attempted IV X2 without success. IV team paged.

## 2012-03-20 NOTE — ED Notes (Signed)
Admitting at bedside 

## 2012-03-20 NOTE — Progress Notes (Signed)
CRITICAL VALUE ALERT  Critical value received:Serum Potassium at 6.4, note: sample showed no hemolysis present   Date of notification:  03/20/2012  Time of notification:  1545  Critical value read back:yes,   Nurse who received alert:  Audie Pinto  MD notified (1st page):  Dr. Suanne Marker  Time of first page:  16:03  MD notified (2nd page):  Time of second page:  Responding MD:  Dr. Suanne Marker  Time MD responded:  16:05. No orders given by Dr.Viyuoh at this time.
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
## 2012-03-20 NOTE — ED Provider Notes (Signed)
Medical screening examination/treatment/procedure(s) were conducted as a shared visit with non-physician practitioner(s) and myself.  I personally evaluated the patient during the encounter 36 yo woman with known pulmonary embolism, on coumadin, who had hemptysis today. Her INR was 3.91. CT angio of chest showed her old PE on the left as well as a PE in the right lung, indeterminate age. Call to Dr. Donna Bernard to admit her, and to Dr. Arrie Aran regarding the fact that she has ESRD and needs dialysis.        Carleene Cooper III, MD 03/20/12 2009

## 2012-03-21 ENCOUNTER — Inpatient Hospital Stay (HOSPITAL_COMMUNITY): Payer: Medicare Other

## 2012-03-21 ENCOUNTER — Encounter (HOSPITAL_COMMUNITY): Payer: Self-pay

## 2012-03-21 DIAGNOSIS — I2699 Other pulmonary embolism without acute cor pulmonale: Secondary | ICD-10-CM

## 2012-03-21 DIAGNOSIS — R042 Hemoptysis: Secondary | ICD-10-CM

## 2012-03-21 DIAGNOSIS — R791 Abnormal coagulation profile: Secondary | ICD-10-CM

## 2012-03-21 DIAGNOSIS — N186 End stage renal disease: Secondary | ICD-10-CM

## 2012-03-21 DIAGNOSIS — Z6841 Body Mass Index (BMI) 40.0 and over, adult: Secondary | ICD-10-CM

## 2012-03-21 LAB — CBC
HCT: 30.6 % — ABNORMAL LOW (ref 36.0–46.0)
Hemoglobin: 10.2 g/dL — ABNORMAL LOW (ref 12.0–15.0)
MCH: 28.1 pg (ref 26.0–34.0)
MCV: 84.3 fL (ref 78.0–100.0)
RBC: 3.63 MIL/uL — ABNORMAL LOW (ref 3.87–5.11)

## 2012-03-21 LAB — BASIC METABOLIC PANEL
BUN: 24 mg/dL — ABNORMAL HIGH (ref 6–23)
CO2: 25 mEq/L (ref 19–32)
Glucose, Bld: 96 mg/dL (ref 70–99)
Potassium: 4.3 mEq/L (ref 3.5–5.1)
Sodium: 137 mEq/L (ref 135–145)

## 2012-03-21 MED ORDER — CALCIUM ACETATE (PHOS BINDER) 667 MG/5ML PO SOLN
2007.0000 mg | Freq: Three times a day (TID) | ORAL | Status: DC
  Filled 2012-03-21 (×3): qty 15

## 2012-03-21 MED ORDER — CALCIUM ACETATE (PHOS BINDER) 667 MG/5ML PO SOLN
2001.0000 mg | Freq: Three times a day (TID) | ORAL | Status: DC
  Administered 2012-03-21 – 2012-03-23 (×6): 2001 mg via ORAL
  Filled 2012-03-21 (×9): qty 15
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
## 2012-03-21 NOTE — Progress Notes (Signed)
[facility] PROGRESS NOTE  MAMTA AUGUST UJW:[id number] DOB: 12-18-1975 DOA: 03/20/2012 PCP: Trevor Iha, MD  Assessment/Plan: Hemoptysis -Hemoglobin has remained stable, remained hemodynamically stable -No hemoptysis since hospitalization -Supratherapeutic INR -Continue to hold Coumadin -?? Role of DDAVP if hemoglobin drops -Continue to monitor closely -Unclear if any other underlying pulmonary pathology--infarct versus vasculitis -Appreciate pulmonary followup Pulmonary embolus -CT angiogram reveals subacute to chronic pulmonary emboli -Hold off IVC filter for now--02/25/2012 lower extremity Dopplers indeterminate due to body habitus ESRD -Appreciate nephrology followup Hypertension -Well controlled -Continue current regimen    Disposition Plan:   Home when medically stable      Procedures/Studies: Dg Chest 2 View  03/21/2012  *RADIOLOGY REPORT*  Clinical Data: Hemoptysis.  History of pulmonary embolism.  CHEST - 2 VIEW  Comparison: Chest CT 03/20/2012 and chest radiograph 03/02/2012  Findings: Two views of the chest demonstrate a left jugular dialysis catheter.  Catheter tip in the right atrium.  There are subtle densities at the right costophrenic angle suggestive for scarring or atelectasis based on the previous CT findings.  There is no focal airspace disease or edema.  Heart size is within normal limits.  No definite pleural effusions.  IMPRESSION: Densities at the right costophrenic angle are most likely associated with scarring or atelectasis.  No focal airspace disease.   Original Report Authenticated By: Richarda Overlie, M.D.    Ct Angio Chest Pe W/cm &/or Wo Cm  03/20/2012  *RADIOLOGY REPORT*  Clinical Data: Coughing and hemoptysis.  CT ANGIOGRAPHY CHEST  Technique:  Multidetector CT imaging of the chest using the standard protocol during bolus administration of intravenous contrast. Multiplanar reconstructed images including MIPs were obtained and reviewed to  evaluate the vascular anatomy.  Contrast: OMNIPAQUE IOHEXOL 350 MG/ML SOLN  Comparison: Chest CT 02/24/2012  Findings: Again noted is an abnormal appearance of the left lower lobe pulmonary arteries.  There are low density filling defect in this area and the distal branches are small and likely occluded. In addition, there are filling defects involving the right lower lobe segmental branches best seen on sequence #18, 162.  This area was poorly imaged on the prior examination and difficult to know if this is new or chronic.  Calcifications in this region suggest that there could be a chronic component.  The main pulmonary arteries are patent.  The patient has a dialysis catheter and the tip extends into the right atrium.  Imaging of the upper abdomen demonstrates atrophic kidneys consistent a history of end-stage renal failure.  There is no significant pericardial or pleural fluid.  No significant lymphadenopathy.  There is soft tissue in the anterior mediastinum which likely represents thymus.  Small lymph nodes in the supraclavicular region are nonspecific.  The trachea and mainstem bronchi are patent.  There is chronic scarring along the pleural surface of the lateral right lower lobe. Chronic scarring and volume loss in the left lower lobe.  No acute bony abnormality.  IMPRESSION: Study is positive for pulmonary emboli.  The pulmonary emboli involving the left lower lobe pulmonary appear chronic since at least 02/24/2012.  The emboli within the right lower lobe branches are indeterminate for age due to the limitations from the prior examination.  However, these clots could be old as well.  There are chronic areas of scarring in the lower lobes bilaterally. Some these findings could be related to areas of old infarction.  Atrophic kidneys consistent with end-stage renal disease.  These results were called by telephone on 03/20/2012  at 12:55 p.m. to Dr. Ignacia Palma, who verbally acknowledged these results.    Original Report Authenticated By: Richarda Overlie, M.D.    Ct Angio Chest W/cm &/or Wo Cm  02/24/2012  *RADIOLOGY REPORT*  Clinical Data: Hemoptysis, shortness of breath.  CT ANGIOGRAPHY CHEST  Technique:  Multidetector CT imaging of the chest using the standard protocol during bolus administration of intravenous contrast. Multiplanar reconstructed images including MIPs were obtained and reviewed to evaluate the vascular anatomy.  Contrast: 80mL OMNIPAQUE IOHEXOL 350 MG/ML SOLN  Comparison: Chest radiograph same day.  Findings: Examination was performed with a 22 gauge IV. Opacification of the pulmonary arteries is suboptimal.  Together with respiratory motion, evaluation of the segmental and subsegmental pulmonary arteries is very limited.  There is probable low attenuation thrombus at the bifurcation of the left upper and left lower lobe pulmonary arteries (example image series 6, image 89).  Additional suspicious low attenuation thrombus is seen in the left lower lobe pulmonary artery, at the lobar level (series 6, image 110).  Probable thymic tissue in the prevascular space.  No pathologically enlarged mediastinal, hilar or axillary lymph nodes.  Pulmonary arteries are enlarged.  There does not appear to be straightening of the interventricular septum.  Heart is mildly enlarged. Dialysis catheter tips terminate in the right atrium.  No pericardial effusion.  Subpleural atelectasis and/or scarring in both lower lobes and lingula.  Mild added density throughout the lungs is most consistent with expiratory phase imaging.  No pleural fluid. Airway is otherwise unremarkable.  Incidental imaging of the upper abdomen no worrisome lytic or sclerotic lesions.  IMPRESSION:  1.  Examination is limited by suboptimal opacification of the pulmonary arteries and respiratory motion.  However, there are suspected low attenuation filling defects in the distal left main and left lower lobe pulmonary arteries, highly worrisome for  pulmonary emboli.  No evidence of right heart strain. 2.  Pulmonary arterial hypertension.   Original Report Authenticated By: Reyes Ivan, M.D.    Dg Chest Port 1 View  03/02/2012  *RADIOLOGY REPORT*  Clinical Data: Status post hemodialysis, bleeding  PORTABLE CHEST - 1 VIEW  Comparison: 02/24/2012  Findings: The heart and pulmonary vascularity are within normal limits.  A left dialysis catheter is again noted.  The lungs are clear.  IMPRESSION: No acute abnormality noted.   Original Report Authenticated By: Phillips Odor, M.D.    Dg Chest Portable 1 View  02/24/2012  *RADIOLOGY REPORT*  Clinical Data: Hemoptysis.  PORTABLE CHEST - 1 VIEW  Comparison: 07/06/2011.  Findings: The patient's head obscures the superior mediastinum and medial lung apices.  Left IJ dialysis catheter tips project over the SVC/RA junction and right atrium, respectively.  Heart is enlarged, stable.  Mild diffuse interstitial prominence and indistinctness.  IMPRESSION: Suspect mild edema.   Original Report Authenticated By: Reyes Ivan, M.D.          Subjective: Patient is doing well. She denies any fevers, chills, chest pain, shortness of breath, nausea, vomiting, diarrhea, dizziness, headache, syncope. Denies any rashes.  Objective: Filed Vitals:   03/20/12 2023 03/20/12 2039 03/21/12 0533 03/21/12 0910  BP:  107/73 121/73 101/71  Pulse:  98 54 94  Temp:  98.3 F (36.8 C) 98.2 F (36.8 C) 98 F (36.7 C)  TempSrc:   Oral Oral  Resp:  18 18 18   Height: 5\' 3"  (1.6 m)     Weight:  128.5 kg (283 lb 4.7 oz)    SpO2:  97%  95% 98%    Intake/Output Summary (Last 24 hours) at 03/21/12 1050 Last data filed at 03/21/12 0830  Gross per 24 hour  Intake      0 ml  Output   3536 ml  Net  -3536 ml   Weight change:  Exam:   General:  Pt is alert, follows commands appropriately, not in acute distress  HEENT: No icterus, No thrush,[place]/AT  Cardiovascular: RRR, S1/S2, no rubs, no  gallops  Respiratory: CTA bilaterally, no wheezing, no crackles, no rhonchi  Abdomen: Soft/+BS, non tender, non distended, no guarding  Extremities: trace edema, No lymphangitis, No petechiae, No rashes, no synovitis  Data Reviewed: Basic Metabolic Panel:  Lab 03/21/12 2130 03/20/12 1454 03/20/12 1051  NA 137 137 138  K 4.3 6.4* 6.2*  CL 98 99 99  CO2 25 20 21   GLUCOSE 96 98 84  BUN 24* 66* 63*  CREATININE 6.04* 11.89* 11.23*  CALCIUM 8.4 7.8* 8.3*  MG -- -- --  PHOS -- 7.2* --   Liver Function Tests:  Lab 03/20/12 1454  AST --  ALT --  ALKPHOS --  BILITOT --  PROT --  ALBUMIN 3.0*   No results found for this basename: LIPASE:5,AMYLASE:5 in the last 168 hours No results found for this basename: AMMONIA:5 in the last 168 hours CBC:  Lab 03/21/12 0400 03/20/12 1454 03/20/12 1051  WBC 6.0 7.1 6.5  NEUTROABS -- -- 4.5  HGB 10.2* 10.2* 11.0*  HCT 30.6* 30.7* 33.3*  MCV 84.3 83.2 83.7  PLT 187 195 196   Cardiac Enzymes: No results found for this basename: CKTOTAL:5,CKMB:5,CKMBINDEX:5,TROPONINI:5 in the last 168 hours BNP: No components found with this basename: POCBNP:5 CBG: No results found for this basename: GLUCAP:5 in the last 168 hours  No results found for this or any previous visit (from the past 240 hour(s)).   Scheduled Meds:   . amLODipine  10 mg Oral QHS  . calcium acetate  2,001 mg Oral TID WC  . cinacalcet  90 mg Oral QODAY  . darbepoetin (ARANESP) injection - DIALYSIS  60 mcg Intravenous Q Wed-HD  . [COMPLETED] heparin lock flush  4,600 Units Intravenous Once  . heparin lock flush  500 Units Intravenous Once  . lisinopril  20 mg Oral Daily  . lisinopril  40 mg Oral QHS  . multivitamin  1 tablet Oral Daily  . sodium chloride  3 mL Intravenous Q12H  . sodium chloride  3 mL Intravenous Q12H  . [DISCONTINUED] lisinopril  20-40 mg Oral BID   Continuous Infusions:    Irish Breisch, DO  [facility] Pager [phone]  If 7PM-7AM, please  contact night-coverage [email or web address] Password [email or web address] 03/21/2012, 10:50 AM   LOS: 1 day

## 2012-03-21 NOTE — Progress Notes (Signed)
Pt transferred form Hemodialysis from admission through ED. Pt comes from home and is AOx3. Ambulatory. Pt has no skin issues. Pt oriented to unit, staff, policies, and safety measures. Call bell light with in reach and pt told to press call bell light when needing assistance prior to getting ob for increased safety.

## 2012-03-22 LAB — CBC
HCT: 30.1 % — ABNORMAL LOW (ref 36.0–46.0)
Hemoglobin: 10.1 g/dL — ABNORMAL LOW (ref 12.0–15.0)
WBC: 5.6 10*3/uL (ref 4.0–10.5)

## 2012-03-22 LAB — PROTIME-INR: INR: 3.28 — ABNORMAL HIGH (ref 0.00–1.49)

## 2012-03-22 MED ORDER — BUDESONIDE-FORMOTEROL FUMARATE 160-4.5 MCG/ACT IN AERO
2.0000 | INHALATION_SPRAY | Freq: Two times a day (BID) | RESPIRATORY_TRACT | Status: DC
  Filled 2012-03-22 (×2): qty 6
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
## 2012-03-22 NOTE — Progress Notes (Signed)
[facility] PROGRESS NOTE  Kristine Rios:[id number] DOB: 04-16-1976 DOA: 03/20/2012 PCP: Trevor Iha, MD  Assessment/Plan: Hemoptysis  -Hemoglobin has remained stable, remained hemodynamically stable  -No hemoptysis since hospitalization  -Supratherapeutic INR  -Continue to hold Coumadin--INR 3.28 today -?? Role of DDAVP if hemoglobin drops  -Continue to monitor closely  -Unclear if any other underlying pulmonary pathology--infarct versus vasculitis  -Appreciate pulmonary followup  Pulmonary embolus  -CT angiogram reveals subacute to chronic pulmonary emboli  -Hold off IVC filter for now--02/25/2012 lower extremity Dopplers indeterminate due to body habitus  -Lupus anticoagulant is negative, beta 2 glycoprotein negative, cardiolipin Ig A weakly positive, factor V leiden negative -full workup when finished with anticoagulation ESRD  -Appreciate nephrology followup --hemodialysis on Monday -Minimize heparin if possible Hypertension  -Well controlled  -Continue current regimen      Family Communication:   Sister at beside Disposition Plan:   Home when medically stable, cleared by pulm      Procedures/Studies: Dg Chest 2 View  03/21/2012  *RADIOLOGY REPORT*  Clinical Data: Hemoptysis.  History of pulmonary embolism.  CHEST - 2 VIEW  Comparison: Chest CT 03/20/2012 and chest radiograph 03/02/2012  Findings: Two views of the chest demonstrate a left jugular dialysis catheter.  Catheter tip in the right atrium.  There are subtle densities at the right costophrenic angle suggestive for scarring or atelectasis based on the previous CT findings.  There is no focal airspace disease or edema.  Heart size is within normal limits.  No definite pleural effusions.  IMPRESSION: Densities at the right costophrenic angle are most likely associated with scarring or atelectasis.  No focal airspace disease.   Original Report Authenticated By: Richarda Overlie, M.D.    Ct Angio Chest Pe  W/cm &/or Wo Cm  03/20/2012  *RADIOLOGY REPORT*  Clinical Data: Coughing and hemoptysis.  CT ANGIOGRAPHY CHEST  Technique:  Multidetector CT imaging of the chest using the standard protocol during bolus administration of intravenous contrast. Multiplanar reconstructed images including MIPs were obtained and reviewed to evaluate the vascular anatomy.  Contrast: OMNIPAQUE IOHEXOL 350 MG/ML SOLN  Comparison: Chest CT 02/24/2012  Findings: Again noted is an abnormal appearance of the left lower lobe pulmonary arteries.  There are low density filling defect in this area and the distal branches are small and likely occluded. In addition, there are filling defects involving the right lower lobe segmental branches best seen on sequence #18, 162.  This area was poorly imaged on the prior examination and difficult to know if this is new or chronic.  Calcifications in this region suggest that there could be a chronic component.  The main pulmonary arteries are patent.  The patient has a dialysis catheter and the tip extends into the right atrium.  Imaging of the upper abdomen demonstrates atrophic kidneys consistent a history of end-stage renal failure.  There is no significant pericardial or pleural fluid.  No significant lymphadenopathy.  There is soft tissue in the anterior mediastinum which likely represents thymus.  Small lymph nodes in the supraclavicular region are nonspecific.  The trachea and mainstem bronchi are patent.  There is chronic scarring along the pleural surface of the lateral right lower lobe. Chronic scarring and volume loss in the left lower lobe.  No acute bony abnormality.  IMPRESSION: Study is positive for pulmonary emboli.  The pulmonary emboli involving the left lower lobe pulmonary appear chronic since at least 02/24/2012.  The emboli within the right lower lobe branches are indeterminate for  age due to the limitations from the prior examination.  However, these clots could be old as well.   There are chronic areas of scarring in the lower lobes bilaterally. Some these findings could be related to areas of old infarction.  Atrophic kidneys consistent with end-stage renal disease.  These results were called by telephone on 03/20/2012 at 12:55 p.m. to Dr. Ignacia Palma, who verbally acknowledged these results.   Original Report Authenticated By: Richarda Overlie, M.D.    Ct Angio Chest W/cm &/or Wo Cm  02/24/2012  *RADIOLOGY REPORT*  Clinical Data: Hemoptysis, shortness of breath.  CT ANGIOGRAPHY CHEST  Technique:  Multidetector CT imaging of the chest using the standard protocol during bolus administration of intravenous contrast. Multiplanar reconstructed images including MIPs were obtained and reviewed to evaluate the vascular anatomy.  Contrast: 80mL OMNIPAQUE IOHEXOL 350 MG/ML SOLN  Comparison: Chest radiograph same day.  Findings: Examination was performed with a 22 gauge IV. Opacification of the pulmonary arteries is suboptimal.  Together with respiratory motion, evaluation of the segmental and subsegmental pulmonary arteries is very limited.  There is probable low attenuation thrombus at the bifurcation of the left upper and left lower lobe pulmonary arteries (example image series 6, image 89).  Additional suspicious low attenuation thrombus is seen in the left lower lobe pulmonary artery, at the lobar level (series 6, image 110).  Probable thymic tissue in the prevascular space.  No pathologically enlarged mediastinal, hilar or axillary lymph nodes.  Pulmonary arteries are enlarged.  There does not appear to be straightening of the interventricular septum.  Heart is mildly enlarged. Dialysis catheter tips terminate in the right atrium.  No pericardial effusion.  Subpleural atelectasis and/or scarring in both lower lobes and lingula.  Mild added density throughout the lungs is most consistent with expiratory phase imaging.  No pleural fluid. Airway is otherwise unremarkable.  Incidental imaging of the  upper abdomen no worrisome lytic or sclerotic lesions.  IMPRESSION:  1.  Examination is limited by suboptimal opacification of the pulmonary arteries and respiratory motion.  However, there are suspected low attenuation filling defects in the distal left main and left lower lobe pulmonary arteries, highly worrisome for pulmonary emboli.  No evidence of right heart strain. 2.  Pulmonary arterial hypertension.   Original Report Authenticated By: Reyes Ivan, M.D.    Dg Chest Port 1 View  03/02/2012  *RADIOLOGY REPORT*  Clinical Data: Status post hemodialysis, bleeding  PORTABLE CHEST - 1 VIEW  Comparison: 02/24/2012  Findings: The heart and pulmonary vascularity are within normal limits.  A left dialysis catheter is again noted.  The lungs are clear.  IMPRESSION: No acute abnormality noted.   Original Report Authenticated By: Phillips Odor, M.D.    Dg Chest Portable 1 View  02/24/2012  *RADIOLOGY REPORT*  Clinical Data: Hemoptysis.  PORTABLE CHEST - 1 VIEW  Comparison: 07/06/2011.  Findings: The patient's head obscures the superior mediastinum and medial lung apices.  Left IJ dialysis catheter tips project over the SVC/RA junction and right atrium, respectively.  Heart is enlarged, stable.  Mild diffuse interstitial prominence and indistinctness.  IMPRESSION: Suspect mild edema.   Original Report Authenticated By: Reyes Ivan, M.D.          Subjective: Patient is feeling well today. She denies any fevers, chills, chest pain, shortness breath, nausea, vomiting, diarrhea, abdominal pain, rashes, dizziness. She has not had any further hemoptysis.  Objective: Filed Vitals:   03/21/12 1549 03/21/12 2121 03/22/12 0542 03/22/12 1103  BP: 111/72 107/76 100/73 129/75  Pulse: 86 82 86 74  Temp: 98.1 F (36.7 C) 98.4 F (36.9 C) 97.8 F (36.6 C) 98.3 F (36.8 C)  TempSrc: Oral Oral Oral Oral  Resp: 18 18 18 18   Height:  5\' 3"  (1.6 m)    Weight:  131.6 kg (290 lb 2 oz)    SpO2: 95%  96% 94% 96%    Intake/Output Summary (Last 24 hours) at 03/22/12 1258 Last data filed at 03/21/12 2300  Gross per 24 hour  Intake    600 ml  Output      0 ml  Net    600 ml   Weight change: -0.2 kg (-7.1 oz) Exam:   General:  Pt is alert, follows commands appropriately, not in acute distress  HEENT: No icterus, No thrush,[place]/AT  Cardiovascular: RRR, S1/S2, no rubs, no gallops  Respiratory: CTA bilaterally, no wheezing, no crackles, no rhonchi  Abdomen: Soft/+BS, non tender, non distended, no guarding  Extremities: No edema, No lymphangitis, No petechiae, No rashes, no synovitis  Data Reviewed: Basic Metabolic Panel:  Lab 03/21/12 7829 03/20/12 1454 03/20/12 1051  NA 137 137 138  K 4.3 6.4* 6.2*  CL 98 99 99  CO2 25 20 21   GLUCOSE 96 98 84  BUN 24* 66* 63*  CREATININE 6.04* 11.89* 11.23*  CALCIUM 8.4 7.8* 8.3*  MG -- -- --  PHOS -- 7.2* --   Liver Function Tests:  Lab 03/20/12 1454  AST --  ALT --  ALKPHOS --  BILITOT --  PROT --  ALBUMIN 3.0*   No results found for this basename: LIPASE:5,AMYLASE:5 in the last 168 hours No results found for this basename: AMMONIA:5 in the last 168 hours CBC:  Lab 03/22/12 0605 03/21/12 0400 03/20/12 1454 03/20/12 1051  WBC 5.6 6.0 7.1 6.5  NEUTROABS -- -- -- 4.5  HGB 10.1* 10.2* 10.2* 11.0*  HCT 30.1* 30.6* 30.7* 33.3*  MCV 84.3 84.3 83.2 83.7  PLT 169 187 195 196   Cardiac Enzymes: No results found for this basename: CKTOTAL:5,CKMB:5,CKMBINDEX:5,TROPONINI:5 in the last 168 hours BNP: No components found with this basename: POCBNP:5 CBG: No results found for this basename: GLUCAP:5 in the last 168 hours  No results found for this or any previous visit (from the past 240 hour(s)).   Scheduled Meds:   . amLODipine  10 mg Oral QHS  . budesonide-formoterol  2 puff Inhalation BID  . calcium acetate (Phos Binder)  2,001 mg Oral TID WC  . cinacalcet  90 mg Oral QODAY  . darbepoetin (ARANESP) injection - DIALYSIS   60 mcg Intravenous Q Wed-HD  . heparin lock flush  500 Units Intravenous Once  . lisinopril  20 mg Oral Daily  . lisinopril  40 mg Oral QHS  . multivitamin  1 tablet Oral Daily  . sodium chloride  3 mL Intravenous Q12H  . sodium chloride  3 mL Intravenous Q12H  . [DISCONTINUED] calcium acetate (Phos Binder)  2,007 mg Oral TID WC  . [DISCONTINUED] calcium acetate  2,001 mg Oral TID WC   Continuous Infusions:    Kristine Grape, DO  [facility] Pager [phone]  If 7PM-7AM, please contact night-coverage [email or web address] Password [email or web address] 03/22/2012, 12:58 PM   LOS: 2 days

## 2012-03-23 LAB — PROTIME-INR: Prothrombin Time: 22.8 seconds — ABNORMAL HIGH (ref 11.6–15.2)

## 2012-03-23 LAB — CBC
Hemoglobin: 9.5 g/dL — ABNORMAL LOW (ref 12.0–15.0)
MCH: 27.9 pg (ref 26.0–34.0)
MCV: 84.4 fL (ref 78.0–100.0)
RBC: 3.4 MIL/uL — ABNORMAL LOW (ref 3.87–5.11)

## 2012-03-23 LAB — RENAL FUNCTION PANEL
Albumin: 2.9 g/dL — ABNORMAL LOW (ref 3.5–5.2)
BUN: 70 mg/dL — ABNORMAL HIGH (ref 6–23)
Chloride: 97 mEq/L (ref 96–112)
GFR calc Af Amer: 4 mL/min — ABNORMAL LOW (ref >90)
Glucose, Bld: 90 mg/dL (ref 70–99)
Potassium: 5 mEq/L (ref 3.5–5.1)
Sodium: 136 mEq/L (ref 135–145)

## 2012-03-23 MED ORDER — WARFARIN SODIUM 5 MG PO TABS: 5.0000 mg | ORAL_TABLET | Freq: Every day | ORAL | Status: DC

## 2012-03-23 MED ORDER — HEPARIN SODIUM (PORCINE) 1000 UNIT/ML DIALYSIS: 1000.0000 [IU] | INTRAMUSCULAR | Status: DC | PRN

## 2012-03-23 MED ORDER — SODIUM CHLORIDE 0.9 % IV SOLN: 100.0000 mL | INTRAVENOUS | Status: DC | PRN

## 2012-03-23 MED ORDER — PENTAFLUOROPROP-TETRAFLUOROETH EX AERO: 1.0000 "application " | INHALATION_SPRAY | CUTANEOUS | Status: DC | PRN

## 2012-03-23 MED ORDER — LIDOCAINE-PRILOCAINE 2.5-2.5 % EX CREA: 1.0000 "application " | TOPICAL_CREAM | CUTANEOUS | Status: DC | PRN

## 2012-03-23 MED ORDER — NEPRO/CARBSTEADY PO LIQD: 237.0000 mL | ORAL | Status: DC | PRN

## 2012-03-23 MED ORDER — ALTEPLASE 2 MG IJ SOLR: 2.0000 mg | Freq: Once | INTRAMUSCULAR | Status: DC | PRN

## 2012-03-23 MED ORDER — LIDOCAINE HCL (PF) 1 % IJ SOLN: 5.0000 mL | INTRAMUSCULAR | Status: DC | PRN

## 2012-03-23 NOTE — Progress Notes (Signed)
Pt. Got d./c orders and instructions.prescription was given as well.tele was d/c,IV was d/c.pt. Ready to go home.

## 2012-03-23 NOTE — Progress Notes (Signed)
[facility] Progress Note  Subjective:  No complaints. Reading on HD  Objective Filed Vitals:   03/23/12 0645 03/23/12 0700 03/23/12 0730 03/23/12 0759  BP: 143/73 137/72 143/78 123/72  Pulse: 87 84 92 86  Temp:      TempSrc:      Resp:      Height:      Weight:      SpO2:       Physical Exam goal 6.6 General:comfortable breathing easily Heart:RRR Lungs: no wheezes or rales Abdomen: obese soft Extremities: no significant edema Dialysis Access: left IJ Qb 400  Dialysis Orders: Center: [facility] on mwf .  EDW 126.5 kg HD Bath 2.0 k, 2.5 ca Time 4hrs Heparin tight. Access L IJ Perm cath BFR 400 DFR 800  Zemplar 0 mcg IV/HD Epogen 9000 Units IV/HD Venofer 0   Assessment/Plan: 1. Hemoptysis/hx PE/supratherpeutic INR - INR now 2.11; ok for discharge.  Would like pharmacy recommendations as to how to resume coumadin dose at discharge. 2. ESRD - HD today per MWF routine K 5; 2 K bath; no heparin HD here; has been receiving tight as an outpatient; continue no heparin as an outpt for now with flushes q 30 minutes 3. Anemia - Hgb down slightly today 9.5; no change in management on 60 Aranesp 4. Secondary hyperparathyroidism - P 8,4; had gotten under control last hospitalization; continue same binders and sensipar 5. HTN/volume - controlled with meds and volume removal; prone to large weight gains. 6. Nutrition - albumin low; changed to high protein renal diet Sheffield Slider, PA-C [facility] Beeper [phone]  03/23/2012,8:16 AM  LOS: 3 days   Patient seen and examined and agree with assessment and plan as above.  Kristine Moselle  MD [facility] [phone] pgr    [phone] cell 03/23/2012, 1:04 PM   Additional Objective Labs: Lab Results  Component Value Date   INR 2.11* 03/23/2012   INR 3.28* 03/22/2012   INR 3.78* 03/21/2012    Basic Metabolic Panel:  Lab 03/23/12 1324 03/21/12 0400 03/20/12 1454  NA 136 137 137  K 5.0  4.3 6.4*  CL 97 98 99  CO2 22 25 20   GLUCOSE 90 96 98  BUN 70* 24* 66*  CREATININE 12.46* 6.04* 11.89*  CALCIUM 8.7 8.4 7.8*  ALB -- -- --  PHOS 8.4* -- 7.2*   Liver Function Tests:  Lab 03/23/12 0655 03/20/12 1454  AST -- --  ALT -- --  ALKPHOS -- --  BILITOT -- --  PROT -- --  ALBUMIN 2.9* 3.0*   CBC:  Lab 03/23/12 0655 03/22/12 0605 03/21/12 0400 03/20/12 1454 03/20/12 1051  WBC 6.1 5.6 6.0 -- --  NEUTROABS -- -- -- -- 4.5  HGB 9.5* 10.1* 10.2* -- --  HCT 28.7* 30.1* 30.6* -- --  MCV 84.4 84.3 84.3 83.2 83.7  PLT 168 169 187 -- --  Medications:      . amLODipine  10 mg Oral QHS  . budesonide-formoterol  2 puff Inhalation BID  . calcium acetate (Phos Binder)  2,001 mg Oral TID WC  . cinacalcet  90 mg Oral QODAY  . darbepoetin (ARANESP) injection - DIALYSIS  60 mcg Intravenous Q Wed-HD  . heparin lock flush  500 Units Intravenous Once  . lisinopril  20 mg Oral Daily  . lisinopril  40 mg Oral QHS  . multivitamin  1 tablet Oral Daily  . sodium chloride  3 mL Intravenous Q12H  .  sodium chloride  3 mL Intravenous Q12H

## 2012-03-23 NOTE — Progress Notes (Signed)
PHARMACIST - PHYSICIAN COMMUNICATION  CONCERNING:  Coumadin   RECOMMENDATION: Recommend a home dose of 5 mg daily.  Goal INR is 2 to 2.5 Follow up INR Wednesday or Thursday.  Thank you. Okey Regal, PharmD [phone]

## 2012-03-23 NOTE — Progress Notes (Signed)
PULMONARY  / CRITICAL CARE MEDICINE  Name: Kristine Rios MRN: [id number] DOB: 1976/05/03    LOS: 3   BRIEF PATIENT DESCRIPTION:  75 YOF w/ ESRD, recent dx of acute PE complicated by some hemoptysis. D/c on 10/30. Presents 11/15 with acute onset of hemoptysis.   Subjective/overnight:  No c/o.  No further hemoptysis. No chest pain, dyspnea  SIGNIFICANT EVENTS:  CT chest 11/15: positive for pulmonary emboli. The pulmonary emboli involving the left lower lobe pulmonary appear chronic since at least 02/24/2012. The emboli within the right lower lobe branches are indeterminate for age due to the limitations from the prior examination. However, these clots could be old as well. There are chronic areas of scarring in the lower lobes bilaterally. Some these findings could be related to areas of old infarction.   VITAL SIGNS: Temp:  [98.2 F (36.8 C)-98.5 F (36.9 C)] 98.2 F (36.8 C) (11/18 0630) Pulse Rate:  [74-96] 82  (11/18 0859) Resp:  [16-18] 16  (11/18 0630) BP: (119-158)/(62-86) 131/83 mmHg (11/18 0859) SpO2:  [92 %-97 %] 92 % (11/18 0630) Weight:  [294 lb 8.6 oz (133.6 kg)-296 lb 8.3 oz (134.5 kg)] 294 lb 8.6 oz (133.6 kg) (11/18 0630)   PHYSICAL EXAMINATION: General:  Obese not in acute distress. Currently sitting up in bed Neuro:  No focal def  HEENT:  [place], no JVD Cardiovascular:  Rrr/ distant Lungs:  Faint insp wheeze, predom upper airway otherwise clear  Abdomen:  Soft, non tender, obese  Ext: no edema   Lab 03/23/12 0655 03/21/12 0400 03/20/12 1454  NA 136 137 137  K 5.0 4.3 6.4*  CL 97 98 99  CO2 22 25 20   BUN 70* 24* 66*  CREATININE 12.46* 6.04* 11.89*  GLUCOSE 90 96 98    Lab 03/23/12 0655 03/22/12 0605 03/21/12 0400  HGB 9.5* 10.1* 10.2*  HCT 28.7* 30.1* 30.6*  WBC 6.1 5.6 6.0  PLT 168 169 187   Lab Results  Component Value Date   INR 2.11* 03/23/2012   INR 3.28* 03/22/2012   INR 3.78* 03/21/2012     ASSESSMENT / PLAN: 1) Hemoptysis in setting of  supra therapeutic INR - no further hemoptysis - PRN cough suppression   2) Pulmonary Emboli. New dx. Just d/c'd on 10/30. CT reviewed. Resp status stable.  No further hemoptysis.  INR 2.1 - ok to resume coumadin, gets INR checked on HD - ? Renal follows, duration 6 months  3) ESRD (MWF)>  Per renal   4) supra therapeutic INR> PT/INR improved today & Coumadin on hold per Pharm protocol...   Goal now 2-2.5.  resume per pharmacy  No further hemoptysis now that INR wnl. Goal adjusted.    PCCM signing off, please call back if needed.  OK to dc from our standpt  [facility], NP 03/23/2012  9:23 AM Pager: [phone] or [phone]  *Care during the described time interval was provided by me and/or other providers on the critical care team. I have reviewed this patient's available data, including medical history, events of note, physical examination and test results as part of my evaluation.  Chaun Uemura V.

## 2012-03-23 NOTE — Discharge Summary (Signed)
Physician Discharge Summary  GWENDLYN CONDITT ZOX:[id number] DOB: May 25, 1975 DOA: 03/20/2012  PCP: Trevor Iha, MD  Admit date: 03/20/2012 Discharge date: 03/23/2012  Recommendations for Outpatient Follow-up:  1. Pt will need to follow up with PCP in 2 weeks post discharge  Discharge Diagnoses:  Hemoptysis  -Hemoglobin has remained stable, remained hemodynamically stable  -No hemoptysis since hospitalization  -Supratherapeutic INR  -Held Coumadin--INR 2.11 today  -Restart warfarin 5 mg daily as an outpatient. -INR checked at dialysis center on Wednesday 03/25/2012 -consider DDAVP if hemoglobin drops  -Continue to monitor closely  -Unclear if any other underlying pulmonary pathology--infarct versus vasculitis  -Appreciate pulmonary followup  Pulmonary embolus  -CT angiogram reveals subacute to chronic pulmonary emboli  -Hold off IVC filter for now--02/25/2012 lower extremity Dopplers indeterminate due to body habitus  -Lupus anticoagulant is negative, beta 2 glycoprotein negative, cardiolipin Ig A weakly positive, factor V leiden negative  -full workup when finished with anticoagulation  ESRD  -Appreciate nephrology followup --hemodialysis on Monday  -Minimize heparin if possible  Hypertension  -Well controlled  -Continue current regimen   Discharge Condition: stable  Disposition: d/c home  Diet:renal Wt Readings from Last 3 Encounters:  03/23/12 125.4 kg (276 lb 7.3 oz)  03/04/12 126.2 kg (278 lb 3.5 oz)  03/04/12 126.2 kg (278 lb 3.5 oz)    History of present illness:  36 y.o. 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 She reports several episodes of hemoptysis- frank blood, has not had any further hemoptysis in the ED. She states that she has had increased shortness of breath for 2  days. She denies cough, fevers.chest pain, and no leg swelling. She was seen in the ED and and a CT angio was done was positive for pulmonary emboli involving the left lower lobe that appeared to be of chronic since and 10/21, and also emboli within the right lower lobe branches reported as intermediate for age due to limitations from poor examination-but that this clots could be old as well per radiology. Her INR in the ED was supratherapeutic at 3.9, and she remained hemodynamically stable in the ED. labs revealed a potassium of 6.2, Renal was consulted per the ED for dialysis.   Hospital Course:  Pulmonology was consulted to see the patient due to her frank hemoptysis. He did not recommend any acute intervention at this time. They recommended conservative therapy and monitoring off warfarin. The patient was taken off of her warfarin her INR was monitored. Her INR continued to trend down throughout the hospitalization. On the day of discharge, INR was 2.11. Pulmonary recommended when necessary cough suppression if necessary. They did not feel that the patient would need an IVC filter. The patient did not have any further hemoptysis throughout the hospitalization patient remained afebrile and hemodynamically stable. Her hemoglobin remained stable. The nephrology team continued to get the patient for her dialysis needs. The patient did not receive any further heparin on dialysis. As an outpatient the patient was getting tight heparin. The patient was instructed to take warfarin 5 mg daily and to get her INR checked in a dialysis center as an outpatient, with the next check on Wednesday, 03/25/2012.. Further adjustment will be made upon her outpatient INR checks. The patient's blood pressure remained stable on her antihypertensives.  Consultants: Nephrology, pulmonary  Discharge Exam: Filed Vitals:   03/23/12 1100  BP: 100/59  Pulse: 91  Temp: 98 F (36.7 C)  Resp: 16   Filed Vitals:   03/23/12  1000 03/23/12 1030 03/23/12 1042 03/23/12 1100  BP: 135/83 123/64 103/64 100/59  Pulse: 79 96 98 91  Temp:   98 F (36.7 C) 98 F (36.7 C)  TempSrc:   Oral Oral  Resp:   13 16  Height:      Weight:   125.4 kg (276 lb 7.3 oz)   SpO2:   97% 95%   General: A&O x 3, NAD, pleasant, cooperative Cardiovascular: RRR, no rub, no gallop, no S3 Respiratory: CTAB, no wheeze, no rhonchi Abdomen:soft, nontender, nondistended, positive bowel sounds Extremities: No edema, No lymphangitis, no petechiae  Discharge Instructions      Discharge Orders    Future Appointments: Provider: Department: Dept Phone: Center:   03/30/2012 10:15 AM Nada Libman, MD [facility] [phone] [phone]     Future Orders Please Complete By Expires   Diet - low sodium heart healthy      Increase activity slowly      Discharge instructions      Comments:   Start taking Coumadin 5mg  once daily starting tonight Get INR check at dialysis center on Wednesday, 03/25/12 Future coumadin dosing per dialysis center checks       Medication List     As of 03/23/2012  2:07 PM    TAKE these medications         amLODipine 10 MG tablet   Commonly known as: NORVASC   Take 10 mg by mouth at bedtime.      b complex-vitamin c-folic acid 0.8 MG Tabs   Take 0.8 mg by mouth at bedtime.      calcium acetate (Phos Binder) 667 MG/5ML Soln   Commonly known as: PHOSLYRA   Take 2,001 mg by mouth 3 (three) times daily with meals.      cinacalcet 90 MG tablet   Commonly known as: SENSIPAR   Take 90 mg by mouth every other day.      lisinopril 20 MG tablet   Commonly known as: PRINIVIL,ZESTRIL   Take 20-40 mg by mouth 2 (two) times daily. Take 1 tablet in the morning and 2 tablets in the evening      warfarin 5 MG tablet   Commonly known as: COUMADIN   Take 1 tablet (5 mg total) by mouth daily.          The results of significant diagnostics from this hospitalization (including imaging,  microbiology, ancillary and laboratory) are listed below for reference.    Significant Diagnostic Studies: Dg Chest 2 View  03/21/2012  *RADIOLOGY REPORT*  Clinical Data: Hemoptysis.  History of pulmonary embolism.  CHEST - 2 VIEW  Comparison: Chest CT 03/20/2012 and chest radiograph 03/02/2012  Findings: Two views of the chest demonstrate a left jugular dialysis catheter.  Catheter tip in the right atrium.  There are subtle densities at the right costophrenic angle suggestive for scarring or atelectasis based on the previous CT findings.  There is no focal airspace disease or edema.  Heart size is within normal limits.  No definite pleural effusions.  IMPRESSION: Densities at the right costophrenic angle are most likely associated with scarring or atelectasis.  No focal airspace disease.   Original Report Authenticated By: Richarda Overlie, M.D.    Ct Angio Chest Pe W/cm &/or Wo Cm  03/20/2012  *RADIOLOGY REPORT*  Clinical Data: Coughing and hemoptysis.  CT  ANGIOGRAPHY CHEST  Technique:  Multidetector CT imaging of the chest using the standard protocol during bolus administration of intravenous contrast. Multiplanar reconstructed images including MIPs were obtained and reviewed to evaluate the vascular anatomy.  Contrast: OMNIPAQUE IOHEXOL 350 MG/ML SOLN  Comparison: Chest CT 02/24/2012  Findings: Again noted is an abnormal appearance of the left lower lobe pulmonary arteries.  There are low density filling defect in this area and the distal branches are small and likely occluded. In addition, there are filling defects involving the right lower lobe segmental branches best seen on sequence #18, 162.  This area was poorly imaged on the prior examination and difficult to know if this is new or chronic.  Calcifications in this region suggest that there could be a chronic component.  The main pulmonary arteries are patent.  The patient has a dialysis catheter and the tip extends into the right atrium.  Imaging  of the upper abdomen demonstrates atrophic kidneys consistent a history of end-stage renal failure.  There is no significant pericardial or pleural fluid.  No significant lymphadenopathy.  There is soft tissue in the anterior mediastinum which likely represents thymus.  Small lymph nodes in the supraclavicular region are nonspecific.  The trachea and mainstem bronchi are patent.  There is chronic scarring along the pleural surface of the lateral right lower lobe. Chronic scarring and volume loss in the left lower lobe.  No acute bony abnormality.  IMPRESSION: Study is positive for pulmonary emboli.  The pulmonary emboli involving the left lower lobe pulmonary appear chronic since at least 02/24/2012.  The emboli within the right lower lobe branches are indeterminate for age due to the limitations from the prior examination.  However, these clots could be old as well.  There are chronic areas of scarring in the lower lobes bilaterally. Some these findings could be related to areas of old infarction.  Atrophic kidneys consistent with end-stage renal disease.  These results were called by telephone on 03/20/2012 at 12:55 p.m. to Dr. Ignacia Palma, who verbally acknowledged these results.   Original Report Authenticated By: Richarda Overlie, M.D.    Ct Angio Chest W/cm &/or Wo Cm  02/24/2012  *RADIOLOGY REPORT*  Clinical Data: Hemoptysis, shortness of breath.  CT ANGIOGRAPHY CHEST  Technique:  Multidetector CT imaging of the chest using the standard protocol during bolus administration of intravenous contrast. Multiplanar reconstructed images including MIPs were obtained and reviewed to evaluate the vascular anatomy.  Contrast: 80mL OMNIPAQUE IOHEXOL 350 MG/ML SOLN  Comparison: Chest radiograph same day.  Findings: Examination was performed with a 22 gauge IV. Opacification of the pulmonary arteries is suboptimal.  Together with respiratory motion, evaluation of the segmental and subsegmental pulmonary arteries is very limited.   There is probable low attenuation thrombus at the bifurcation of the left upper and left lower lobe pulmonary arteries (example image series 6, image 89).  Additional suspicious low attenuation thrombus is seen in the left lower lobe pulmonary artery, at the lobar level (series 6, image 110).  Probable thymic tissue in the prevascular space.  No pathologically enlarged mediastinal, hilar or axillary lymph nodes.  Pulmonary arteries are enlarged.  There does not appear to be straightening of the interventricular septum.  Heart is mildly enlarged. Dialysis catheter tips terminate in the right atrium.  No pericardial effusion.  Subpleural atelectasis and/or scarring in both lower lobes and lingula.  Mild added density throughout the lungs is most consistent with expiratory phase imaging.  No pleural fluid. Airway is  otherwise unremarkable.  Incidental imaging of the upper abdomen no worrisome lytic or sclerotic lesions.  IMPRESSION:  1.  Examination is limited by suboptimal opacification of the pulmonary arteries and respiratory motion.  However, there are suspected low attenuation filling defects in the distal left main and left lower lobe pulmonary arteries, highly worrisome for pulmonary emboli.  No evidence of right heart strain. 2.  Pulmonary arterial hypertension.   Original Report Authenticated By: Reyes Ivan, M.D.    Dg Chest Port 1 View  03/02/2012  *RADIOLOGY REPORT*  Clinical Data: Status post hemodialysis, bleeding  PORTABLE CHEST - 1 VIEW  Comparison: 02/24/2012  Findings: The heart and pulmonary vascularity are within normal limits.  A left dialysis catheter is again noted.  The lungs are clear.  IMPRESSION: No acute abnormality noted.   Original Report Authenticated By: Phillips Odor, M.D.    Dg Chest Portable 1 View  02/24/2012  *RADIOLOGY REPORT*  Clinical Data: Hemoptysis.  PORTABLE CHEST - 1 VIEW  Comparison: 07/06/2011.  Findings: The patient's head obscures the superior mediastinum  and medial lung apices.  Left IJ dialysis catheter tips project over the SVC/RA junction and right atrium, respectively.  Heart is enlarged, stable.  Mild diffuse interstitial prominence and indistinctness.  IMPRESSION: Suspect mild edema.   Original Report Authenticated By: Reyes Ivan, M.D.      Microbiology: No results found for this or any previous visit (from the past 240 hour(s)).   Labs: Basic Metabolic Panel:  Lab 03/23/12 9604 03/21/12 0400 03/20/12 1454 03/20/12 1051  NA 136 137 137 138  K 5.0 4.3 -- --  CL 97 98 99 99  CO2 22 25 20 21   GLUCOSE 90 96 98 84  BUN 70* 24* 66* 63*  CREATININE 12.46* 6.04* 11.89* 11.23*  CALCIUM 8.7 8.4 7.8* 8.3*  MG -- -- -- --  PHOS 8.4* -- 7.2* --   Liver Function Tests:  Lab 03/23/12 0655 03/20/12 1454  AST -- --  ALT -- --  ALKPHOS -- --  BILITOT -- --  PROT -- --  ALBUMIN 2.9* 3.0*   No results found for this basename: LIPASE:5,AMYLASE:5 in the last 168 hours No results found for this basename: AMMONIA:5 in the last 168 hours CBC:  Lab 03/23/12 0655 03/22/12 0605 03/21/12 0400 03/20/12 1454 03/20/12 1051  WBC 6.1 5.6 6.0 7.1 6.5  NEUTROABS -- -- -- -- 4.5  HGB 9.5* 10.1* 10.2* 10.2* 11.0*  HCT 28.7* 30.1* 30.6* 30.7* 33.3*  MCV 84.4 84.3 84.3 83.2 83.7  PLT 168 169 187 195 196   Cardiac Enzymes: No results found for this basename: CKTOTAL:5,CKMB:5,CKMBINDEX:5,TROPONINI:5 in the last 168 hours BNP: No components found with this basename: POCBNP:5 CBG: No results found for this basename: GLUCAP:5 in the last 168 hours  Time coordinating discharge:  Greater than 30 minutes  Signed:  Dareth Andrew, DO [facility] Pager: [phone] 03/23/2012, 2:07 PM

## 2012-03-25 DIAGNOSIS — D509 Iron deficiency anemia, unspecified: Secondary | ICD-10-CM | POA: Diagnosis not present

## 2012-03-25 DIAGNOSIS — N2581 Secondary hyperparathyroidism of renal origin: Secondary | ICD-10-CM | POA: Diagnosis not present

## 2012-03-25 DIAGNOSIS — N186 End stage renal disease: Secondary | ICD-10-CM | POA: Diagnosis not present

## 2012-03-25 DIAGNOSIS — Z111 Encounter for screening for respiratory tuberculosis: Secondary | ICD-10-CM | POA: Diagnosis not present

## 2012-03-25 DIAGNOSIS — I2699 Other pulmonary embolism without acute cor pulmonale: Secondary | ICD-10-CM | POA: Diagnosis not present

## 2012-03-25 DIAGNOSIS — N039 Chronic nephritic syndrome with unspecified morphologic changes: Secondary | ICD-10-CM | POA: Diagnosis not present

## 2012-03-26 ENCOUNTER — Ambulatory Visit (INDEPENDENT_AMBULATORY_CARE_PROVIDER_SITE_OTHER): Payer: Medicare Other | Admitting: Pulmonary Disease

## 2012-03-26 ENCOUNTER — Encounter: Payer: Self-pay | Admitting: Pulmonary Disease

## 2012-03-26 VITALS — BP 126/82 | HR 96 | Temp 97.0°F | Ht 63.0 in | Wt 296.0 lb

## 2012-03-26 DIAGNOSIS — I2699 Other pulmonary embolism without acute cor pulmonale: Secondary | ICD-10-CM
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
## 2012-03-26 NOTE — Patient Instructions (Signed)
Coumadin indicated for 6 months - end April For graft procedure, bridge needed (with heparin) if planned before 3 months (Jan)

## 2012-03-26 NOTE — Assessment & Plan Note (Signed)
Hemoptysis appears to have subsided - Aim for INR 2.0-2.5  Coumadin indicated for 6 months - upto end April For graft procedure, bridge needed (with heparin) if planned before 3 months (Jan) to ensure minimum days off anticoagulation.

## 2012-03-27 DIAGNOSIS — Z111 Encounter for screening for respiratory tuberculosis: Secondary | ICD-10-CM | POA: Diagnosis not present

## 2012-03-27 DIAGNOSIS — D509 Iron deficiency anemia, unspecified: Secondary | ICD-10-CM | POA: Diagnosis not present

## 2012-03-27 DIAGNOSIS — N186 End stage renal disease: Secondary | ICD-10-CM | POA: Diagnosis not present

## 2012-03-27 DIAGNOSIS — D689 Coagulation defect, unspecified: Secondary | ICD-10-CM | POA: Diagnosis not present

## 2012-03-27 DIAGNOSIS — N039 Chronic nephritic syndrome with unspecified morphologic changes: Secondary | ICD-10-CM | POA: Diagnosis not present

## 2012-03-27 DIAGNOSIS — N2581 Secondary hyperparathyroidism of renal origin: Secondary | ICD-10-CM | POA: Diagnosis not present

## 2012-03-30 ENCOUNTER — Ambulatory Visit: Payer: Medicare Other | Admitting: Surgery

## 2012-03-30 DIAGNOSIS — N186 End stage renal disease: Secondary | ICD-10-CM | POA: Diagnosis not present

## 2012-03-30 DIAGNOSIS — N039 Chronic nephritic syndrome with unspecified morphologic changes: Secondary | ICD-10-CM | POA: Diagnosis not present

## 2012-03-30 DIAGNOSIS — Z111 Encounter for screening for respiratory tuberculosis: Secondary | ICD-10-CM | POA: Diagnosis not present

## 2012-03-30 DIAGNOSIS — D509 Iron deficiency anemia, unspecified: Secondary | ICD-10-CM | POA: Diagnosis not present

## 2012-03-30 DIAGNOSIS — N2581 Secondary hyperparathyroidism of renal origin: Secondary | ICD-10-CM | POA: Diagnosis not present

## 2012-03-30 DIAGNOSIS — D689 Coagulation defect, unspecified: Secondary | ICD-10-CM | POA: Diagnosis not present

## 2012-04-01 ENCOUNTER — Encounter: Payer: Self-pay | Admitting: Surgery

## 2012-04-01 DIAGNOSIS — D509 Iron deficiency anemia, unspecified: Secondary | ICD-10-CM | POA: Diagnosis not present

## 2012-04-01 DIAGNOSIS — N186 End stage renal disease: Secondary | ICD-10-CM | POA: Diagnosis not present

## 2012-04-01 DIAGNOSIS — N2581 Secondary hyperparathyroidism of renal origin: Secondary | ICD-10-CM | POA: Diagnosis not present

## 2012-04-01 DIAGNOSIS — Z111 Encounter for screening for respiratory tuberculosis: Secondary | ICD-10-CM | POA: Diagnosis not present

## 2012-04-01 DIAGNOSIS — D631 Anemia in chronic kidney disease: Secondary | ICD-10-CM | POA: Diagnosis not present

## 2012-04-03 DIAGNOSIS — D631 Anemia in chronic kidney disease: Secondary | ICD-10-CM | POA: Diagnosis not present

## 2012-04-03 DIAGNOSIS — N2581 Secondary hyperparathyroidism of renal origin: Secondary | ICD-10-CM | POA: Diagnosis not present

## 2012-04-03 DIAGNOSIS — N039 Chronic nephritic syndrome with unspecified morphologic changes: Secondary | ICD-10-CM | POA: Diagnosis not present

## 2012-04-03 DIAGNOSIS — N186 End stage renal disease: Secondary | ICD-10-CM | POA: Diagnosis not present

## 2012-04-04 DIAGNOSIS — N186 End stage renal disease: Secondary | ICD-10-CM | POA: Diagnosis not present

## 2012-04-06 ENCOUNTER — Encounter: Payer: Self-pay | Admitting: Surgery

## 2012-04-06 ENCOUNTER — Ambulatory Visit: Payer: Medicare Other | Admitting: Surgery

## 2012-04-06 ENCOUNTER — Ambulatory Visit (INDEPENDENT_AMBULATORY_CARE_PROVIDER_SITE_OTHER): Payer: Medicare Other | Admitting: Surgery

## 2012-04-06 VITALS — BP 139/81 | HR 92 | Ht 63.0 in | Wt 298.0 lb

## 2012-04-06 DIAGNOSIS — N2581 Secondary hyperparathyroidism of renal origin: Secondary | ICD-10-CM | POA: Diagnosis not present

## 2012-04-06 DIAGNOSIS — D631 Anemia in chronic kidney disease: Secondary | ICD-10-CM | POA: Diagnosis not present

## 2012-04-06 DIAGNOSIS — N186 End stage renal disease: Secondary | ICD-10-CM | POA: Insufficient documentation

## 2012-04-06 DIAGNOSIS — Z111 Encounter for screening for respiratory tuberculosis: Secondary | ICD-10-CM | POA: Diagnosis not present

## 2012-04-06 DIAGNOSIS — D509 Iron deficiency anemia, unspecified: Secondary | ICD-10-CM | POA: Diagnosis not present
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
## 2012-04-07 DIAGNOSIS — M25569 Pain in unspecified knee: Secondary | ICD-10-CM | POA: Diagnosis not present

## 2012-04-07 DIAGNOSIS — M1710 Unilateral primary osteoarthritis, unspecified knee: Secondary | ICD-10-CM | POA: Diagnosis not present

## 2012-04-08 DIAGNOSIS — I2699 Other pulmonary embolism without acute cor pulmonale: Secondary | ICD-10-CM | POA: Diagnosis not present

## 2012-04-13 DIAGNOSIS — I2699 Other pulmonary embolism without acute cor pulmonale: Secondary | ICD-10-CM | POA: Diagnosis not present

## 2012-04-17 DIAGNOSIS — I2699 Other pulmonary embolism without acute cor pulmonale: Secondary | ICD-10-CM | POA: Diagnosis not present

## 2012-04-30 DIAGNOSIS — N186 End stage renal disease: Secondary | ICD-10-CM | POA: Diagnosis not present

## 2012-05-01 ENCOUNTER — Encounter (HOSPITAL_COMMUNITY): Payer: Self-pay | Admitting: Pharmacy Technician

## 2012-05-05 ENCOUNTER — Other Ambulatory Visit: Payer: Self-pay

## 2012-05-05 DIAGNOSIS — N186 End stage renal disease: Secondary | ICD-10-CM | POA: Diagnosis not present

## 2012-05-08 DIAGNOSIS — N186 End stage renal disease: Secondary | ICD-10-CM | POA: Diagnosis not present

## 2012-05-08 DIAGNOSIS — Z992 Dependence on renal dialysis: Secondary | ICD-10-CM | POA: Diagnosis not present

## 2012-05-08 DIAGNOSIS — D509 Iron deficiency anemia, unspecified: Secondary | ICD-10-CM | POA: Diagnosis not present

## 2012-05-08 DIAGNOSIS — E8779 Other fluid overload: Secondary | ICD-10-CM | POA: Diagnosis not present

## 2012-05-08 DIAGNOSIS — R6883 Chills (without fever): Secondary | ICD-10-CM | POA: Diagnosis not present

## 2012-05-08 DIAGNOSIS — N2581 Secondary hyperparathyroidism of renal origin: Secondary | ICD-10-CM | POA: Diagnosis not present

## 2012-05-08 DIAGNOSIS — N039 Chronic nephritic syndrome with unspecified morphologic changes: Secondary | ICD-10-CM | POA: Diagnosis not present

## 2012-05-09 DIAGNOSIS — R6883 Chills (without fever): Secondary | ICD-10-CM | POA: Diagnosis not present

## 2012-05-09 DIAGNOSIS — Z992 Dependence on renal dialysis: Secondary | ICD-10-CM | POA: Diagnosis not present

## 2012-05-09 DIAGNOSIS — D631 Anemia in chronic kidney disease: Secondary | ICD-10-CM | POA: Diagnosis not present

## 2012-05-09 DIAGNOSIS — N039 Chronic nephritic syndrome with unspecified morphologic changes: Secondary | ICD-10-CM | POA: Diagnosis not present

## 2012-05-09 DIAGNOSIS — N186 End stage renal disease: Secondary | ICD-10-CM | POA: Diagnosis not present

## 2012-05-09 DIAGNOSIS — N2581 Secondary hyperparathyroidism of renal origin: Secondary | ICD-10-CM | POA: Diagnosis not present

## 2012-05-09 DIAGNOSIS — D509 Iron deficiency anemia, unspecified: Secondary | ICD-10-CM | POA: Diagnosis not present

## 2012-05-11 DIAGNOSIS — D631 Anemia in chronic kidney disease: Secondary | ICD-10-CM | POA: Diagnosis not present

## 2012-05-11 DIAGNOSIS — Z992 Dependence on renal dialysis: Secondary | ICD-10-CM | POA: Diagnosis not present

## 2012-05-11 DIAGNOSIS — D509 Iron deficiency anemia, unspecified: Secondary | ICD-10-CM | POA: Diagnosis not present

## 2012-05-11 DIAGNOSIS — N186 End stage renal disease: Secondary | ICD-10-CM | POA: Diagnosis not present

## 2012-05-11 DIAGNOSIS — N2581 Secondary hyperparathyroidism of renal origin: Secondary | ICD-10-CM | POA: Diagnosis not present

## 2012-05-11 DIAGNOSIS — R6883 Chills (without fever): Secondary | ICD-10-CM | POA: Diagnosis not present

## 2012-05-13 ENCOUNTER — Encounter (HOSPITAL_COMMUNITY): Payer: Self-pay | Admitting: *Deleted

## 2012-05-13 DIAGNOSIS — Z992 Dependence on renal dialysis: Secondary | ICD-10-CM | POA: Diagnosis not present

## 2012-05-13 DIAGNOSIS — N186 End stage renal disease: Secondary | ICD-10-CM | POA: Diagnosis not present

## 2012-05-13 DIAGNOSIS — D509 Iron deficiency anemia, unspecified: Secondary | ICD-10-CM | POA: Diagnosis not present

## 2012-05-13 DIAGNOSIS — R6883 Chills (without fever): Secondary | ICD-10-CM | POA: Diagnosis not present

## 2012-05-13 DIAGNOSIS — D631 Anemia in chronic kidney disease: Secondary | ICD-10-CM | POA: Diagnosis not present

## 2012-05-13 DIAGNOSIS — N2581 Secondary hyperparathyroidism of renal origin: Secondary | ICD-10-CM | POA: Diagnosis not present

## 2012-05-13 MED ORDER — SODIUM CHLORIDE 0.9 % IV SOLN
INTRAVENOUS | Status: DC
  Administered 2012-05-14: 35 mL/h via INTRAVENOUS

## 2012-05-13 MED ORDER — DEXTROSE 5 % IV SOLN
3.0000 g | Freq: Once | INTRAVENOUS | Status: AC
  Administered 2012-05-14: 3 g via INTRAVENOUS
  Filled 2012-05-13: qty 3000

## 2012-05-13 MED ORDER — SODIUM CHLORIDE 0.9 % IJ SOLN: 3.0000 mL | INTRAMUSCULAR | Status: DC | PRN

## 2012-05-14 ENCOUNTER — Encounter (HOSPITAL_COMMUNITY): Payer: Self-pay | Admitting: Anesthesiology

## 2012-05-14 ENCOUNTER — Encounter (HOSPITAL_COMMUNITY)
Admission: RE | Disposition: A | Discharge status: Home / Self Care | Payer: Self-pay | Source: Ambulatory Visit | Attending: Surgery

## 2012-05-14 ENCOUNTER — Encounter (HOSPITAL_COMMUNITY): Payer: Self-pay | Admitting: *Deleted

## 2012-05-14 ENCOUNTER — Ambulatory Visit (HOSPITAL_COMMUNITY): Payer: Medicare Other | Admitting: Anesthesiology

## 2012-05-14 ENCOUNTER — Ambulatory Visit (HOSPITAL_COMMUNITY)
Admission: RE | Admit: 2012-05-14 | Discharge: 2012-05-14 | Disposition: A | Discharge status: Home / Self Care | Payer: Medicare Other | Source: Ambulatory Visit | Attending: Surgery | Admitting: Surgery

## 2012-05-14 DIAGNOSIS — Z992 Dependence on renal dialysis: Secondary | ICD-10-CM | POA: Diagnosis not present

## 2012-05-14 DIAGNOSIS — Z841 Family history of disorders of kidney and ureter: Secondary | ICD-10-CM | POA: Insufficient documentation

## 2012-05-14 DIAGNOSIS — E669 Obesity, unspecified: Secondary | ICD-10-CM | POA: Insufficient documentation

## 2012-05-14 DIAGNOSIS — S68118A Complete traumatic metacarpophalangeal amputation of other finger, initial encounter: Secondary | ICD-10-CM | POA: Insufficient documentation

## 2012-05-14 DIAGNOSIS — Z6841 Body Mass Index (BMI) 40.0 and over, adult: Secondary | ICD-10-CM | POA: Insufficient documentation

## 2012-05-14 DIAGNOSIS — I120 Hypertensive chronic kidney disease with stage 5 chronic kidney disease or end stage renal disease: Secondary | ICD-10-CM | POA: Diagnosis not present

## 2012-05-14 DIAGNOSIS — Z86711 Personal history of pulmonary embolism: Secondary | ICD-10-CM | POA: Diagnosis not present

## 2012-05-14 DIAGNOSIS — T82898A Other specified complication of vascular prosthetic devices, implants and grafts, initial encounter: Secondary | ICD-10-CM

## 2012-05-14 DIAGNOSIS — N186 End stage renal disease: Secondary | ICD-10-CM | POA: Diagnosis not present

## 2012-05-14 DIAGNOSIS — Z7901 Long term (current) use of anticoagulants: Secondary | ICD-10-CM | POA: Insufficient documentation

## 2012-05-14 DIAGNOSIS — Z809 Family history of malignant neoplasm, unspecified: Secondary | ICD-10-CM | POA: Insufficient documentation

## 2012-05-14 HISTORY — PX: AV FISTULA PLACEMENT: SHX1204

## 2012-05-14 HISTORY — DX: Shortness of breath: R06.02

## 2012-05-14 LAB — PROTIME-INR: Prothrombin Time: 15.1 seconds (ref 11.6–15.2)

## 2012-05-14 LAB — POCT I-STAT 4, (NA,K, GLUC, HGB,HCT)
Glucose, Bld: 104 mg/dL — ABNORMAL HIGH (ref 70–99)
HCT: 35 % — ABNORMAL LOW (ref 36.0–46.0)
Hemoglobin: 11.9 g/dL — ABNORMAL LOW (ref 12.0–15.0)
Potassium: 4.7 mEq/L (ref 3.5–5.1)
Sodium: 134 mEq/L — ABNORMAL LOW (ref 135–145)

## 2012-05-14 LAB — APTT: aPTT: 43 seconds — ABNORMAL HIGH (ref 24–37)

## 2012-05-14 LAB — HCG, SERUM, QUALITATIVE: Preg, Serum: NEGATIVE

## 2012-05-14 SURGERY — INSERTION OF ARTERIOVENOUS (AV) GORE-TEX GRAFT ARM
Anesthesia: Monitor Anesthesia Care | Site: Arm Lower | Laterality: Right | Wound class: Clean

## 2012-05-14 MED ORDER — MUPIROCIN 2 % EX OINT: TOPICAL_OINTMENT | Freq: Two times a day (BID) | CUTANEOUS | Status: DC

## 2012-05-14 MED ORDER — LIDOCAINE-EPINEPHRINE (PF) 1 %-1:200000 IJ SOLN
INTRAMUSCULAR | Status: AC
  Filled 2012-05-14: qty 10

## 2012-05-14 MED ORDER — HYDROCODONE-ACETAMINOPHEN 5-325 MG PO TABS: 1.0000 | ORAL_TABLET | ORAL | Status: DC | PRN

## 2012-05-14 MED ORDER — SODIUM CHLORIDE 0.9 % IR SOLN
Status: DC | PRN
  Administered 2012-05-14 (×2)

## 2012-05-14 MED ORDER — HYDROMORPHONE HCL PF 1 MG/ML IJ SOLN
INTRAMUSCULAR | Status: AC
  Filled 2012-05-14: qty 1

## 2012-05-14 MED ORDER — PROTAMINE SULFATE 10 MG/ML IV SOLN
INTRAVENOUS | Status: DC | PRN
  Administered 2012-05-14 (×5): 10 mg via INTRAVENOUS

## 2012-05-14 MED ORDER — LIDOCAINE-EPINEPHRINE (PF) 1 %-1:200000 IJ SOLN
INTRAMUSCULAR | Status: DC | PRN
  Administered 2012-05-14: 10 mL via INTRADERMAL
  Administered 2012-05-14 (×2): 30 mL via INTRADERMAL

## 2012-05-14 MED ORDER — HEPARIN SODIUM (PORCINE) 1000 UNIT/ML IJ SOLN
INTRAMUSCULAR | Status: DC | PRN
  Administered 2012-05-14: 6000 [IU] via INTRAVENOUS

## 2012-05-14 MED ORDER — ONDANSETRON HCL 4 MG/2ML IJ SOLN: 4.0000 mg | Freq: Once | INTRAMUSCULAR | Status: DC | PRN

## 2012-05-14 MED ORDER — PHENYLEPHRINE HCL 10 MG/ML IJ SOLN
INTRAMUSCULAR | Status: DC | PRN
  Administered 2012-05-14 (×8): 80 ug via INTRAVENOUS

## 2012-05-14 MED ORDER — MIDAZOLAM HCL 5 MG/5ML IJ SOLN
INTRAMUSCULAR | Status: DC | PRN
  Administered 2012-05-14: 2 mg via INTRAVENOUS

## 2012-05-14 MED ORDER — FENTANYL CITRATE 0.05 MG/ML IJ SOLN
INTRAMUSCULAR | Status: DC | PRN
  Administered 2012-05-14 (×3): 25 ug via INTRAVENOUS
  Administered 2012-05-14 (×2): 50 ug via INTRAVENOUS
  Administered 2012-05-14 (×2): 25 ug via INTRAVENOUS

## 2012-05-14 MED ORDER — MUPIROCIN 2 % EX OINT
TOPICAL_OINTMENT | CUTANEOUS | Status: AC
  Filled 2012-05-14: qty 22

## 2012-05-14 MED ORDER — PROPOFOL INFUSION 10 MG/ML OPTIME
INTRAVENOUS | Status: DC | PRN
  Administered 2012-05-14: 25 ug/kg/min via INTRAVENOUS

## 2012-05-14 MED ORDER — PROPOFOL 10 MG/ML IV BOLUS
INTRAVENOUS | Status: DC | PRN
  Administered 2012-05-14 (×2): 20 mg via INTRAVENOUS
  Administered 2012-05-14: 10 mg via INTRAVENOUS
  Administered 2012-05-14: 20 mg via INTRAVENOUS

## 2012-05-14 MED ORDER — 0.9 % SODIUM CHLORIDE (POUR BTL) OPTIME
TOPICAL | Status: DC | PRN
  Administered 2012-05-14: 1000 mL
  Administered 2012-05-14: 2000 mL

## 2012-05-14 MED ORDER — HYDROMORPHONE HCL PF 1 MG/ML IJ SOLN
0.2500 mg | INTRAMUSCULAR | Status: DC | PRN
  Administered 2012-05-14: 0.25 mg via INTRAVENOUS

## 2012-05-14 MED ORDER — SODIUM CHLORIDE 0.9 % IV SOLN
INTRAVENOUS | Status: DC | PRN
  Administered 2012-05-14 (×2): via INTRAVENOUS

## 2012-05-14 MED ORDER — ONDANSETRON HCL 4 MG/2ML IJ SOLN
INTRAMUSCULAR | Status: DC | PRN
  Administered 2012-05-14: 4 mg via INTRAVENOUS

## 2012-05-14 SURGICAL SUPPLY — 41 items
ADH SKN CLS APL DERMABOND .7 (GAUZE/BANDAGES/DRESSINGS) ×1
CANISTER SUCTION 2500CC (MISCELLANEOUS) ×2 IMPLANT
CLIP TI MEDIUM 6 (CLIP) ×2 IMPLANT
CLIP TI WIDE RED SMALL 6 (CLIP) ×2 IMPLANT
CLOTH BEACON ORANGE TIMEOUT ST (SAFETY) ×2 IMPLANT
COVER SURGICAL LIGHT HANDLE (MISCELLANEOUS) ×3 IMPLANT
COVER TRANSDUCER ULTRASND GEL (DRAPE) ×1 IMPLANT
DERMABOND ADVANCED (GAUZE/BANDAGES/DRESSINGS) ×1
DERMABOND ADVANCED .7 DNX12 (GAUZE/BANDAGES/DRESSINGS) ×1 IMPLANT
ELECT REM PT RETURN 9FT ADLT (ELECTROSURGICAL) ×2
ELECTRODE REM PT RTRN 9FT ADLT (ELECTROSURGICAL) ×1 IMPLANT
GEL ULTRASOUND 20GR AQUASONIC (MISCELLANEOUS) IMPLANT
GLOVE BIOGEL PI IND STRL 7.5 (GLOVE) ×1 IMPLANT
GLOVE BIOGEL PI INDICATOR 7.5 (GLOVE) ×3
GLOVE SS BIOGEL STRL SZ 7 (GLOVE) IMPLANT
GLOVE SUPERSENSE BIOGEL SZ 7 (GLOVE) ×1
GLOVE SURG SS PI 7.0 STRL IVOR (GLOVE) ×1 IMPLANT
GLOVE SURG SS PI 7.5 STRL IVOR (GLOVE) ×3 IMPLANT
GOWN DECONTAM XXLG NS DISP (GOWN DISPOSABLE) ×1 IMPLANT
GOWN PREVENTION PLUS XLARGE (GOWN DISPOSABLE) ×1 IMPLANT
GOWN PREVENTION PLUS XXLARGE (GOWN DISPOSABLE) ×2 IMPLANT
GOWN STRL NON-REIN LRG LVL3 (GOWN DISPOSABLE) ×4 IMPLANT
GOWN STRL REIN XL XLG (GOWN DISPOSABLE) ×1 IMPLANT
GRAFT COLLAGEN VASCULAR 6X40 (Vascular Products) ×1 IMPLANT
HEMOSTAT SNOW SURGICEL 2X4 (HEMOSTASIS) ×2 IMPLANT
HEMOSTAT SURGICEL 2X14 (HEMOSTASIS) IMPLANT
KIT BASIN OR (CUSTOM PROCEDURE TRAY) ×2 IMPLANT
KIT ROOM TURNOVER OR (KITS) ×2 IMPLANT
NS IRRIG 1000ML POUR BTL (IV SOLUTION) ×4 IMPLANT
PACK CV ACCESS (CUSTOM PROCEDURE TRAY) ×2 IMPLANT
PAD ARMBOARD 7.5X6 YLW CONV (MISCELLANEOUS) ×4 IMPLANT
SUT PROLENE 6 0 BV (SUTURE) ×4 IMPLANT
SUT SILK 2 0 SH (SUTURE) ×2 IMPLANT
SUT VIC AB 3-0 SH 27 (SUTURE) ×4
SUT VIC AB 3-0 SH 27X BRD (SUTURE) ×2 IMPLANT
SUT VICRYL 4-0 PS2 18IN ABS (SUTURE) ×1 IMPLANT
SYR BULB IRRIGATION 50ML (SYRINGE) ×1 IMPLANT
TOWEL OR 17X24 6PK STRL BLUE (TOWEL DISPOSABLE) ×2 IMPLANT
TOWEL OR 17X26 10 PK STRL BLUE (TOWEL DISPOSABLE) ×2 IMPLANT
UNDERPAD 30X30 INCONTINENT (UNDERPADS AND DIAPERS) ×2 IMPLANT
WATER STERILE IRR 1000ML POUR (IV SOLUTION) ×2 IMPLANT

## 2012-05-14 NOTE — Transfer of Care (Signed)
Immediate Anesthesia Transfer of Care Note  Patient: Kristine Rios  Procedure(s) Performed: Procedure(s) (LRB) with comments: INSERTION OF ARTERIOVENOUS (AV) GORE-TEX GRAFT ARM (Right) - Insertion of right forearm vs upper arm arteriovenous "bovine" graft   Patient Location: PACU  Anesthesia Type:MAC  Level of Consciousness: awake, alert  and oriented  Airway & Oxygen Therapy: Patient Spontanous Breathing  Post-op Assessment: Report given to PACU RN, Post -op Vital signs reviewed and stable and Patient moving all extremities X 4  Post vital signs: Reviewed and stable  Complications: No apparent anesthesia complications

## 2012-05-14 NOTE — Anesthesia Postprocedure Evaluation (Signed)
Anesthesia Post Note  Patient: Kristine Rios  Procedure(s) Performed: Procedure(s) (LRB): INSERTION OF ARTERIOVENOUS (AV) GORE-TEX GRAFT ARM (Right)  Anesthesia type: MAC  Patient location: PACU  Post pain: Pain level controlled and Adequate analgesia  Post assessment: Post-op Vital signs reviewed, Patient's Cardiovascular Status Stable and Respiratory Function Stable  Last Vitals:  Filed Vitals:   05/14/12 1700  BP: 109/64  Pulse:   Temp: 36.3 C  Resp:     Post vital signs: Reviewed and stable  Level of consciousness: awake, alert  and oriented  Complications: No apparent anesthesia complications
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
## 2012-05-14 NOTE — Progress Notes (Signed)
Call to dr. Donley Redder sign out pt

## 2012-05-14 NOTE — Op Note (Signed)
[facility] of [place]  Patient name: Kristine Rios MRN: [id number] DOB: [birth year] Sex: female  05/14/2012 Pre-operative Diagnosis: ESRD Post-operative diagnosis:  Same Surgeon:  Jorge Ny Assistants:  Narda Amber Procedure:   Right forearm graft with bovine carotid Anesthesia:  MAC Blood Loss:  See anesthesia record Specimens:  none  Findings:  Arterial anastamosis was to the distal brachial or possibly proximal radial at the takeoff of the interosseous artery.  There was good arterial inflow.  The venous anastamosis was to a 2.30mm brachial vein which was medial.  This was at a branch point and end to side.  Indications:  The patient has had several attempts at HD access which has failed.  This operation was delayed due to PE, for which she is on anticoagulation for.  Her coumadin was bridged with Lovenox for the operation.  I am using bovine carotid as she had a severe gortex reaction with her left FA AVGG  Procedure:  The patient was identified in the holding area and taken to [facility] OR ROOM 11  The patient was then placed supine on the table. MAC anesthesia was administered.  The patient was prepped and draped in the usual sterile fashion.  A time out was called and antibiotics were administered.  A vertical incision was made at the anticubital crease.  The artery and vein were exposed.  I evaluated the vein with u/s prior to the incision adn the brachial vein appeared to be an adequate target, about 3mm in diameter.  She had an excellent arterial pulse.  I dissected out what is likely the radial artery which was about 3mm in diameter.  A large branch was identified, which was likely the interosseous.  This could have been the distal brachial artery.  The vein on the medial side was adequate, about 2.5 mm.  1% lidocaine was utilized.  A counter incision was made in the distal forearm.  A tunnel was created.  The bovine carotid graft was prepared on the backtable.  It was  flushed to make sure there were no holes.  It was marked with an ink pen for orientation.  The patient was fully heparanized.  The graft was brought through the tunnel.  The artery was occluded and then opened with a #11 blade.  This was extended longitudinally with Potts scissors.  The graft was bevelled.  An end to side anastamosis was created with 6-0 prolene.  The clamps were released.  There was excellent inflow.  The graft was flushed with saline and occluded.  The vein was then occluded.  It was opened longitudinally.  The graft was cut to length and then beveled to fit the size of the veinotomy.  An end to side anastamosis was created with 6-0 prolene.  Prior to completion, the appropriate flush maneuvers were performed and the anastamosis was completed.  There was an excellent pulse and thrill within the graft.  She also had a palpable radial pulse.  50mg  of protamine was given.  After hemostasis was achieved, the incisions were closed with 3-0 vicryl.  Dermabond was applied to the skin.   Disposition:  To PACU in stable condition.   Juleen China, M.D. [facility] [place] Office: [phone] Pager:  [phone]

## 2012-05-14 NOTE — Anesthesia Preprocedure Evaluation (Signed)
Anesthesia Evaluation  Patient identified by MRN, date of birth, ID band Patient awake    Reviewed: Allergy & Precautions, H&P , NPO status , Patient's Chart, lab work & pertinent test results  Airway Mallampati: I TM Distance: >3 FB     Dental   Pulmonary shortness of breath,          Cardiovascular hypertension, Rhythm:regular Rate:Normal     Neuro/Psych    GI/Hepatic   Endo/Other    Renal/GU ESRF and DialysisRenal disease     Musculoskeletal   Abdominal   Peds  Hematology   Anesthesia Other Findings   Reproductive/Obstetrics                           Anesthesia Physical Anesthesia Plan  ASA: III  Anesthesia Plan: MAC and General   Post-op Pain Management:    Induction: Intravenous  Airway Management Planned: Mask and LMA  Additional Equipment:   Intra-op Plan:   Post-operative Plan: Extubation in OR  Informed Consent: I have reviewed the patients History and Physical, chart, labs and discussed the procedure including the risks, benefits and alternatives for the proposed anesthesia with the patient or authorized representative who has indicated his/her understanding and acceptance.     Plan Discussed with: CRNA, Anesthesiologist and Surgeon  Anesthesia Plan Comments:         Anesthesia Quick Evaluation

## 2012-05-14 NOTE — Anesthesia Procedure Notes (Signed)
Procedure Name: MAC Date/Time: 05/14/2012 2:00 PM Performed by: Sherie Don Pre-anesthesia Checklist: Patient identified, Emergency Drugs available, Suction available, Patient being monitored and Timeout performed Patient Re-evaluated:Patient Re-evaluated prior to inductionOxygen Delivery Method: Simple face mask Preoxygenation: Pre-oxygenation with 100% oxygen

## 2012-05-15 ENCOUNTER — Encounter (HOSPITAL_COMMUNITY): Payer: Self-pay | Admitting: Surgery

## 2012-05-15 ENCOUNTER — Telehealth: Payer: Self-pay

## 2012-05-15 NOTE — Telephone Encounter (Signed)
Phone call from pt. To report "swelling of right arm from wrist to elbow."  Denies any swelling of (R) hand/fingers.  States the last time she had a graft placed she had a lot of swelling, and that Dr. Myra Gianotti thought she might have been allergic to the graft material.  States she has some redness on one side of arm, but denies any hives.  States that the swelling worsened overnight. Estimates that the right arm is swollen approx. Twice the size of the left arm.  Able to use her right hand, but states "it hurts to use it, because my arm hurts."   Encouraged pt. to elevate her right arm above the level of the heart.  Encouraged to take her pain medication as prescribed, and continue to monitor her right arm.  Advised if her symptoms worsened, to go to the ER this weekend.  Verb. Understanding.

## 2012-05-16 DIAGNOSIS — N186 End stage renal disease: Secondary | ICD-10-CM | POA: Diagnosis not present

## 2012-05-16 DIAGNOSIS — N2581 Secondary hyperparathyroidism of renal origin: Secondary | ICD-10-CM | POA: Diagnosis not present

## 2012-05-16 DIAGNOSIS — N039 Chronic nephritic syndrome with unspecified morphologic changes: Secondary | ICD-10-CM | POA: Diagnosis not present

## 2012-05-16 DIAGNOSIS — D509 Iron deficiency anemia, unspecified: Secondary | ICD-10-CM | POA: Diagnosis not present

## 2012-05-16 DIAGNOSIS — Z992 Dependence on renal dialysis: Secondary | ICD-10-CM | POA: Diagnosis not present

## 2012-05-16 DIAGNOSIS — R6883 Chills (without fever): Secondary | ICD-10-CM | POA: Diagnosis not present

## 2012-05-18 DIAGNOSIS — D509 Iron deficiency anemia, unspecified: Secondary | ICD-10-CM | POA: Diagnosis not present

## 2012-05-18 DIAGNOSIS — R6883 Chills (without fever): Secondary | ICD-10-CM | POA: Diagnosis not present

## 2012-05-18 DIAGNOSIS — Z992 Dependence on renal dialysis: Secondary | ICD-10-CM | POA: Diagnosis not present

## 2012-05-18 DIAGNOSIS — N2581 Secondary hyperparathyroidism of renal origin: Secondary | ICD-10-CM | POA: Diagnosis not present

## 2012-05-18 DIAGNOSIS — D631 Anemia in chronic kidney disease: Secondary | ICD-10-CM | POA: Diagnosis not present

## 2012-05-18 DIAGNOSIS — N186 End stage renal disease: Secondary | ICD-10-CM | POA: Diagnosis not present

## 2012-05-20 DIAGNOSIS — N039 Chronic nephritic syndrome with unspecified morphologic changes: Secondary | ICD-10-CM | POA: Diagnosis not present

## 2012-05-20 DIAGNOSIS — R6883 Chills (without fever): Secondary | ICD-10-CM | POA: Diagnosis not present

## 2012-05-20 DIAGNOSIS — Z992 Dependence on renal dialysis: Secondary | ICD-10-CM | POA: Diagnosis not present

## 2012-05-20 DIAGNOSIS — N186 End stage renal disease: Secondary | ICD-10-CM | POA: Diagnosis not present

## 2012-05-20 DIAGNOSIS — D509 Iron deficiency anemia, unspecified: Secondary | ICD-10-CM | POA: Diagnosis not present

## 2012-05-20 DIAGNOSIS — I2699 Other pulmonary embolism without acute cor pulmonale: Secondary | ICD-10-CM | POA: Diagnosis not present

## 2012-05-20 DIAGNOSIS — N2581 Secondary hyperparathyroidism of renal origin: Secondary | ICD-10-CM | POA: Diagnosis not present

## 2012-05-22 DIAGNOSIS — R6883 Chills (without fever): Secondary | ICD-10-CM | POA: Diagnosis not present

## 2012-05-22 DIAGNOSIS — N186 End stage renal disease: Secondary | ICD-10-CM | POA: Diagnosis not present

## 2012-05-22 DIAGNOSIS — Z992 Dependence on renal dialysis: Secondary | ICD-10-CM | POA: Diagnosis not present

## 2012-05-22 DIAGNOSIS — N2581 Secondary hyperparathyroidism of renal origin: Secondary | ICD-10-CM | POA: Diagnosis not present

## 2012-05-22 DIAGNOSIS — N039 Chronic nephritic syndrome with unspecified morphologic changes: Secondary | ICD-10-CM | POA: Diagnosis not present

## 2012-05-22 DIAGNOSIS — D509 Iron deficiency anemia, unspecified: Secondary | ICD-10-CM | POA: Diagnosis not present

## 2012-05-25 ENCOUNTER — Ambulatory Visit: Payer: Medicare Other | Admitting: Surgery

## 2012-05-25 DIAGNOSIS — R6883 Chills (without fever): Secondary | ICD-10-CM | POA: Diagnosis not present

## 2012-05-25 DIAGNOSIS — D509 Iron deficiency anemia, unspecified: Secondary | ICD-10-CM | POA: Diagnosis not present

## 2012-05-25 DIAGNOSIS — D631 Anemia in chronic kidney disease: Secondary | ICD-10-CM | POA: Diagnosis not present

## 2012-05-25 DIAGNOSIS — N186 End stage renal disease: Secondary | ICD-10-CM | POA: Diagnosis not present

## 2012-05-25 DIAGNOSIS — N2581 Secondary hyperparathyroidism of renal origin: Secondary | ICD-10-CM | POA: Diagnosis not present

## 2012-05-25 DIAGNOSIS — Z992 Dependence on renal dialysis: Secondary | ICD-10-CM | POA: Diagnosis not present

## 2012-05-27 DIAGNOSIS — N186 End stage renal disease: Secondary | ICD-10-CM | POA: Diagnosis not present

## 2012-05-27 DIAGNOSIS — D509 Iron deficiency anemia, unspecified: Secondary | ICD-10-CM | POA: Diagnosis not present

## 2012-05-27 DIAGNOSIS — Z992 Dependence on renal dialysis: Secondary | ICD-10-CM | POA: Diagnosis not present

## 2012-05-27 DIAGNOSIS — R6883 Chills (without fever): Secondary | ICD-10-CM | POA: Diagnosis not present

## 2012-05-27 DIAGNOSIS — D631 Anemia in chronic kidney disease: Secondary | ICD-10-CM | POA: Diagnosis not present

## 2012-05-27 DIAGNOSIS — N2581 Secondary hyperparathyroidism of renal origin: Secondary | ICD-10-CM | POA: Diagnosis not present

## 2012-05-27 DIAGNOSIS — I2699 Other pulmonary embolism without acute cor pulmonale: Secondary | ICD-10-CM | POA: Diagnosis not present

## 2012-05-30 DIAGNOSIS — N186 End stage renal disease: Secondary | ICD-10-CM | POA: Diagnosis not present

## 2012-05-30 DIAGNOSIS — R6883 Chills (without fever): Secondary | ICD-10-CM | POA: Diagnosis not present

## 2012-05-30 DIAGNOSIS — D631 Anemia in chronic kidney disease: Secondary | ICD-10-CM | POA: Diagnosis not present

## 2012-05-30 DIAGNOSIS — Z992 Dependence on renal dialysis: Secondary | ICD-10-CM | POA: Diagnosis not present

## 2012-05-30 DIAGNOSIS — N2581 Secondary hyperparathyroidism of renal origin: Secondary | ICD-10-CM | POA: Diagnosis not present

## 2012-05-30 DIAGNOSIS — D509 Iron deficiency anemia, unspecified: Secondary | ICD-10-CM | POA: Diagnosis not present

## 2012-06-01 DIAGNOSIS — Z992 Dependence on renal dialysis: Secondary | ICD-10-CM | POA: Diagnosis not present

## 2012-06-01 DIAGNOSIS — N186 End stage renal disease: Secondary | ICD-10-CM | POA: Diagnosis not present

## 2012-06-01 DIAGNOSIS — D509 Iron deficiency anemia, unspecified: Secondary | ICD-10-CM | POA: Diagnosis not present

## 2012-06-01 DIAGNOSIS — R6883 Chills (without fever): Secondary | ICD-10-CM | POA: Diagnosis not present

## 2012-06-01 DIAGNOSIS — D631 Anemia in chronic kidney disease: Secondary | ICD-10-CM | POA: Diagnosis not present

## 2012-06-01 DIAGNOSIS — N2581 Secondary hyperparathyroidism of renal origin: Secondary | ICD-10-CM | POA: Diagnosis not present

## 2012-06-03 DIAGNOSIS — R6883 Chills (without fever): Secondary | ICD-10-CM | POA: Diagnosis not present

## 2012-06-03 DIAGNOSIS — D509 Iron deficiency anemia, unspecified: Secondary | ICD-10-CM | POA: Diagnosis not present

## 2012-06-03 DIAGNOSIS — Z992 Dependence on renal dialysis: Secondary | ICD-10-CM | POA: Diagnosis not present

## 2012-06-03 DIAGNOSIS — N2581 Secondary hyperparathyroidism of renal origin: Secondary | ICD-10-CM | POA: Diagnosis not present

## 2012-06-03 DIAGNOSIS — N186 End stage renal disease: Secondary | ICD-10-CM | POA: Diagnosis not present

## 2012-06-03 DIAGNOSIS — N039 Chronic nephritic syndrome with unspecified morphologic changes: Secondary | ICD-10-CM | POA: Diagnosis not present

## 2012-06-05 DIAGNOSIS — N186 End stage renal disease: Secondary | ICD-10-CM | POA: Diagnosis not present

## 2012-06-05 DIAGNOSIS — N039 Chronic nephritic syndrome with unspecified morphologic changes: Secondary | ICD-10-CM | POA: Diagnosis not present

## 2012-06-05 DIAGNOSIS — D509 Iron deficiency anemia, unspecified: Secondary | ICD-10-CM | POA: Diagnosis not present

## 2012-06-05 DIAGNOSIS — R6883 Chills (without fever): Secondary | ICD-10-CM | POA: Diagnosis not present

## 2012-06-05 DIAGNOSIS — I749 Embolism and thrombosis of unspecified artery: Secondary | ICD-10-CM | POA: Diagnosis not present

## 2012-06-05 DIAGNOSIS — N2581 Secondary hyperparathyroidism of renal origin: Secondary | ICD-10-CM | POA: Diagnosis not present

## 2012-06-05 DIAGNOSIS — Z992 Dependence on renal dialysis: Secondary | ICD-10-CM | POA: Diagnosis not present

## 2012-06-08 DIAGNOSIS — D631 Anemia in chronic kidney disease: Secondary | ICD-10-CM | POA: Diagnosis not present

## 2012-06-08 DIAGNOSIS — E8779 Other fluid overload: Secondary | ICD-10-CM | POA: Diagnosis not present

## 2012-06-08 DIAGNOSIS — D509 Iron deficiency anemia, unspecified: Secondary | ICD-10-CM | POA: Diagnosis not present

## 2012-06-08 DIAGNOSIS — N2581 Secondary hyperparathyroidism of renal origin: Secondary | ICD-10-CM | POA: Diagnosis not present

## 2012-06-08 DIAGNOSIS — B9689 Other specified bacterial agents as the cause of diseases classified elsewhere: Secondary | ICD-10-CM | POA: Diagnosis not present

## 2012-06-08 DIAGNOSIS — N186 End stage renal disease: Secondary | ICD-10-CM | POA: Diagnosis not present

## 2012-06-19 ENCOUNTER — Other Ambulatory Visit: Payer: Self-pay | Admitting: *Deleted

## 2012-06-25 ENCOUNTER — Ambulatory Visit (HOSPITAL_COMMUNITY)
Admission: RE | Admit: 2012-06-25 | Discharge: 2012-06-25 | Disposition: A | Discharge status: Home / Self Care | Payer: Medicare Other | Source: Ambulatory Visit | Attending: Vascular Surgery | Admitting: Vascular Surgery

## 2012-06-25 DIAGNOSIS — N186 End stage renal disease: Secondary | ICD-10-CM | POA: Insufficient documentation

## 2012-06-25 DIAGNOSIS — Z7901 Long term (current) use of anticoagulants: Secondary | ICD-10-CM | POA: Insufficient documentation

## 2012-06-25 DIAGNOSIS — Z452 Encounter for adjustment and management of vascular access device: Secondary | ICD-10-CM | POA: Diagnosis not present

## 2012-06-25 DIAGNOSIS — Z86711 Personal history of pulmonary embolism: Secondary | ICD-10-CM | POA: Insufficient documentation

## 2012-06-25 LAB — PROTIME-INR
INR: 1.42 (ref 0.00–1.49)
Prothrombin Time: 17 seconds — ABNORMAL HIGH (ref 11.6–15.2)

## 2012-06-25 NOTE — Progress Notes (Signed)
Patient states that she is leaving without having catheter removed. She states that nobody at the office told her to stop taking her Coumadin prior to procedure. INR today 1.42 per Valeria in lab. Patient states concerns about her INR level. States that she can't stay any longer today and did not realize she would be here this long. Lianne Cure, PA in to see patient.

## 2012-06-26 ENCOUNTER — Other Ambulatory Visit: Payer: Self-pay

## 2012-07-02 ENCOUNTER — Ambulatory Visit (HOSPITAL_COMMUNITY)
Admission: RE | Admit: 2012-07-02 | Discharge: 2012-07-02 | Disposition: A | Discharge status: Home / Self Care | Payer: Medicare Other | Source: Ambulatory Visit | Attending: Surgery | Admitting: Surgery

## 2012-07-02 DIAGNOSIS — Z79899 Other long term (current) drug therapy: Secondary | ICD-10-CM | POA: Insufficient documentation

## 2012-07-02 DIAGNOSIS — I120 Hypertensive chronic kidney disease with stage 5 chronic kidney disease or end stage renal disease: Secondary | ICD-10-CM | POA: Insufficient documentation

## 2012-07-02 DIAGNOSIS — N186 End stage renal disease: Secondary | ICD-10-CM | POA: Diagnosis not present

## 2012-07-02 DIAGNOSIS — Z4902 Encounter for fitting and adjustment of peritoneal dialysis catheter: Secondary | ICD-10-CM | POA: Diagnosis not present

## 2012-07-02 DIAGNOSIS — Z86711 Personal history of pulmonary embolism: Secondary | ICD-10-CM | POA: Diagnosis not present
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
## 2012-07-06 DIAGNOSIS — D631 Anemia in chronic kidney disease: Secondary | ICD-10-CM | POA: Diagnosis not present

## 2012-07-06 DIAGNOSIS — N186 End stage renal disease: Secondary | ICD-10-CM | POA: Diagnosis not present

## 2012-07-06 DIAGNOSIS — D509 Iron deficiency anemia, unspecified: Secondary | ICD-10-CM | POA: Diagnosis not present

## 2012-07-06 DIAGNOSIS — N2581 Secondary hyperparathyroidism of renal origin: Secondary | ICD-10-CM | POA: Diagnosis not present

## 2012-07-06 LAB — CATH TIP CULTURE

## 2012-07-30 DIAGNOSIS — I2699 Other pulmonary embolism without acute cor pulmonale: Secondary | ICD-10-CM | POA: Diagnosis not present

## 2012-08-01 DIAGNOSIS — I2699 Other pulmonary embolism without acute cor pulmonale: Secondary | ICD-10-CM | POA: Diagnosis not present

## 2012-08-03 DIAGNOSIS — N186 End stage renal disease: Secondary | ICD-10-CM | POA: Diagnosis not present

## 2012-08-03 DIAGNOSIS — I2699 Other pulmonary embolism without acute cor pulmonale: Secondary | ICD-10-CM | POA: Diagnosis not present

## 2012-08-05 DIAGNOSIS — N2581 Secondary hyperparathyroidism of renal origin: Secondary | ICD-10-CM | POA: Diagnosis not present

## 2012-08-05 DIAGNOSIS — N186 End stage renal disease: Secondary | ICD-10-CM | POA: Diagnosis not present

## 2012-08-05 DIAGNOSIS — D509 Iron deficiency anemia, unspecified: Secondary | ICD-10-CM | POA: Diagnosis not present

## 2012-08-05 DIAGNOSIS — D631 Anemia in chronic kidney disease: Secondary | ICD-10-CM | POA: Diagnosis not present

## 2012-08-07 DIAGNOSIS — I2699 Other pulmonary embolism without acute cor pulmonale: Secondary | ICD-10-CM | POA: Diagnosis not present

## 2012-08-12 ENCOUNTER — Other Ambulatory Visit: Payer: Self-pay | Admitting: *Deleted

## 2012-08-12 DIAGNOSIS — I2699 Other pulmonary embolism without acute cor pulmonale: Secondary | ICD-10-CM | POA: Diagnosis not present

## 2012-08-12 DIAGNOSIS — T82598A Other mechanical complication of other cardiac and vascular devices and implants, initial encounter: Secondary | ICD-10-CM

## 2012-08-17 DIAGNOSIS — I2699 Other pulmonary embolism without acute cor pulmonale: Secondary | ICD-10-CM | POA: Diagnosis not present

## 2012-08-26 DIAGNOSIS — I2699 Other pulmonary embolism without acute cor pulmonale: Secondary | ICD-10-CM | POA: Diagnosis not present

## 2012-09-02 DIAGNOSIS — N186 End stage renal disease: Secondary | ICD-10-CM | POA: Diagnosis not present

## 2012-09-03 DIAGNOSIS — D631 Anemia in chronic kidney disease: Secondary | ICD-10-CM | POA: Diagnosis not present

## 2012-09-03 DIAGNOSIS — N2581 Secondary hyperparathyroidism of renal origin: Secondary | ICD-10-CM | POA: Diagnosis not present

## 2012-09-03 DIAGNOSIS — N186 End stage renal disease: Secondary | ICD-10-CM | POA: Diagnosis not present

## 2012-09-03 DIAGNOSIS — D509 Iron deficiency anemia, unspecified: Secondary | ICD-10-CM | POA: Diagnosis not present

## 2012-09-04 ENCOUNTER — Encounter: Payer: Self-pay | Admitting: Surgery

## 2012-09-07 ENCOUNTER — Ambulatory Visit: Payer: Medicare Other | Admitting: Surgery

## 2012-09-16 DIAGNOSIS — I2699 Other pulmonary embolism without acute cor pulmonale: Secondary | ICD-10-CM | POA: Diagnosis not present

## 2012-10-03 DIAGNOSIS — N186 End stage renal disease: Secondary | ICD-10-CM | POA: Diagnosis not present

## 2012-10-05 DIAGNOSIS — N2581 Secondary hyperparathyroidism of renal origin: Secondary | ICD-10-CM | POA: Diagnosis not present

## 2012-10-05 DIAGNOSIS — N186 End stage renal disease: Secondary | ICD-10-CM | POA: Diagnosis not present

## 2012-10-05 DIAGNOSIS — D631 Anemia in chronic kidney disease: Secondary | ICD-10-CM | POA: Diagnosis not present

## 2012-10-05 DIAGNOSIS — D509 Iron deficiency anemia, unspecified: Secondary | ICD-10-CM | POA: Diagnosis not present

## 2012-10-08 ENCOUNTER — Ambulatory Visit: Payer: Medicare Other | Admitting: Internal Medicine

## 2012-10-14 DIAGNOSIS — I2699 Other pulmonary embolism without acute cor pulmonale: Secondary | ICD-10-CM | POA: Diagnosis not present

## 2012-10-22 DIAGNOSIS — I2699 Other pulmonary embolism without acute cor pulmonale: Secondary | ICD-10-CM | POA: Diagnosis not present

## 2012-10-29 ENCOUNTER — Ambulatory Visit: Payer: Medicare Other | Admitting: Pulmonary Disease

## 2012-10-30 DIAGNOSIS — D689 Coagulation defect, unspecified: Secondary | ICD-10-CM | POA: Diagnosis not present

## 2012-11-02 DIAGNOSIS — N186 End stage renal disease: Secondary | ICD-10-CM | POA: Diagnosis not present

## 2012-11-04 DIAGNOSIS — N2581 Secondary hyperparathyroidism of renal origin: Secondary | ICD-10-CM | POA: Diagnosis not present

## 2012-11-04 DIAGNOSIS — D631 Anemia in chronic kidney disease: Secondary | ICD-10-CM | POA: Diagnosis not present

## 2012-11-04 DIAGNOSIS — N186 End stage renal disease: Secondary | ICD-10-CM | POA: Diagnosis not present

## 2012-11-04 DIAGNOSIS — D509 Iron deficiency anemia, unspecified: Secondary | ICD-10-CM | POA: Diagnosis not present

## 2012-11-11 DIAGNOSIS — I2699 Other pulmonary embolism without acute cor pulmonale: Secondary | ICD-10-CM | POA: Diagnosis not present

## 2012-11-13 DIAGNOSIS — I2699 Other pulmonary embolism without acute cor pulmonale: Secondary | ICD-10-CM | POA: Diagnosis not present

## 2012-11-18 DIAGNOSIS — I2699 Other pulmonary embolism without acute cor pulmonale: Secondary | ICD-10-CM | POA: Diagnosis not present

## 2012-11-23 DIAGNOSIS — I2699 Other pulmonary embolism without acute cor pulmonale: Secondary | ICD-10-CM | POA: Diagnosis not present

## 2012-12-03 DIAGNOSIS — N186 End stage renal disease: Secondary | ICD-10-CM | POA: Diagnosis not present

## 2012-12-04 DIAGNOSIS — D509 Iron deficiency anemia, unspecified: Secondary | ICD-10-CM | POA: Diagnosis not present

## 2012-12-04 DIAGNOSIS — N2581 Secondary hyperparathyroidism of renal origin: Secondary | ICD-10-CM | POA: Diagnosis not present

## 2012-12-04 DIAGNOSIS — D631 Anemia in chronic kidney disease: Secondary | ICD-10-CM | POA: Diagnosis not present

## 2012-12-04 DIAGNOSIS — N186 End stage renal disease: Secondary | ICD-10-CM | POA: Diagnosis not present

## 2012-12-04 DIAGNOSIS — I2699 Other pulmonary embolism without acute cor pulmonale: Secondary | ICD-10-CM | POA: Diagnosis not present

## 2012-12-09 DIAGNOSIS — D689 Coagulation defect, unspecified: Secondary | ICD-10-CM | POA: Diagnosis not present

## 2012-12-09 DIAGNOSIS — B9689 Other specified bacterial agents as the cause of diseases classified elsewhere: Secondary | ICD-10-CM | POA: Diagnosis not present

## 2012-12-16 DIAGNOSIS — I2699 Other pulmonary embolism without acute cor pulmonale: Secondary | ICD-10-CM | POA: Diagnosis not present

## 2012-12-17 ENCOUNTER — Ambulatory Visit: Payer: Medicare Other | Admitting: Pulmonary Disease

## 2013-01-03 DIAGNOSIS — N186 End stage renal disease: Secondary | ICD-10-CM | POA: Diagnosis not present

## 2013-01-04 DIAGNOSIS — Z23 Encounter for immunization: Secondary | ICD-10-CM | POA: Diagnosis not present

## 2013-01-04 DIAGNOSIS — N2581 Secondary hyperparathyroidism of renal origin: Secondary | ICD-10-CM | POA: Diagnosis not present

## 2013-01-04 DIAGNOSIS — N186 End stage renal disease: Secondary | ICD-10-CM | POA: Diagnosis not present

## 2013-01-04 DIAGNOSIS — D631 Anemia in chronic kidney disease: Secondary | ICD-10-CM | POA: Diagnosis not present

## 2013-01-04 DIAGNOSIS — D509 Iron deficiency anemia, unspecified: Secondary | ICD-10-CM | POA: Diagnosis not present

## 2013-01-13 DIAGNOSIS — I2699 Other pulmonary embolism without acute cor pulmonale: Secondary | ICD-10-CM | POA: Diagnosis not present

## 2013-02-02 DIAGNOSIS — N186 End stage renal disease: Secondary | ICD-10-CM | POA: Diagnosis not present

## 2013-02-03 DIAGNOSIS — D631 Anemia in chronic kidney disease: Secondary | ICD-10-CM | POA: Diagnosis not present

## 2013-02-03 DIAGNOSIS — N2581 Secondary hyperparathyroidism of renal origin: Secondary | ICD-10-CM | POA: Diagnosis not present

## 2013-02-03 DIAGNOSIS — N186 End stage renal disease: Secondary | ICD-10-CM | POA: Diagnosis not present

## 2013-02-03 DIAGNOSIS — D509 Iron deficiency anemia, unspecified: Secondary | ICD-10-CM | POA: Diagnosis not present

## 2013-02-04 ENCOUNTER — Ambulatory Visit: Payer: Medicare Other | Admitting: Pulmonary Disease

## 2013-02-10 DIAGNOSIS — I2699 Other pulmonary embolism without acute cor pulmonale: Secondary | ICD-10-CM | POA: Diagnosis not present

## 2013-03-01 ENCOUNTER — Telehealth: Payer: Self-pay

## 2013-03-01 NOTE — Telephone Encounter (Signed)
Phone call from pt.  Reports she has "aneurysms" on her right arm AVG; c/o one of the aneurysms is more painful.  States "it is a little bigger than a walnut in size, and shiney, but not tight."  Denies any ulceration or drainage from site.  States "I only want to see Dr. Myra Gianotti."  Discussed with Dr. Myra Gianotti.   Recommends to schedule appt. Next Mon., 11/3.  Notified pt. that she will be notified of appt. time for Mon. 11/3.  Agrees w/ plan.

## 2013-03-05 ENCOUNTER — Encounter: Payer: Self-pay | Admitting: Surgery

## 2013-03-05 DIAGNOSIS — N186 End stage renal disease: Secondary | ICD-10-CM | POA: Diagnosis not present

## 2013-03-08 ENCOUNTER — Ambulatory Visit (INDEPENDENT_AMBULATORY_CARE_PROVIDER_SITE_OTHER): Payer: Medicare Other | Admitting: Surgery

## 2013-03-08 ENCOUNTER — Encounter: Payer: Self-pay | Admitting: Surgery

## 2013-03-08 VITALS — BP 148/89 | HR 90 | Ht 63.0 in | Wt 256.0 lb

## 2013-03-08 DIAGNOSIS — D509 Iron deficiency anemia, unspecified: Secondary | ICD-10-CM | POA: Diagnosis not present

## 2013-03-08 DIAGNOSIS — D631 Anemia in chronic kidney disease: Secondary | ICD-10-CM | POA: Diagnosis not present

## 2013-03-08 DIAGNOSIS — N186 End stage renal disease: Secondary | ICD-10-CM

## 2013-03-08 DIAGNOSIS — T82598A Other mechanical complication of other cardiac and vascular devices and implants, initial encounter: Secondary | ICD-10-CM | POA: Diagnosis not present

## 2013-03-08 DIAGNOSIS — N2581 Secondary hyperparathyroidism of renal origin: Secondary | ICD-10-CM | POA: Diagnosis not present
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
## 2013-03-09 ENCOUNTER — Ambulatory Visit: Payer: Medicare Other | Admitting: Pulmonary Disease

## 2013-03-09 ENCOUNTER — Telehealth: Payer: Self-pay

## 2013-03-09 NOTE — Telephone Encounter (Signed)
Call placed to [facility] spoke with Kristine Rios.  Informed nurse of upcoming procedure to replace medical half of Right FA AVGG w/ Bovine Carotid Graft on  03/18/13 by Dr. Myra Gianotti.  Informed that the pt. Is advised to hold Coumadin 5 days prior to procedure.  Pt. Stated that Dr. Darrick Penna manages her Coumadin.  Questioned if Dr. Darrick Penna feels pt. will need Lovenox bridge while holding Coumadin; if needs Lovenox bridge, requesting that this be handled through the kidney center.  Per Kristine Rios, note of request to be faxed to the [facility] for Dr. Darrick Penna to review.

## 2013-03-10 ENCOUNTER — Encounter (HOSPITAL_COMMUNITY): Payer: Self-pay | Admitting: Pharmacy Technician

## 2013-03-10 ENCOUNTER — Other Ambulatory Visit: Payer: Self-pay

## 2013-03-10 DIAGNOSIS — N186 End stage renal disease: Secondary | ICD-10-CM | POA: Diagnosis not present

## 2013-03-10 DIAGNOSIS — D631 Anemia in chronic kidney disease: Secondary | ICD-10-CM | POA: Diagnosis not present

## 2013-03-10 DIAGNOSIS — N2581 Secondary hyperparathyroidism of renal origin: Secondary | ICD-10-CM | POA: Diagnosis not present

## 2013-03-10 DIAGNOSIS — D509 Iron deficiency anemia, unspecified: Secondary | ICD-10-CM | POA: Diagnosis not present

## 2013-03-12 DIAGNOSIS — D631 Anemia in chronic kidney disease: Secondary | ICD-10-CM | POA: Diagnosis not present

## 2013-03-12 DIAGNOSIS — D509 Iron deficiency anemia, unspecified: Secondary | ICD-10-CM | POA: Diagnosis not present

## 2013-03-12 DIAGNOSIS — I2699 Other pulmonary embolism without acute cor pulmonale: Secondary | ICD-10-CM | POA: Diagnosis not present

## 2013-03-12 DIAGNOSIS — N2581 Secondary hyperparathyroidism of renal origin: Secondary | ICD-10-CM | POA: Diagnosis not present

## 2013-03-12 DIAGNOSIS — N186 End stage renal disease: Secondary | ICD-10-CM | POA: Diagnosis not present

## 2013-03-15 DIAGNOSIS — N186 End stage renal disease: Secondary | ICD-10-CM | POA: Diagnosis not present

## 2013-03-15 DIAGNOSIS — D509 Iron deficiency anemia, unspecified: Secondary | ICD-10-CM | POA: Diagnosis not present

## 2013-03-15 DIAGNOSIS — N2581 Secondary hyperparathyroidism of renal origin: Secondary | ICD-10-CM | POA: Diagnosis not present

## 2013-03-15 DIAGNOSIS — D631 Anemia in chronic kidney disease: Secondary | ICD-10-CM | POA: Diagnosis not present

## 2013-03-16 ENCOUNTER — Encounter (HOSPITAL_COMMUNITY): Payer: Self-pay | Admitting: *Deleted

## 2013-03-17 DIAGNOSIS — D631 Anemia in chronic kidney disease: Secondary | ICD-10-CM | POA: Diagnosis not present

## 2013-03-17 DIAGNOSIS — N2581 Secondary hyperparathyroidism of renal origin: Secondary | ICD-10-CM | POA: Diagnosis not present

## 2013-03-17 DIAGNOSIS — D509 Iron deficiency anemia, unspecified: Secondary | ICD-10-CM | POA: Diagnosis not present

## 2013-03-17 DIAGNOSIS — N186 End stage renal disease: Secondary | ICD-10-CM | POA: Diagnosis not present

## 2013-03-17 MED ORDER — DEXTROSE 5 % IV SOLN
1.5000 g | INTRAVENOUS | Status: AC
  Administered 2013-03-18: 1.5 g via INTRAVENOUS
  Filled 2013-03-17: qty 1.5

## 2013-03-18 ENCOUNTER — Ambulatory Visit (HOSPITAL_COMMUNITY)
Admission: RE | Admit: 2013-03-18 | Discharge: 2013-03-18 | Disposition: A | Discharge status: Home / Self Care | Payer: Medicare Other | Source: Ambulatory Visit | Attending: Surgery | Admitting: Surgery

## 2013-03-18 ENCOUNTER — Ambulatory Visit (HOSPITAL_COMMUNITY): Payer: Medicare Other | Admitting: Anesthesiology

## 2013-03-18 ENCOUNTER — Encounter (HOSPITAL_COMMUNITY)
Admission: RE | Disposition: A | Discharge status: Home / Self Care | Payer: Self-pay | Source: Ambulatory Visit | Attending: Surgery

## 2013-03-18 ENCOUNTER — Encounter (HOSPITAL_COMMUNITY): Payer: Medicare Other | Admitting: Anesthesiology

## 2013-03-18 DIAGNOSIS — I2699 Other pulmonary embolism without acute cor pulmonale: Secondary | ICD-10-CM | POA: Diagnosis not present

## 2013-03-18 DIAGNOSIS — T82898A Other specified complication of vascular prosthetic devices, implants and grafts, initial encounter: Secondary | ICD-10-CM | POA: Insufficient documentation

## 2013-03-18 DIAGNOSIS — E669 Obesity, unspecified: Secondary | ICD-10-CM | POA: Diagnosis not present

## 2013-03-18 DIAGNOSIS — Y832 Surgical operation with anastomosis, bypass or graft as the cause of abnormal reaction of the patient, or of later complication, without mention of misadventure at the time of the procedure: Secondary | ICD-10-CM | POA: Insufficient documentation

## 2013-03-18 DIAGNOSIS — I120 Hypertensive chronic kidney disease with stage 5 chronic kidney disease or end stage renal disease: Secondary | ICD-10-CM | POA: Diagnosis not present

## 2013-03-18 DIAGNOSIS — I770 Arteriovenous fistula, acquired: Secondary | ICD-10-CM | POA: Insufficient documentation

## 2013-03-18 DIAGNOSIS — Z992 Dependence on renal dialysis: Secondary | ICD-10-CM | POA: Diagnosis not present

## 2013-03-18 DIAGNOSIS — I10 Essential (primary) hypertension: Secondary | ICD-10-CM | POA: Diagnosis not present

## 2013-03-18 DIAGNOSIS — N186 End stage renal disease: Secondary | ICD-10-CM | POA: Diagnosis not present

## 2013-03-18 HISTORY — DX: Sickle-cell trait: D57.3

## 2013-03-18 HISTORY — PX: REVISION OF ARTERIOVENOUS GORETEX GRAFT: SHX6073

## 2013-03-18 LAB — POCT I-STAT 4, (NA,K, GLUC, HGB,HCT)
Glucose, Bld: 95 mg/dL (ref 70–99)
HCT: 32 % — ABNORMAL LOW (ref 36.0–46.0)
Hemoglobin: 10.9 g/dL — ABNORMAL LOW (ref 12.0–15.0)

## 2013-03-18 LAB — PROTIME-INR: Prothrombin Time: 15.9 seconds — ABNORMAL HIGH (ref 11.6–15.2)

## 2013-03-18 LAB — HCG, SERUM, QUALITATIVE: Preg, Serum: NEGATIVE

## 2013-03-18 SURGERY — REVISION OF ARTERIOVENOUS GORETEX GRAFT
Anesthesia: General | Site: Arm Lower | Laterality: Right | Wound class: Clean

## 2013-03-18 MED ORDER — OXYCODONE HCL 5 MG PO TABS
ORAL_TABLET | ORAL | Status: AC
  Administered 2013-03-18: 5 mg via ORAL
  Filled 2013-03-18: qty 1

## 2013-03-18 MED ORDER — SODIUM CHLORIDE 0.9 % IV SOLN
INTRAVENOUS | Status: DC
  Administered 2013-03-18 (×2): via INTRAVENOUS

## 2013-03-18 MED ORDER — SODIUM CHLORIDE 0.9 % IV SOLN
INTRAVENOUS | Status: DC
  Administered 2013-03-18: 08:00:00 via INTRAVENOUS

## 2013-03-18 MED ORDER — PROPOFOL 10 MG/ML IV BOLUS
INTRAVENOUS | Status: DC | PRN
  Administered 2013-03-18: 100 mg via INTRAVENOUS
  Administered 2013-03-18: 40 mg via INTRAVENOUS
  Administered 2013-03-18: 200 mg via INTRAVENOUS

## 2013-03-18 MED ORDER — PHENYLEPHRINE HCL 10 MG/ML IJ SOLN
10.0000 mg | INTRAVENOUS | Status: DC | PRN
  Administered 2013-03-18: 10 ug/min via INTRAVENOUS

## 2013-03-18 MED ORDER — OXYCODONE HCL 5 MG PO TABS
5.0000 mg | ORAL_TABLET | Freq: Once | ORAL | Status: AC | PRN
  Administered 2013-03-18: 5 mg via ORAL

## 2013-03-18 MED ORDER — ONDANSETRON HCL 4 MG/2ML IJ SOLN: 4.0000 mg | Freq: Four times a day (QID) | INTRAMUSCULAR | Status: DC | PRN

## 2013-03-18 MED ORDER — OXYCODONE HCL 5 MG/5ML PO SOLN: 5.0000 mg | Freq: Once | ORAL | Status: AC | PRN

## 2013-03-18 MED ORDER — FENTANYL CITRATE 0.05 MG/ML IJ SOLN
25.0000 ug | INTRAMUSCULAR | Status: DC | PRN
  Administered 2013-03-18 (×2): 50 ug via INTRAVENOUS

## 2013-03-18 MED ORDER — HEPARIN SODIUM (PORCINE) 1000 UNIT/ML IJ SOLN
INTRAMUSCULAR | Status: DC | PRN
  Administered 2013-03-18: 2500 [IU]

## 2013-03-18 MED ORDER — MIDAZOLAM HCL 5 MG/5ML IJ SOLN
INTRAMUSCULAR | Status: DC | PRN
  Administered 2013-03-18: 2 mg via INTRAVENOUS

## 2013-03-18 MED ORDER — LIDOCAINE-EPINEPHRINE (PF) 1 %-1:200000 IJ SOLN
INTRAMUSCULAR | Status: AC
  Filled 2013-03-18: qty 10

## 2013-03-18 MED ORDER — HEPARIN SODIUM (PORCINE) 1000 UNIT/ML IJ SOLN
INTRAMUSCULAR | Status: AC
  Filled 2013-03-18: qty 1

## 2013-03-18 MED ORDER — SODIUM CHLORIDE 0.9 % IR SOLN
Status: DC | PRN
  Administered 2013-03-18: 13:00:00

## 2013-03-18 MED ORDER — FENTANYL CITRATE 0.05 MG/ML IJ SOLN
INTRAMUSCULAR | Status: DC | PRN
  Administered 2013-03-18: 100 ug via INTRAVENOUS
  Administered 2013-03-18 (×2): 25 ug via INTRAVENOUS

## 2013-03-18 MED ORDER — FENTANYL CITRATE 0.05 MG/ML IJ SOLN
INTRAMUSCULAR | Status: AC
  Administered 2013-03-18: 50 ug via INTRAVENOUS
  Filled 2013-03-18: qty 2

## 2013-03-18 MED ORDER — PHENYLEPHRINE HCL 10 MG/ML IJ SOLN
INTRAMUSCULAR | Status: DC | PRN
  Administered 2013-03-18: 80 ug via INTRAVENOUS

## 2013-03-18 MED ORDER — LIDOCAINE HCL (CARDIAC) 20 MG/ML IV SOLN
INTRAVENOUS | Status: DC | PRN
  Administered 2013-03-18: 70 mg via INTRAVENOUS

## 2013-03-18 MED ORDER — OXYCODONE-ACETAMINOPHEN 5-325 MG PO TABS: 1.0000 | ORAL_TABLET | Freq: Four times a day (QID) | ORAL | Status: DC | PRN

## 2013-03-18 MED ORDER — PROTAMINE SULFATE 10 MG/ML IV SOLN
INTRAVENOUS | Status: DC | PRN
  Administered 2013-03-18: 50 mg via INTRAVENOUS

## 2013-03-18 MED ORDER — ONDANSETRON HCL 4 MG/2ML IJ SOLN
INTRAMUSCULAR | Status: DC | PRN
  Administered 2013-03-18: 4 mg via INTRAVENOUS

## 2013-03-18 MED ORDER — SODIUM CHLORIDE 0.9 % IR SOLN
Status: DC | PRN
  Administered 2013-03-18 (×2): 1

## 2013-03-18 MED ORDER — HEPARIN SODIUM (PORCINE) 1000 UNIT/ML IJ SOLN
INTRAMUSCULAR | Status: DC | PRN
  Administered 2013-03-18: 6000 [IU] via INTRAVENOUS

## 2013-03-18 SURGICAL SUPPLY — 44 items
ADH SKN CLS APL DERMABOND .7 (GAUZE/BANDAGES/DRESSINGS) ×1
CANISTER SUCTION 2500CC (MISCELLANEOUS) ×2 IMPLANT
CLIP TI MEDIUM 6 (CLIP) ×2 IMPLANT
CLIP TI WIDE RED SMALL 6 (CLIP) ×2 IMPLANT
COVER SURGICAL LIGHT HANDLE (MISCELLANEOUS) ×2 IMPLANT
DERMABOND ADVANCED (GAUZE/BANDAGES/DRESSINGS) ×1
DERMABOND ADVANCED .7 DNX12 (GAUZE/BANDAGES/DRESSINGS) ×1 IMPLANT
ELECT REM PT RETURN 9FT ADLT (ELECTROSURGICAL) ×2
ELECTRODE REM PT RTRN 9FT ADLT (ELECTROSURGICAL) ×1 IMPLANT
GEL ULTRASOUND 20GR AQUASONIC (MISCELLANEOUS) IMPLANT
GLOVE BIO SURGEON STRL SZ7.5 (GLOVE) ×1 IMPLANT
GLOVE BIOGEL PI IND STRL 6.5 (GLOVE) IMPLANT
GLOVE BIOGEL PI IND STRL 7.0 (GLOVE) IMPLANT
GLOVE BIOGEL PI IND STRL 7.5 (GLOVE) ×1 IMPLANT
GLOVE BIOGEL PI INDICATOR 6.5 (GLOVE) ×1
GLOVE BIOGEL PI INDICATOR 7.0 (GLOVE) ×1
GLOVE BIOGEL PI INDICATOR 7.5 (GLOVE) ×1
GLOVE ECLIPSE 6.5 STRL STRAW (GLOVE) ×1 IMPLANT
GLOVE SS BIOGEL STRL SZ 6.5 (GLOVE) IMPLANT
GLOVE SUPERSENSE BIOGEL SZ 6.5 (GLOVE) ×1
GLOVE SURG SS PI 7.5 STRL IVOR (GLOVE) ×2 IMPLANT
GOWN PREVENTION PLUS XXLARGE (GOWN DISPOSABLE) ×2 IMPLANT
GOWN STRL NON-REIN LRG LVL3 (GOWN DISPOSABLE) ×7 IMPLANT
GRAFT COLLAGEN VASCULAR 6X30 (Vascular Products) ×1 IMPLANT
HEMOSTAT SNOW SURGICEL 2X4 (HEMOSTASIS) IMPLANT
KIT BASIN OR (CUSTOM PROCEDURE TRAY) ×2 IMPLANT
KIT ROOM TURNOVER OR (KITS) ×2 IMPLANT
NDL 18GX1X1/2 (RX/OR ONLY) (NEEDLE) IMPLANT
NDL HYPO 25GX1X1/2 BEV (NEEDLE) ×1 IMPLANT
NEEDLE 18GX1X1/2 (RX/OR ONLY) (NEEDLE) ×2 IMPLANT
NEEDLE HYPO 25GX1X1/2 BEV (NEEDLE) ×2 IMPLANT
NS IRRIG 1000ML POUR BTL (IV SOLUTION) ×3 IMPLANT
PACK CV ACCESS (CUSTOM PROCEDURE TRAY) ×2 IMPLANT
PAD ARMBOARD 7.5X6 YLW CONV (MISCELLANEOUS) ×4 IMPLANT
SUT PROLENE 6 0 BV (SUTURE) ×5 IMPLANT
SUT SILK 2 0 SH (SUTURE) ×1 IMPLANT
SUT VIC AB 3-0 SH 27 (SUTURE) ×4
SUT VIC AB 3-0 SH 27X BRD (SUTURE) ×2 IMPLANT
SUT VICRYL 4-0 PS2 18IN ABS (SUTURE) IMPLANT
SYR 3ML LL SCALE MARK (SYRINGE) ×1 IMPLANT
TOWEL OR 17X24 6PK STRL BLUE (TOWEL DISPOSABLE) ×2 IMPLANT
TOWEL OR 17X26 10 PK STRL BLUE (TOWEL DISPOSABLE) ×2 IMPLANT
UNDERPAD 30X30 INCONTINENT (UNDERPADS AND DIAPERS) ×2 IMPLANT
WATER STERILE IRR 1000ML POUR (IV SOLUTION) ×2 IMPLANT
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
## 2013-03-18 NOTE — Transfer of Care (Signed)
Immediate Anesthesia Transfer of Care Note  Patient: Kristine Rios  Procedure(s) Performed: Procedure(s): REPLACE MEDIAL 1/2 OF RIGHT FOREARM  ARTERIOVENOUS GORETEX GRAFT WITH BOVINE CAROTID GRAFT (Right)  Patient Location: PACU  Anesthesia Type:General  Level of Consciousness: awake  Airway & Oxygen Therapy: Patient Spontanous Breathing and Patient connected to face mask oxygen  Post-op Assessment: Report given to PACU RN and Post -op Vital signs reviewed and stable  Post vital signs: Reviewed and stable  Complications: No apparent anesthesia complications

## 2013-03-18 NOTE — Anesthesia Preprocedure Evaluation (Signed)
Anesthesia Evaluation  Patient identified by MRN, date of birth, ID band Patient awake    Reviewed: Allergy & Precautions, H&P , NPO status , Patient's Chart, lab work & pertinent test results  Airway Mallampati: II  Neck ROM: full    Dental   Pulmonary shortness of breath,          Cardiovascular hypertension,     Neuro/Psych    GI/Hepatic   Endo/Other  Morbid obesity  Renal/GU ESRFRenal disease     Musculoskeletal   Abdominal   Peds  Hematology  (+) Blood dyscrasia, Sickle cell trait ,   Anesthesia Other Findings   Reproductive/Obstetrics                           Anesthesia Physical Anesthesia Plan  ASA: III  Anesthesia Plan: General   Post-op Pain Management:    Induction: Intravenous  Airway Management Planned: LMA  Additional Equipment:   Intra-op Plan:   Post-operative Plan:   Informed Consent: I have reviewed the patients History and Physical, chart, labs and discussed the procedure including the risks, benefits and alternatives for the proposed anesthesia with the patient or authorized representative who has indicated his/her understanding and acceptance.     Plan Discussed with: CRNA, Anesthesiologist and Surgeon  Anesthesia Plan Comments:         Anesthesia Quick Evaluation

## 2013-03-18 NOTE — Anesthesia Postprocedure Evaluation (Signed)
  Anesthesia Post-op Note  Patient: Kristine Rios  Procedure(s) Performed: Procedure(s): REPLACE MEDIAL 1/2 OF RIGHT FOREARM  ARTERIOVENOUS GORETEX GRAFT WITH BOVINE CAROTID GRAFT (Right)  Patient Location: PACU  Anesthesia Type:General  Level of Consciousness: awake, alert , oriented and patient cooperative  Airway and Oxygen Therapy: Patient Spontanous Breathing and Patient connected to nasal cannula oxygen  Post-op Pain: none  Post-op Assessment: Post-op Vital signs reviewed, Patient's Cardiovascular Status Stable, Respiratory Function Stable, Patent Airway, No signs of Nausea or vomiting and Pain level controlled  Post-op Vital Signs: Reviewed and stable  Complications: No apparent anesthesia complications

## 2013-03-18 NOTE — Preoperative (Signed)
Beta Blockers   Reason not to administer Beta Blockers:Not Applicable 

## 2013-03-18 NOTE — Op Note (Signed)
[facility] [place]  Patient name: Kristine Rios MRN: [id number] DOB: [birth year] Sex: female  03/18/2013 Pre-operative Diagnosis: Aneurysmal right forearm dialysis graft Post-operative diagnosis:  Same Surgeon:  Jorge Ny Assistants:  Narda Amber, PA Procedure:   Replacement of the medial half of the right forearm loop graft with 6 mm bovine carotid graft Anesthesia:  Gen. Blood Loss:  See anesthesia record Specimens:  None  Indications:  The patient previously had a right forearm loop graft placed with bovine carotid.  She has a Gore-Tex allergy.  She developed a large aneurysm on the medial side of the graft.  She also has aneurysmal changes to the lateral side of the graft.  In order to avoid placing a catheter, I have elected to replace just the medial half of the graft.  This is the largest of the 2 aneurysms.  Procedure:  The patient was identified in the holding area and taken to [facility] OR ROOM 16  The patient was then placed supine on the table. general anesthesia was administered.  The patient was prepped and draped in the usual sterile fashion.  A time out was called and antibiotics were administered.  I made 2 incisions one near the antecubital crease the other at the apex of the graft.  Through these incisions the previously placed bovine carotid graft was dissected free.  I then created a tunnel between the 2 incisions, passing to the inside of the aneurysmal portion on the medial side.  The patient was fully heparinized.  After the heparin circulated the graft was occluded.  I transected the graft near the apex.  I tried to evacuate the thrombus and blood from the aneurysm on the medial side of the graft.  I then ligated this portion of the graft.  The bovine carotid graft which was prepared on the back table was marked for orientation.  It was also checked to make sure there were no leaks.  I performed a end to end anastomosis using running 6-0 Prolene.   Once the anastomosis was completed, the remaining portion of the graft was brought through the previously created tunnel, making sure to maintain proper orientation.  The clamp was then released.  The anastomosis was hemostatic.  There was excellent flow through the graft.  The graft was then reoccluded.  I then transected the end at the antecubital crease.  I was able to evacuate more thrombus and blood from the aneurysmal segment.  I then ligated this defunctional limb with 2-0 silk tie.  The graft was then cut to the appropriate length.  A running anastomosis was then created in a end to end fashion.  Just prior to completion, the appropriate flushing maneuvers were performed, and the anastomosis was completed.  Clamps were released.  There was excellent blood flow through the graft with excellent thrill.  50 mg of protamine was administered.  Once hemostasis was satisfactory, the incisions were closed with 2 layers of Vicryl.  Dermabond was applied.  There were no immediate complications.   Disposition:  To PACU in stable condition.   Juleen China, M.D. [facility] [place] Office: [phone] Pager:  [phone]

## 2013-03-18 NOTE — Anesthesia Procedure Notes (Signed)
Procedure Name: LMA Insertion Date/Time: 03/18/2013 2:11 PM Performed by: Gwenyth Allegra Pre-anesthesia Checklist: Patient identified, Timeout performed and Suction available Patient Re-evaluated:Patient Re-evaluated prior to inductionPreoxygenation: Pre-oxygenation with 100% oxygen Intubation Type: IV induction LMA: LMA inserted LMA Size: 4.0 Number of attempts: 1 Placement Confirmation: positive ETCO2 and breath sounds checked- equal and bilateral Tube secured with: Tape Dental Injury: Teeth and Oropharynx as per pre-operative assessment

## 2013-03-18 NOTE — Interval H&P Note (Signed)
History and Physical Interval Note:  03/18/2013 12:30 PM  Kristine Rios  has presented today for surgery, with the diagnosis of Complication of right arm arteriovenous gortex graft End Stage Renal Disease  The various methods of treatment have been discussed with the patient and family. After consideration of risks, benefits and other options for treatment, the patient has consented to  Procedure(s): REPLACE MEDIAL 1/2 OF RIGHT FOREARM  ARTERIOVENOUS GORETEX GRAFT WITH BOVINE CAROTID GRAFT (Right) as a surgical intervention .  The patient's history has been reviewed, patient examined, no change in status, stable for surgery.  I have reviewed the patient's chart and labs.  Questions were answered to the patient's satisfaction.     BRABHAM IV, V. WELLS

## 2013-03-19 DIAGNOSIS — D631 Anemia in chronic kidney disease: Secondary | ICD-10-CM | POA: Diagnosis not present

## 2013-03-19 DIAGNOSIS — N2581 Secondary hyperparathyroidism of renal origin: Secondary | ICD-10-CM | POA: Diagnosis not present

## 2013-03-19 DIAGNOSIS — D509 Iron deficiency anemia, unspecified: Secondary | ICD-10-CM | POA: Diagnosis not present

## 2013-03-19 DIAGNOSIS — N186 End stage renal disease: Secondary | ICD-10-CM | POA: Diagnosis not present

## 2013-03-22 DIAGNOSIS — N186 End stage renal disease: Secondary | ICD-10-CM | POA: Diagnosis not present

## 2013-03-22 DIAGNOSIS — D631 Anemia in chronic kidney disease: Secondary | ICD-10-CM | POA: Diagnosis not present

## 2013-03-22 DIAGNOSIS — D509 Iron deficiency anemia, unspecified: Secondary | ICD-10-CM | POA: Diagnosis not present

## 2013-03-22 DIAGNOSIS — N2581 Secondary hyperparathyroidism of renal origin: Secondary | ICD-10-CM | POA: Diagnosis not present

## 2013-03-24 DIAGNOSIS — D509 Iron deficiency anemia, unspecified: Secondary | ICD-10-CM | POA: Diagnosis not present

## 2013-03-24 DIAGNOSIS — D631 Anemia in chronic kidney disease: Secondary | ICD-10-CM | POA: Diagnosis not present

## 2013-03-24 DIAGNOSIS — N2581 Secondary hyperparathyroidism of renal origin: Secondary | ICD-10-CM | POA: Diagnosis not present

## 2013-03-24 DIAGNOSIS — N186 End stage renal disease: Secondary | ICD-10-CM | POA: Diagnosis not present

## 2013-03-26 DIAGNOSIS — N2581 Secondary hyperparathyroidism of renal origin: Secondary | ICD-10-CM | POA: Diagnosis not present

## 2013-03-26 DIAGNOSIS — N186 End stage renal disease: Secondary | ICD-10-CM | POA: Diagnosis not present

## 2013-03-26 DIAGNOSIS — D631 Anemia in chronic kidney disease: Secondary | ICD-10-CM | POA: Diagnosis not present

## 2013-03-26 DIAGNOSIS — D509 Iron deficiency anemia, unspecified: Secondary | ICD-10-CM | POA: Diagnosis not present

## 2013-03-29 DIAGNOSIS — N2581 Secondary hyperparathyroidism of renal origin: Secondary | ICD-10-CM | POA: Diagnosis not present

## 2013-03-29 DIAGNOSIS — N186 End stage renal disease: Secondary | ICD-10-CM | POA: Diagnosis not present

## 2013-03-29 DIAGNOSIS — D631 Anemia in chronic kidney disease: Secondary | ICD-10-CM | POA: Diagnosis not present

## 2013-03-29 DIAGNOSIS — D509 Iron deficiency anemia, unspecified: Secondary | ICD-10-CM | POA: Diagnosis not present

## 2013-03-30 ENCOUNTER — Encounter (HOSPITAL_COMMUNITY): Payer: Self-pay | Admitting: Surgery

## 2013-03-31 DIAGNOSIS — D509 Iron deficiency anemia, unspecified: Secondary | ICD-10-CM | POA: Diagnosis not present

## 2013-03-31 DIAGNOSIS — D631 Anemia in chronic kidney disease: Secondary | ICD-10-CM | POA: Diagnosis not present

## 2013-03-31 DIAGNOSIS — N2581 Secondary hyperparathyroidism of renal origin: Secondary | ICD-10-CM | POA: Diagnosis not present

## 2013-03-31 DIAGNOSIS — N186 End stage renal disease: Secondary | ICD-10-CM | POA: Diagnosis not present

## 2013-04-03 DIAGNOSIS — D509 Iron deficiency anemia, unspecified: Secondary | ICD-10-CM | POA: Diagnosis not present

## 2013-04-03 DIAGNOSIS — N2581 Secondary hyperparathyroidism of renal origin: Secondary | ICD-10-CM | POA: Diagnosis not present

## 2013-04-03 DIAGNOSIS — D631 Anemia in chronic kidney disease: Secondary | ICD-10-CM | POA: Diagnosis not present

## 2013-04-03 DIAGNOSIS — N186 End stage renal disease: Secondary | ICD-10-CM | POA: Diagnosis not present

## 2013-04-04 DIAGNOSIS — N186 End stage renal disease: Secondary | ICD-10-CM | POA: Diagnosis not present

## 2013-04-05 DIAGNOSIS — N186 End stage renal disease: Secondary | ICD-10-CM | POA: Diagnosis not present

## 2013-04-05 DIAGNOSIS — D509 Iron deficiency anemia, unspecified: Secondary | ICD-10-CM | POA: Diagnosis not present

## 2013-04-05 DIAGNOSIS — D631 Anemia in chronic kidney disease: Secondary | ICD-10-CM | POA: Diagnosis not present

## 2013-04-05 DIAGNOSIS — N2581 Secondary hyperparathyroidism of renal origin: Secondary | ICD-10-CM | POA: Diagnosis not present

## 2013-04-30 DIAGNOSIS — N2581 Secondary hyperparathyroidism of renal origin: Secondary | ICD-10-CM | POA: Diagnosis not present

## 2013-04-30 DIAGNOSIS — D631 Anemia in chronic kidney disease: Secondary | ICD-10-CM | POA: Diagnosis not present

## 2013-04-30 DIAGNOSIS — N186 End stage renal disease: Secondary | ICD-10-CM | POA: Diagnosis not present

## 2013-05-05 DIAGNOSIS — N186 End stage renal disease: Secondary | ICD-10-CM | POA: Diagnosis not present

## 2013-05-07 DIAGNOSIS — D631 Anemia in chronic kidney disease: Secondary | ICD-10-CM | POA: Diagnosis not present

## 2013-05-07 DIAGNOSIS — D509 Iron deficiency anemia, unspecified: Secondary | ICD-10-CM | POA: Diagnosis not present

## 2013-05-07 DIAGNOSIS — N2581 Secondary hyperparathyroidism of renal origin: Secondary | ICD-10-CM | POA: Diagnosis not present

## 2013-05-07 DIAGNOSIS — N186 End stage renal disease: Secondary | ICD-10-CM | POA: Diagnosis not present

## 2013-05-19 DIAGNOSIS — I2699 Other pulmonary embolism without acute cor pulmonale: Secondary | ICD-10-CM | POA: Diagnosis not present

## 2013-05-25 ENCOUNTER — Other Ambulatory Visit: Payer: Self-pay

## 2013-05-26 ENCOUNTER — Other Ambulatory Visit: Payer: Self-pay

## 2013-05-26 ENCOUNTER — Telehealth: Payer: Self-pay

## 2013-05-26 NOTE — Telephone Encounter (Signed)
Rec'd return call from VanderLinda, RN @ [facility];  Stated that Dr. Allena KatzPatel ordered a Lovenox bridge 100 mg subcu., qd. X 7 days.  Advised that Dr. Myra GianottiBrabham would require that the pt. Hold the Lovenox on the morning of the 27th, the day of the procedure.  Verb. Understanding and stated she will give the instructions to the pt.

## 2013-05-26 NOTE — Telephone Encounter (Signed)
[facility] to discuss need for pt. to stop Coumadin 5 days [facility]; spoke with Bonita QuinLinda, Charity fundraiserN.   Questioned if pt. needed to be bridged with Lovenox, while off Coumadin, with hx. of Pulmonary Embolism.  Stated that the Nephrologist manages pt's Coumadin, and she will address this with the MD, when he rounds on pt. today.  [facility], along with pre-procedure instructions for the patient.

## 2013-06-01 ENCOUNTER — Ambulatory Visit (HOSPITAL_COMMUNITY)
Admission: RE | Admit: 2013-06-01 | Discharge: 2013-06-01 | Disposition: A | Discharge status: Home / Self Care | Payer: Medicare Other | Source: Ambulatory Visit | Attending: Surgery | Admitting: Surgery

## 2013-06-01 ENCOUNTER — Encounter (HOSPITAL_COMMUNITY)
Admission: RE | Disposition: A | Discharge status: Home / Self Care | Payer: Self-pay | Source: Ambulatory Visit | Attending: Surgery

## 2013-06-01 SURGERY — ASSESSMENT, SHUNT FUNCTION, WITH CONTRAST RADIOGRAPHIC STUDY
Anesthesia: LOCAL | Laterality: Right

## 2013-06-05 DIAGNOSIS — N186 End stage renal disease: Secondary | ICD-10-CM | POA: Diagnosis not present

## 2013-06-08 ENCOUNTER — Telehealth: Payer: Self-pay

## 2013-06-08 DIAGNOSIS — N2581 Secondary hyperparathyroidism of renal origin: Secondary | ICD-10-CM | POA: Diagnosis not present

## 2013-06-08 DIAGNOSIS — N186 End stage renal disease: Secondary | ICD-10-CM | POA: Diagnosis not present

## 2013-06-08 DIAGNOSIS — D509 Iron deficiency anemia, unspecified: Secondary | ICD-10-CM | POA: Diagnosis not present

## 2013-06-08 DIAGNOSIS — T827XXA Infection and inflammatory reaction due to other cardiac and vascular devices, implants and grafts, initial encounter: Secondary | ICD-10-CM | POA: Diagnosis not present

## 2013-06-08 DIAGNOSIS — N039 Chronic nephritic syndrome with unspecified morphologic changes: Secondary | ICD-10-CM | POA: Diagnosis not present

## 2013-06-08 DIAGNOSIS — D631 Anemia in chronic kidney disease: Secondary | ICD-10-CM | POA: Diagnosis not present

## 2013-06-08 NOTE — Telephone Encounter (Signed)
[facility]/ reports pt. has a very small open area "bigger than pin head size, and smaller than pea size" that is draining a light yellow drainage from the arterial limb of the (R) arm AVG.  States there is redness around the open area.  Previously scheduled Shuntogram for 2/10 cancelled at this time.  Requesting an office evaluation of large aneurysm (R) Arm AVG with small opening in skin.  Pt. requesting to only see Dr. Myra GianottiBrabham.  Will schedule appt. 06/14/13; pt. Will be advised per Bonita QuinLinda, RN @ [facility] to go to the ER if her symptoms worsen prior to 2/9.

## 2013-06-11 ENCOUNTER — Encounter: Payer: Self-pay | Admitting: Surgery

## 2013-06-14 ENCOUNTER — Ambulatory Visit (INDEPENDENT_AMBULATORY_CARE_PROVIDER_SITE_OTHER): Payer: Self-pay | Admitting: Surgery

## 2013-06-14 ENCOUNTER — Encounter: Payer: Self-pay | Admitting: Surgery

## 2013-06-14 VITALS — BP 128/77 | HR 88 | Ht 63.0 in | Wt 252.0 lb

## 2013-06-14 DIAGNOSIS — N186 End stage renal disease: Secondary | ICD-10-CM
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
## 2013-06-15 ENCOUNTER — Encounter (HOSPITAL_COMMUNITY): Admission: RE | Payer: Self-pay | Source: Ambulatory Visit

## 2013-06-15 ENCOUNTER — Ambulatory Visit (HOSPITAL_COMMUNITY): Admission: RE | Admit: 2013-06-15 | Payer: Medicare Other | Source: Ambulatory Visit | Admitting: Surgery

## 2013-06-15 SURGERY — ASSESSMENT, SHUNT FUNCTION, WITH CONTRAST RADIOGRAPHIC STUDY
Anesthesia: LOCAL | Laterality: Right

## 2013-06-16 ENCOUNTER — Telehealth: Payer: Self-pay | Admitting: *Deleted

## 2013-06-16 ENCOUNTER — Telehealth: Payer: Self-pay

## 2013-06-16 DIAGNOSIS — I2699 Other pulmonary embolism without acute cor pulmonale: Secondary | ICD-10-CM | POA: Diagnosis not present

## 2013-06-16 NOTE — Telephone Encounter (Signed)
Rec'd phone call from Kristine Rios, nurse @ [facility].  Reported that he received v.o. From Dr. Darrick Pennaeterding that okay to hold Coumadin x 5 days, prior to procedure, on 2/20, and not necessary to have Lovenox bridge while holding Coumadin.  Pt. notified of Dr. Deterding's recommendation. Verb. Understanding.

## 2013-06-16 NOTE — Telephone Encounter (Signed)
Pt. is scheduled for "Replacement of Lateral 1/2 of Right  Forearm Arteriovenous Gortex Graft" 06/25/13.  Notified pt. of need to hold Coumadin 5 days prior to surgery; advised to take last dose of Coumadin on 06/19/13.  Verb. Understanding.  Phone call to Ken, nurse, @ [facility] regarding instructions given to pt. re: holding Coumadin 5 days prior to procedure.  Requested that Ken make Dr. Deterding aware of pt's instructions re: coumadin, and check on Lovenox bridge; requesting that Dr. Deterding dose the Lovenox.  Will fax note to [facility] @ [phone]. 

## 2013-06-16 NOTE — Telephone Encounter (Deleted)
Pt. is scheduled for "Replacement of Lateral 1/2 of Right  Forearm Arteriovenous Gortex Graft" 06/25/13.  Notified pt. of need to hold Coumadin 5 days prior to surgery; advised to take last dose of Coumadin on 06/19/13.  Verb. Understanding.  Phone call to Rocky LinkKen, nurse, @ [facility] regarding instructions given to pt. re: holding Coumadin 5 days prior to procedure.  [facility]rick Penna[facility]ding aware of pt's instructions re: coumadin, and check on Lovenox bridge; requesting that Dr. Darrick Pennaeterding dose the Lovenox.  [facility] @ [phone].

## 2013-06-18 ENCOUNTER — Encounter (HOSPITAL_COMMUNITY): Payer: Self-pay | Admitting: Pharmacist

## 2013-06-18 DIAGNOSIS — R042 Hemoptysis: Secondary | ICD-10-CM | POA: Diagnosis not present

## 2013-06-21 ENCOUNTER — Other Ambulatory Visit: Payer: Self-pay

## 2013-06-23 ENCOUNTER — Encounter (HOSPITAL_COMMUNITY): Payer: Self-pay | Admitting: *Deleted

## 2013-06-24 MED ORDER — DEXTROSE 5 % IV SOLN
1.5000 g | INTRAVENOUS | Status: AC
  Administered 2013-06-25: 1.5 g via INTRAVENOUS
  Filled 2013-06-24: qty 1.5

## 2013-06-25 ENCOUNTER — Ambulatory Visit (HOSPITAL_COMMUNITY)
Admission: RE | Admit: 2013-06-25 | Discharge: 2013-06-25 | Disposition: A | Discharge status: Home / Self Care | Payer: Medicare Other | Source: Ambulatory Visit | Attending: Surgery | Admitting: Surgery

## 2013-06-25 ENCOUNTER — Encounter (HOSPITAL_COMMUNITY)
Admission: RE | Disposition: A | Discharge status: Home / Self Care | Payer: Self-pay | Source: Ambulatory Visit | Attending: Surgery

## 2013-06-25 ENCOUNTER — Ambulatory Visit (HOSPITAL_COMMUNITY): Payer: Medicare Other | Admitting: Anesthesiology

## 2013-06-25 ENCOUNTER — Ambulatory Visit (HOSPITAL_COMMUNITY): Payer: Medicare Other

## 2013-06-25 ENCOUNTER — Encounter (HOSPITAL_COMMUNITY): Payer: Self-pay | Admitting: *Deleted

## 2013-06-25 ENCOUNTER — Encounter (HOSPITAL_COMMUNITY): Payer: Medicare Other | Admitting: Anesthesiology

## 2013-06-25 DIAGNOSIS — Z01818 Encounter for other preprocedural examination: Secondary | ICD-10-CM | POA: Diagnosis not present

## 2013-06-25 DIAGNOSIS — T82898A Other specified complication of vascular prosthetic devices, implants and grafts, initial encounter: Secondary | ICD-10-CM | POA: Diagnosis not present

## 2013-06-25 DIAGNOSIS — I120 Hypertensive chronic kidney disease with stage 5 chronic kidney disease or end stage renal disease: Secondary | ICD-10-CM | POA: Insufficient documentation

## 2013-06-25 DIAGNOSIS — Z6841 Body Mass Index (BMI) 40.0 and over, adult: Secondary | ICD-10-CM | POA: Insufficient documentation

## 2013-06-25 DIAGNOSIS — Y832 Surgical operation with anastomosis, bypass or graft as the cause of abnormal reaction of the patient, or of later complication, without mention of misadventure at the time of the procedure: Secondary | ICD-10-CM | POA: Insufficient documentation

## 2013-06-25 DIAGNOSIS — Z79899 Other long term (current) drug therapy: Secondary | ICD-10-CM | POA: Diagnosis not present

## 2013-06-25 DIAGNOSIS — Z7901 Long term (current) use of anticoagulants: Secondary | ICD-10-CM | POA: Diagnosis not present

## 2013-06-25 DIAGNOSIS — Z992 Dependence on renal dialysis: Secondary | ICD-10-CM | POA: Insufficient documentation

## 2013-06-25 DIAGNOSIS — D571 Sickle-cell disease without crisis: Secondary | ICD-10-CM | POA: Diagnosis not present

## 2013-06-25 DIAGNOSIS — N186 End stage renal disease: Secondary | ICD-10-CM | POA: Insufficient documentation

## 2013-06-25 DIAGNOSIS — T82598A Other mechanical complication of other cardiac and vascular devices and implants, initial encounter: Secondary | ICD-10-CM

## 2013-06-25 DIAGNOSIS — Z86711 Personal history of pulmonary embolism: Secondary | ICD-10-CM | POA: Diagnosis not present

## 2013-06-25 DIAGNOSIS — D573 Sickle-cell trait: Secondary | ICD-10-CM | POA: Diagnosis not present

## 2013-06-25 DIAGNOSIS — I10 Essential (primary) hypertension: Secondary | ICD-10-CM | POA: Diagnosis not present

## 2013-06-25 HISTORY — PX: REVISION OF ARTERIOVENOUS GORETEX GRAFT: SHX6073

## 2013-06-25 LAB — POCT I-STAT 4, (NA,K, GLUC, HGB,HCT)
GLUCOSE: 93 mg/dL (ref 70–99)
HEMATOCRIT: 33 % — AB (ref 36.0–46.0)
HEMOGLOBIN: 11.2 g/dL — AB (ref 12.0–15.0)
Potassium: 5.1 mEq/L (ref 3.7–5.3)
Sodium: 138 mEq/L (ref 137–147)

## 2013-06-25 LAB — HCG, SERUM, QUALITATIVE: Preg, Serum: NEGATIVE

## 2013-06-25 LAB — PROTIME-INR
INR: 1.07 (ref 0.00–1.49)
Prothrombin Time: 13.7 seconds (ref 11.6–15.2)

## 2013-06-25 SURGERY — REVISION OF ARTERIOVENOUS GORETEX GRAFT
Anesthesia: General | Site: Arm Lower | Laterality: Right

## 2013-06-25 MED ORDER — CHLORHEXIDINE GLUCONATE CLOTH 2 % EX PADS: 6.0000 | MEDICATED_PAD | Freq: Once | CUTANEOUS | Status: DC

## 2013-06-25 MED ORDER — FENTANYL CITRATE 0.05 MG/ML IJ SOLN: 50.0000 ug | Freq: Once | INTRAMUSCULAR | Status: DC

## 2013-06-25 MED ORDER — SODIUM CHLORIDE 0.9 % IV SOLN: INTRAVENOUS | Status: DC

## 2013-06-25 MED ORDER — OXYCODONE-ACETAMINOPHEN 5-325 MG PO TABS
ORAL_TABLET | ORAL | Status: AC
  Filled 2013-06-25: qty 1

## 2013-06-25 MED ORDER — ONDANSETRON HCL 4 MG/2ML IJ SOLN
INTRAMUSCULAR | Status: DC | PRN
  Administered 2013-06-25: 4 mg via INTRAVENOUS

## 2013-06-25 MED ORDER — PHENYLEPHRINE 40 MCG/ML (10ML) SYRINGE FOR IV PUSH (FOR BLOOD PRESSURE SUPPORT)
PREFILLED_SYRINGE | INTRAVENOUS | Status: AC
  Filled 2013-06-25: qty 10

## 2013-06-25 MED ORDER — PROTAMINE SULFATE 10 MG/ML IV SOLN
INTRAVENOUS | Status: DC | PRN
  Administered 2013-06-25: 50 mg via INTRAVENOUS

## 2013-06-25 MED ORDER — ARTIFICIAL TEARS OP OINT
TOPICAL_OINTMENT | OPHTHALMIC | Status: DC | PRN
  Administered 2013-06-25: 1 via OPHTHALMIC

## 2013-06-25 MED ORDER — ARTIFICIAL TEARS OP OINT
TOPICAL_OINTMENT | OPHTHALMIC | Status: AC
  Filled 2013-06-25: qty 3.5

## 2013-06-25 MED ORDER — FENTANYL CITRATE 0.05 MG/ML IJ SOLN
INTRAMUSCULAR | Status: AC
  Filled 2013-06-25: qty 5

## 2013-06-25 MED ORDER — PROPOFOL 10 MG/ML IV BOLUS
INTRAVENOUS | Status: AC
  Filled 2013-06-25: qty 20

## 2013-06-25 MED ORDER — PROPOFOL INFUSION 10 MG/ML OPTIME
INTRAVENOUS | Status: DC | PRN
  Administered 2013-06-25: 25 ug/kg/min via INTRAVENOUS

## 2013-06-25 MED ORDER — MIDAZOLAM HCL 2 MG/2ML IJ SOLN: 1.0000 mg | INTRAMUSCULAR | Status: DC | PRN

## 2013-06-25 MED ORDER — ONDANSETRON HCL 4 MG/2ML IJ SOLN
INTRAMUSCULAR | Status: AC
  Filled 2013-06-25: qty 2

## 2013-06-25 MED ORDER — MIDAZOLAM HCL 2 MG/2ML IJ SOLN
INTRAMUSCULAR | Status: AC
  Filled 2013-06-25: qty 2

## 2013-06-25 MED ORDER — PHENYLEPHRINE HCL 10 MG/ML IJ SOLN
INTRAMUSCULAR | Status: DC | PRN
  Administered 2013-06-25: 40 ug via INTRAVENOUS
  Administered 2013-06-25 (×2): 80 ug via INTRAVENOUS
  Administered 2013-06-25 (×3): 40 ug via INTRAVENOUS
  Administered 2013-06-25: 80 ug via INTRAVENOUS

## 2013-06-25 MED ORDER — OXYCODONE-ACETAMINOPHEN 5-325 MG PO TABS
1.0000 | ORAL_TABLET | Freq: Once | ORAL | Status: AC
  Administered 2013-06-25: 1 via ORAL

## 2013-06-25 MED ORDER — PROPOFOL 10 MG/ML IV BOLUS
INTRAVENOUS | Status: DC | PRN
  Administered 2013-06-25: 50 mg via INTRAVENOUS
  Administered 2013-06-25: 150 mg via INTRAVENOUS
  Administered 2013-06-25 (×2): 50 mg via INTRAVENOUS

## 2013-06-25 MED ORDER — HEPARIN SODIUM (PORCINE) 1000 UNIT/ML IJ SOLN
INTRAMUSCULAR | Status: AC
  Filled 2013-06-25: qty 1

## 2013-06-25 MED ORDER — FENTANYL CITRATE 0.05 MG/ML IJ SOLN
INTRAMUSCULAR | Status: DC | PRN
  Administered 2013-06-25 (×2): 50 ug via INTRAVENOUS
  Administered 2013-06-25: 100 ug via INTRAVENOUS
  Administered 2013-06-25 (×3): 50 ug via INTRAVENOUS

## 2013-06-25 MED ORDER — LIDOCAINE-EPINEPHRINE (PF) 1 %-1:200000 IJ SOLN
INTRAMUSCULAR | Status: DC | PRN
  Administered 2013-06-25: 30 mL

## 2013-06-25 MED ORDER — SODIUM CHLORIDE 0.9 % IV SOLN
INTRAVENOUS | Status: DC | PRN
  Administered 2013-06-25 (×2): via INTRAVENOUS

## 2013-06-25 MED ORDER — HEPARIN SODIUM (PORCINE) 1000 UNIT/ML IJ SOLN
INTRAMUSCULAR | Status: DC | PRN
  Administered 2013-06-25: 6000 [IU] via INTRAVENOUS

## 2013-06-25 MED ORDER — LIDOCAINE HCL (CARDIAC) 20 MG/ML IV SOLN
INTRAVENOUS | Status: DC | PRN
  Administered 2013-06-25: 80 mg via INTRAVENOUS
  Administered 2013-06-25: 20 mg via INTRAVENOUS

## 2013-06-25 MED ORDER — 0.9 % SODIUM CHLORIDE (POUR BTL) OPTIME
TOPICAL | Status: DC | PRN
  Administered 2013-06-25: 1000 mL

## 2013-06-25 MED ORDER — MIDAZOLAM HCL 5 MG/5ML IJ SOLN
INTRAMUSCULAR | Status: DC | PRN
  Administered 2013-06-25: 2 mg via INTRAVENOUS

## 2013-06-25 MED ORDER — HEPARIN SODIUM (PORCINE) 1000 UNIT/ML IJ SOLN
INTRAMUSCULAR | Status: DC | PRN
  Administered 2013-06-25: 2.5 mL

## 2013-06-25 MED ORDER — EPHEDRINE SULFATE 50 MG/ML IJ SOLN
INTRAMUSCULAR | Status: AC
  Filled 2013-06-25: qty 1

## 2013-06-25 MED ORDER — OXYCODONE-ACETAMINOPHEN 5-325 MG PO TABS: 1.0000 | ORAL_TABLET | Freq: Four times a day (QID) | ORAL | Status: DC | PRN

## 2013-06-25 MED ORDER — SODIUM CHLORIDE 0.9 % IR SOLN
Status: DC | PRN
  Administered 2013-06-25: 07:00:00

## 2013-06-25 SURGICAL SUPPLY — 51 items
ADH SKN CLS APL DERMABOND .7 (GAUZE/BANDAGES/DRESSINGS) ×1
CANISTER SUCTION 2500CC (MISCELLANEOUS) ×3 IMPLANT
CATH EMB 4FR 80CM (CATHETERS) ×2 IMPLANT
CATH EMB 5FR 80CM (CATHETERS) ×2 IMPLANT
CLIP TI MEDIUM 6 (CLIP) ×3 IMPLANT
CLIP TI WIDE RED SMALL 6 (CLIP) ×3 IMPLANT
COVER SURGICAL LIGHT HANDLE (MISCELLANEOUS) ×3 IMPLANT
DERMABOND ADVANCED (GAUZE/BANDAGES/DRESSINGS) ×2
DERMABOND ADVANCED .7 DNX12 (GAUZE/BANDAGES/DRESSINGS) ×1 IMPLANT
ELECT REM PT RETURN 9FT ADLT (ELECTROSURGICAL) ×3
ELECTRODE REM PT RTRN 9FT ADLT (ELECTROSURGICAL) ×1 IMPLANT
GEL ULTRASOUND 20GR AQUASONIC (MISCELLANEOUS) ×2 IMPLANT
GLOVE BIO SURGEON STRL SZ7.5 (GLOVE) ×2 IMPLANT
GLOVE BIOGEL PI IND STRL 6.5 (GLOVE) IMPLANT
GLOVE BIOGEL PI IND STRL 7.0 (GLOVE) IMPLANT
GLOVE BIOGEL PI IND STRL 7.5 (GLOVE) ×1 IMPLANT
GLOVE BIOGEL PI INDICATOR 6.5 (GLOVE) ×2
GLOVE BIOGEL PI INDICATOR 7.0 (GLOVE) ×2
GLOVE BIOGEL PI INDICATOR 7.5 (GLOVE) ×8
GLOVE SS BIOGEL STRL SZ 7 (GLOVE) IMPLANT
GLOVE SUPERSENSE BIOGEL SZ 7 (GLOVE) ×2
GLOVE SURG SS PI 7.5 STRL IVOR (GLOVE) ×3 IMPLANT
GOWN STRL REUS W/ TWL LRG LVL3 (GOWN DISPOSABLE) ×2 IMPLANT
GOWN STRL REUS W/ TWL XL LVL3 (GOWN DISPOSABLE) ×1 IMPLANT
GOWN STRL REUS W/TWL LRG LVL3 (GOWN DISPOSABLE) ×3
GOWN STRL REUS W/TWL XL LVL3 (GOWN DISPOSABLE) ×9
GRAFT COLLAGEN VASCULAR 6X15 (Vascular Products) ×2 IMPLANT
GRAFT COLLAGEN VASCULAR 6X19 (Vascular Products) IMPLANT
HEMOSTAT SNOW SURGICEL 2X4 (HEMOSTASIS) IMPLANT
KIT BASIN OR (CUSTOM PROCEDURE TRAY) ×3 IMPLANT
KIT ROOM TURNOVER OR (KITS) ×3 IMPLANT
NDL 18GX1X1/2 (RX/OR ONLY) (NEEDLE) IMPLANT
NDL HYPO 25GX1X1/2 BEV (NEEDLE) ×1 IMPLANT
NEEDLE 18GX1X1/2 (RX/OR ONLY) (NEEDLE) ×3 IMPLANT
NEEDLE HYPO 25GX1X1/2 BEV (NEEDLE) ×3 IMPLANT
NS IRRIG 1000ML POUR BTL (IV SOLUTION) ×3 IMPLANT
PACK CV ACCESS (CUSTOM PROCEDURE TRAY) ×3 IMPLANT
PAD ARMBOARD 7.5X6 YLW CONV (MISCELLANEOUS) ×6 IMPLANT
SUT PROLENE 5 0 C 1 24 (SUTURE) ×2 IMPLANT
SUT PROLENE 5 0 C 1 36 (SUTURE) ×6 IMPLANT
SUT PROLENE 6 0 BV (SUTURE) ×6 IMPLANT
SUT SILK 2 0 SH (SUTURE) ×4 IMPLANT
SUT VIC AB 3-0 SH 27 (SUTURE) ×6
SUT VIC AB 3-0 SH 27X BRD (SUTURE) ×2 IMPLANT
SUT VICRYL 4-0 PS2 18IN ABS (SUTURE) IMPLANT
SYR CONTROL 10ML LL (SYRINGE) ×2 IMPLANT
SYR TOOMEY 50ML (SYRINGE) ×2 IMPLANT
TOWEL OR 17X24 6PK STRL BLUE (TOWEL DISPOSABLE) ×3 IMPLANT
TOWEL OR 17X26 10 PK STRL BLUE (TOWEL DISPOSABLE) ×3 IMPLANT
UNDERPAD 30X30 INCONTINENT (UNDERPADS AND DIAPERS) ×3 IMPLANT
WATER STERILE IRR 1000ML POUR (IV SOLUTION) ×3 IMPLANT

## 2013-06-25 NOTE — Anesthesia Postprocedure Evaluation (Signed)
  Anesthesia Post-op Note  Patient: Kristine Rios  Procedure(s) Performed: Procedure(s): REPLACEMENT OF LATERAL 1/2 OF RIGHT ARTERIOVENOUS BOVINE GRAFT (Right)  Patient Location: PACU  Anesthesia Type:General  Level of Consciousness: awake and alert   Airway and Oxygen Therapy: Patient Spontanous Breathing  Post-op Pain: mild  Post-op Assessment: Post-op Vital signs reviewed, Patient's Cardiovascular Status Stable, Respiratory Function Stable, Patent Airway, No signs of Nausea or vomiting and Pain level controlled  Post-op Vital Signs: Reviewed and stable  Complications: No apparent anesthesia complications
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
## 2013-06-25 NOTE — Preoperative (Signed)
Beta Blockers   Reason not to administer Beta Blockers:Not Applicable 

## 2013-06-25 NOTE — Anesthesia Preprocedure Evaluation (Signed)
Anesthesia Evaluation  Patient identified by MRN, date of birth, ID band Patient awake    Reviewed: Allergy & Precautions, H&P , NPO status , Patient's Chart, lab work & pertinent test results  Airway Mallampati: II  Neck ROM: full    Dental   Pulmonary shortness of breath,  breath sounds clear to auscultation        Cardiovascular hypertension, Rhythm:Regular Rate:Normal     Neuro/Psych    GI/Hepatic   Endo/Other  Morbid obesity  Renal/GU ESRF and DialysisRenal disease     Musculoskeletal   Abdominal (+) + obese,   Peds  Hematology  (+) Blood dyscrasia, Sickle cell trait ,   Anesthesia Other Findings   Reproductive/Obstetrics                           Anesthesia Physical Anesthesia Plan  ASA: III  Anesthesia Plan: General   Post-op Pain Management:    Induction: Intravenous  Airway Management Planned: LMA  Additional Equipment:   Intra-op Plan:   Post-operative Plan: Extubation in OR  Informed Consent: I have reviewed the patients History and Physical, chart, labs and discussed the procedure including the risks, benefits and alternatives for the proposed anesthesia with the patient or authorized representative who has indicated his/her understanding and acceptance.     Plan Discussed with: CRNA and Surgeon  Anesthesia Plan Comments:         Anesthesia Quick Evaluation

## 2013-06-25 NOTE — Interval H&P Note (Signed)
History and Physical Interval Note:  06/25/2013 7:00 AM  Kristine Rios  has presented today for surgery, with the diagnosis of End Stage Renal Disease  The various methods of treatment have been discussed with the patient and family. After consideration of risks, benefits and other options for treatment, the patient has consented to  Procedure(s): REPLACEMENT OF LATERAL 1/2 OF RIGHT ARTERIOVENOUS GORETEX GRAFT (Right) as a surgical intervention .  The patient's history has been reviewed, patient examined, no change in status, stable for surgery.  I have reviewed the patient's chart and labs.  Questions were answered to the patient's satisfaction.     BRABHAM IV, V. WELLS

## 2013-06-25 NOTE — Op Note (Signed)
    Patient name: Randa Lynnrina S Loveridge MRN: [id number] DOB: Oct 23, 1975 Sex: female  06/25/2013 Pre-operative Diagnosis: Degenerating right forearm bovine dialysis graft Post-operative diagnosis:  Same Surgeon:  Jorge NyBRABHAM IV, V. WELLS Assistants:  Narda AmberM.  Collins  Procedure:     replacement of the lateral half of the right forearm graft with bovine carotid Anesthesia:   LMA Blood Loss:  See anesthesia record Specimens:   none   Indications:   the patient has had a right forearm graft placed with bovine carotid given her Gore-Tex allergy.  The medial and lateral half became aneurysmal.  I previously replaced the medial half.  She comes in for replacement of the lateral half  Procedure:  The patient was identified in the holding area and [facility]  The patient was then placed supine on the table. LMA anesthesia was administered.  The patient was prepped and draped in the usual sterile fashion.  A time out was called and antibiotics were administered.   I made 2 incisions proximal and distal to the aneurysmal lateral half of the graft.  Through these incisions the previous bovine carotid graft was exposed and completely mobilized.  I then created a tunnel lateral to the aneurysmal changes for the lateral half of the existing graft.  The patient was heparinized.  After the heparin circulated the existing graft was occluded and then transected at the apex of the graft.  Was to become the defunctionalized limb was oversewn with 5-0 Prolene.  I then prepared the bovine carotid graft on the back table, appropriately washing it.  It was then placed through the tunnel that was previously created.  A end to end  anastomosis was created with running 6-0 Prolene between the old and new graft.  Once this was completed the clamps were released.  Next the incision near the antecubital crease, the graft was occluded proximally and distally and then transected.  The defunctionalized limb was evacuated and then oversewn  with 5-0 Prolene in 2 layers.  The new graft was connected in a end to end fashion with running 5-0 Prolene.  Prior to completion, the appropriate flushing maneuvers were performed.  He is an excellent thrill within the graft after completion.  The heparin was reversed with protamine.  The incisions were closed in 2 layers of 3-0 Vicryl.  Dermabond was applied   Disposition:  To PACU in stable condition.   Juleen ChinaV. Wells Brabham, M.D. [facility] [place] Office: [phone] Pager:  [phone]

## 2013-06-25 NOTE — Transfer of Care (Signed)
Immediate Anesthesia Transfer of Care Note  Patient: Kristine Rios  Procedure(s) Performed: Procedure(s): REPLACEMENT OF LATERAL 1/2 OF RIGHT ARTERIOVENOUS BOVINE GRAFT (Right)  Patient Location: PACU  Anesthesia Type:General  Level of Consciousness: awake, alert , oriented and sedated  Airway & Oxygen Therapy: Patient Spontanous Breathing and Patient connected to nasal cannula oxygen  Post-op Assessment: Report given to PACU RN, Post -op Vital signs reviewed and stable and Patient moving all extremities  Post vital signs: Reviewed and stable  Complications: No apparent anesthesia complications

## 2013-06-28 ENCOUNTER — Encounter (HOSPITAL_COMMUNITY): Payer: Self-pay | Admitting: Surgery

## 2013-07-03 DIAGNOSIS — N186 End stage renal disease: Secondary | ICD-10-CM | POA: Diagnosis not present

## 2013-07-05 DIAGNOSIS — N2581 Secondary hyperparathyroidism of renal origin: Secondary | ICD-10-CM | POA: Diagnosis not present

## 2013-07-05 DIAGNOSIS — I2699 Other pulmonary embolism without acute cor pulmonale: Secondary | ICD-10-CM | POA: Diagnosis not present

## 2013-07-05 DIAGNOSIS — D509 Iron deficiency anemia, unspecified: Secondary | ICD-10-CM | POA: Diagnosis not present

## 2013-07-05 DIAGNOSIS — Z23 Encounter for immunization: Secondary | ICD-10-CM | POA: Diagnosis not present

## 2013-07-05 DIAGNOSIS — N186 End stage renal disease: Secondary | ICD-10-CM | POA: Diagnosis not present

## 2013-07-05 DIAGNOSIS — D631 Anemia in chronic kidney disease: Secondary | ICD-10-CM | POA: Diagnosis not present

## 2013-07-14 DIAGNOSIS — I2699 Other pulmonary embolism without acute cor pulmonale: Secondary | ICD-10-CM | POA: Diagnosis not present

## 2013-07-29 ENCOUNTER — Other Ambulatory Visit (HOSPITAL_COMMUNITY): Payer: Self-pay | Admitting: Nephrology

## 2013-07-29 DIAGNOSIS — T82868A Thrombosis of vascular prosthetic devices, implants and grafts, initial encounter: Secondary | ICD-10-CM

## 2013-07-30 ENCOUNTER — Encounter (HOSPITAL_COMMUNITY): Payer: Self-pay

## 2013-07-30 ENCOUNTER — Other Ambulatory Visit (HOSPITAL_COMMUNITY): Payer: Self-pay | Admitting: Nephrology

## 2013-07-30 ENCOUNTER — Telehealth: Payer: Self-pay

## 2013-07-30 ENCOUNTER — Ambulatory Visit (HOSPITAL_COMMUNITY)
Admission: RE | Admit: 2013-07-30 | Discharge: 2013-07-30 | Disposition: A | Discharge status: Home / Self Care | Payer: Medicare Other | Source: Ambulatory Visit | Attending: Nephrology | Admitting: Nephrology

## 2013-07-30 VITALS — BP 137/75 | HR 113 | Resp 20

## 2013-07-30 DIAGNOSIS — Z86711 Personal history of pulmonary embolism: Secondary | ICD-10-CM | POA: Diagnosis not present

## 2013-07-30 DIAGNOSIS — T82898A Other specified complication of vascular prosthetic devices, implants and grafts, initial encounter: Secondary | ICD-10-CM | POA: Insufficient documentation

## 2013-07-30 DIAGNOSIS — D573 Sickle-cell trait: Secondary | ICD-10-CM | POA: Diagnosis not present

## 2013-07-30 DIAGNOSIS — Y832 Surgical operation with anastomosis, bypass or graft as the cause of abnormal reaction of the patient, or of later complication, without mention of misadventure at the time of the procedure: Secondary | ICD-10-CM | POA: Insufficient documentation

## 2013-07-30 DIAGNOSIS — T82868A Thrombosis of vascular prosthetic devices, implants and grafts, initial encounter: Secondary | ICD-10-CM

## 2013-07-30 DIAGNOSIS — N186 End stage renal disease: Secondary | ICD-10-CM | POA: Insufficient documentation

## 2013-07-30 DIAGNOSIS — Z992 Dependence on renal dialysis: Secondary | ICD-10-CM | POA: Insufficient documentation

## 2013-07-30 DIAGNOSIS — I120 Hypertensive chronic kidney disease with stage 5 chronic kidney disease or end stage renal disease: Secondary | ICD-10-CM | POA: Diagnosis not present

## 2013-07-30 DIAGNOSIS — E669 Obesity, unspecified: Secondary | ICD-10-CM | POA: Diagnosis not present

## 2013-07-30 LAB — PROTIME-INR
INR: 1.23 (ref 0.00–1.49)
Prothrombin Time: 15.2 seconds (ref 11.6–15.2)

## 2013-07-30 LAB — POTASSIUM: POTASSIUM: 5.1 meq/L (ref 3.7–5.3)

## 2013-07-30 MED ORDER — MIDAZOLAM HCL 2 MG/2ML IJ SOLN
INTRAMUSCULAR | Status: DC | PRN
  Administered 2013-07-30: 2 mg via INTRAVENOUS
  Administered 2013-07-30 (×2): 1 mg via INTRAVENOUS
  Administered 2013-07-30: 2 mg via INTRAVENOUS
  Administered 2013-07-30: 1 mg via INTRAVENOUS

## 2013-07-30 MED ORDER — MIDAZOLAM HCL 2 MG/2ML IJ SOLN
INTRAMUSCULAR | Status: AC
  Filled 2013-07-30: qty 6

## 2013-07-30 MED ORDER — FENTANYL CITRATE 0.05 MG/ML IJ SOLN
INTRAMUSCULAR | Status: DC | PRN
  Administered 2013-07-30 (×4): 50 ug via INTRAVENOUS

## 2013-07-30 MED ORDER — IOHEXOL 300 MG/ML  SOLN
150.0000 mL | Freq: Once | INTRAMUSCULAR | Status: AC | PRN
  Administered 2013-07-30: 1 mL via INTRAVENOUS

## 2013-07-30 MED ORDER — FENTANYL CITRATE 0.05 MG/ML IJ SOLN
INTRAMUSCULAR | Status: AC
  Filled 2013-07-30: qty 4

## 2013-07-30 MED ORDER — CEFAZOLIN SODIUM-DEXTROSE 2-3 GM-% IV SOLR
INTRAVENOUS | Status: AC
  Filled 2013-07-30: qty 50

## 2013-07-30 MED ORDER — ALTEPLASE 100 MG IV SOLR
INTRAVENOUS | Status: DC | PRN
  Administered 2013-07-30: 2 mg

## 2013-07-30 MED ORDER — ALTEPLASE 100 MG IV SOLR
2.0000 mg | Freq: Once | INTRAVENOUS | Status: DC
  Filled 2013-07-30 (×2): qty 2

## 2013-07-30 MED ORDER — MIDAZOLAM HCL 2 MG/2ML IJ SOLN
INTRAMUSCULAR | Status: AC
  Filled 2013-07-30: qty 2

## 2013-07-30 MED ORDER — HEPARIN SODIUM (PORCINE) 1000 UNIT/ML IJ SOLN
INTRAMUSCULAR | Status: AC
  Filled 2013-07-30: qty 1

## 2013-07-30 MED ORDER — ALTEPLASE 100 MG IV SOLR: 2.0000 mg | Freq: Once | INTRAVENOUS | Status: DC

## 2013-07-30 MED ORDER — CEFAZOLIN SODIUM-DEXTROSE 2-3 GM-% IV SOLR
2.0000 g | Freq: Once | INTRAVENOUS | Status: AC
  Administered 2013-07-30: 2 g via INTRAVENOUS

## 2013-07-30 NOTE — Sedation Documentation (Signed)
Not

## 2013-07-30 NOTE — Sedation Documentation (Signed)
Started [id number] timeout done earlier, MD requested PT/INR to be drawn before procedure.

## 2013-07-30 NOTE — Sedation Documentation (Signed)
MD not able to get patients graft open; MD consulting nephrologist. My need permanent catheter placed. Pt awake and talking to MD.

## 2013-07-30 NOTE — Sedation Documentation (Signed)
MD talked with pt- she requests perm cath instead of surgery at this time.

## 2013-07-30 NOTE — Procedures (Signed)
LIJV HD cath tip SVC RA No comp

## 2013-07-30 NOTE — Telephone Encounter (Signed)
Evette Cristalavid Z., PA with [facility]  Reported that pt. Has clotted right arm AVG; she went to IR today, and Thrombolysis was attempted/ unsuccessful.  Pt. is requesting to have Dr. Myra GianottiBrabham only to perform surgical Thrombectomy.  Per Dr. Myra GianottiBrabham, need to discuss with MD on call today, as Dr. Myra GianottiBrabham unable to add on to schedule today, and is on vacation week of 08/02/13.   Discussed pt's situation w/ Dr. Darrick PennaFields.  Recommended to schedule for Thrombectomy and Revision of Right arm AVG with poss. Insertion of Dialysis Catheter.  Discussed plan with Evette Cristalavid Z., Renal PA.  Per Onalee Huaavid, pt. is refusing to have any other MD do her surgery, other than Dr. Myra GianottiBrabham.  Jaclyn PrimeAdvised David that with Dr. Myra GianottiBrabham unavailable all next week, the delay in treatment may compromise ability to save the graft.  Per Onalee Huaavid, pt. knows Dr. Myra GianottiBrabham is unavailable next week, and still wants to wait.  Will sched. pt. office visit 08/09/13

## 2013-07-30 NOTE — Sedation Documentation (Signed)
Pt sitting up; waiting on call form nephrologist.  Pt awake, taking, denies pain.  Pts sister updated several times about pt status
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
## 2013-07-30 NOTE — Sedation Documentation (Signed)
Ancef 2GM IV hung per MD order. Perm cath procedure started

## 2013-07-30 NOTE — Sedation Documentation (Signed)
Procedure

## 2013-07-30 NOTE — Sedation Documentation (Signed)
Permanent dialysis cath placed

## 2013-08-03 DIAGNOSIS — N186 End stage renal disease: Secondary | ICD-10-CM | POA: Diagnosis not present

## 2013-08-04 DIAGNOSIS — N186 End stage renal disease: Secondary | ICD-10-CM | POA: Diagnosis not present

## 2013-08-04 DIAGNOSIS — K7689 Other specified diseases of liver: Secondary | ICD-10-CM | POA: Diagnosis not present

## 2013-08-04 DIAGNOSIS — D509 Iron deficiency anemia, unspecified: Secondary | ICD-10-CM | POA: Diagnosis not present

## 2013-08-04 DIAGNOSIS — N2581 Secondary hyperparathyroidism of renal origin: Secondary | ICD-10-CM | POA: Diagnosis not present

## 2013-08-04 DIAGNOSIS — N039 Chronic nephritic syndrome with unspecified morphologic changes: Secondary | ICD-10-CM | POA: Diagnosis not present

## 2013-08-04 DIAGNOSIS — R7309 Other abnormal glucose: Secondary | ICD-10-CM | POA: Diagnosis not present

## 2013-08-04 DIAGNOSIS — D631 Anemia in chronic kidney disease: Secondary | ICD-10-CM | POA: Diagnosis not present

## 2013-08-04 DIAGNOSIS — I2699 Other pulmonary embolism without acute cor pulmonale: Secondary | ICD-10-CM | POA: Diagnosis not present

## 2013-08-05 ENCOUNTER — Encounter: Payer: Self-pay | Admitting: Surgery

## 2013-08-05 ENCOUNTER — Telehealth (HOSPITAL_COMMUNITY): Payer: Self-pay | Admitting: Radiology

## 2013-08-07 DIAGNOSIS — K7689 Other specified diseases of liver: Secondary | ICD-10-CM | POA: Diagnosis not present

## 2013-08-07 DIAGNOSIS — D509 Iron deficiency anemia, unspecified: Secondary | ICD-10-CM | POA: Diagnosis not present

## 2013-08-07 DIAGNOSIS — D631 Anemia in chronic kidney disease: Secondary | ICD-10-CM | POA: Diagnosis not present

## 2013-08-07 DIAGNOSIS — N186 End stage renal disease: Secondary | ICD-10-CM | POA: Diagnosis not present

## 2013-08-07 DIAGNOSIS — N2581 Secondary hyperparathyroidism of renal origin: Secondary | ICD-10-CM | POA: Diagnosis not present

## 2013-08-07 DIAGNOSIS — R7309 Other abnormal glucose: Secondary | ICD-10-CM | POA: Diagnosis not present

## 2013-08-09 ENCOUNTER — Ambulatory Visit (INDEPENDENT_AMBULATORY_CARE_PROVIDER_SITE_OTHER): Payer: Self-pay | Admitting: Surgery

## 2013-08-09 ENCOUNTER — Encounter: Payer: Self-pay | Admitting: Surgery

## 2013-08-09 ENCOUNTER — Encounter (HOSPITAL_COMMUNITY): Payer: Self-pay

## 2013-08-09 ENCOUNTER — Encounter: Payer: Self-pay | Admitting: *Deleted

## 2013-08-09 ENCOUNTER — Other Ambulatory Visit: Payer: Self-pay | Admitting: *Deleted

## 2013-08-09 ENCOUNTER — Encounter (HOSPITAL_COMMUNITY): Payer: Self-pay | Admitting: *Deleted

## 2013-08-09 VITALS — BP 114/71 | HR 92 | Ht 63.0 in | Wt 252.0 lb

## 2013-08-09 DIAGNOSIS — D631 Anemia in chronic kidney disease: Secondary | ICD-10-CM | POA: Diagnosis not present

## 2013-08-09 DIAGNOSIS — N186 End stage renal disease: Secondary | ICD-10-CM | POA: Diagnosis not present

## 2013-08-09 DIAGNOSIS — N2581 Secondary hyperparathyroidism of renal origin: Secondary | ICD-10-CM | POA: Diagnosis not present

## 2013-08-09 DIAGNOSIS — K7689 Other specified diseases of liver: Secondary | ICD-10-CM | POA: Diagnosis not present

## 2013-08-09 DIAGNOSIS — I2699 Other pulmonary embolism without acute cor pulmonale: Secondary | ICD-10-CM | POA: Diagnosis not present

## 2013-08-09 DIAGNOSIS — D509 Iron deficiency anemia, unspecified: Secondary | ICD-10-CM | POA: Diagnosis not present

## 2013-08-09 DIAGNOSIS — R7309 Other abnormal glucose: Secondary | ICD-10-CM | POA: Diagnosis not present

## 2013-08-09 MED ORDER — PROMETHAZINE HCL 25 MG/ML IJ SOLN: 6.2500 mg | INTRAMUSCULAR | Status: DC | PRN

## 2013-08-09 MED ORDER — FENTANYL CITRATE 0.05 MG/ML IJ SOLN: 25.0000 ug | INTRAMUSCULAR | Status: DC | PRN

## 2013-08-09 MED ORDER — OXYCODONE HCL 5 MG PO TABS: 5.0000 mg | ORAL_TABLET | Freq: Once | ORAL | Status: DC | PRN

## 2013-08-09 MED ORDER — OXYCODONE HCL 5 MG/5ML PO SOLN: 5.0000 mg | Freq: Once | ORAL | Status: DC | PRN
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
## 2013-08-10 MED ORDER — DEXTROSE 5 % IV SOLN
1.5000 g | INTRAVENOUS | Status: DC
  Filled 2013-08-10: qty 1.5

## 2013-08-11 ENCOUNTER — Other Ambulatory Visit: Payer: Self-pay

## 2013-08-11 ENCOUNTER — Ambulatory Visit (HOSPITAL_COMMUNITY)
Admission: RE | Admit: 2013-08-11 | Discharge: 2013-08-11 | Disposition: A | Discharge status: Home / Self Care | Payer: Medicare Other | Source: Ambulatory Visit | Attending: Surgery | Admitting: Surgery

## 2013-08-11 ENCOUNTER — Encounter (HOSPITAL_COMMUNITY)
Admission: RE | Disposition: A | Discharge status: Home / Self Care | Payer: Self-pay | Source: Ambulatory Visit | Attending: Surgery

## 2013-08-11 DIAGNOSIS — N186 End stage renal disease: Secondary | ICD-10-CM | POA: Diagnosis not present

## 2013-08-11 DIAGNOSIS — R7309 Other abnormal glucose: Secondary | ICD-10-CM | POA: Diagnosis not present

## 2013-08-11 DIAGNOSIS — D509 Iron deficiency anemia, unspecified: Secondary | ICD-10-CM | POA: Diagnosis not present

## 2013-08-11 DIAGNOSIS — Z538 Procedure and treatment not carried out for other reasons: Secondary | ICD-10-CM | POA: Diagnosis not present

## 2013-08-11 DIAGNOSIS — D631 Anemia in chronic kidney disease: Secondary | ICD-10-CM | POA: Diagnosis not present

## 2013-08-11 DIAGNOSIS — N2581 Secondary hyperparathyroidism of renal origin: Secondary | ICD-10-CM | POA: Diagnosis not present

## 2013-08-11 DIAGNOSIS — I2699 Other pulmonary embolism without acute cor pulmonale: Secondary | ICD-10-CM | POA: Diagnosis not present

## 2013-08-11 DIAGNOSIS — K7689 Other specified diseases of liver: Secondary | ICD-10-CM | POA: Diagnosis not present

## 2013-08-11 LAB — POCT I-STAT 4, (NA,K, GLUC, HGB,HCT)
Glucose, Bld: 101 mg/dL — ABNORMAL HIGH (ref 70–99)
HEMATOCRIT: 35 % — AB (ref 36.0–46.0)
Hemoglobin: 11.9 g/dL — ABNORMAL LOW (ref 12.0–15.0)
Potassium: 4.4 mEq/L (ref 3.7–5.3)
SODIUM: 137 meq/L (ref 137–147)

## 2013-08-11 LAB — PROTIME-INR
INR: 1.64 — AB (ref 0.00–1.49)
Prothrombin Time: 19 seconds — ABNORMAL HIGH (ref 11.6–15.2)

## 2013-08-11 LAB — HCG, SERUM, QUALITATIVE: Preg, Serum: NEGATIVE

## 2013-08-11 LAB — APTT: aPTT: 38 seconds — ABNORMAL HIGH (ref 24–37)

## 2013-08-11 SURGERY — THROMBECTOMY AND REVISION OF ARTERIOVENTOUS (AV) GORETEX  GRAFT
Anesthesia: Monitor Anesthesia Care

## 2013-08-11 MED ORDER — DEXTROSE 5 % IV SOLN
1.5000 g | INTRAVENOUS | Status: AC
  Administered 2013-08-12: 1.5 g via INTRAVENOUS
  Filled 2013-08-11: qty 1.5

## 2013-08-11 MED ORDER — SODIUM CHLORIDE 0.9 % IV SOLN: INTRAVENOUS | Status: DC

## 2013-08-11 MED ORDER — PROPOFOL 10 MG/ML IV BOLUS
INTRAVENOUS | Status: AC
  Filled 2013-08-11: qty 20

## 2013-08-11 MED ORDER — FENTANYL CITRATE 0.05 MG/ML IJ SOLN
INTRAMUSCULAR | Status: AC
  Filled 2013-08-11: qty 5

## 2013-08-11 MED ORDER — CHLORHEXIDINE GLUCONATE CLOTH 2 % EX PADS: 6.0000 | MEDICATED_PAD | Freq: Once | CUTANEOUS | Status: DC

## 2013-08-11 MED ORDER — MIDAZOLAM HCL 2 MG/2ML IJ SOLN
INTRAMUSCULAR | Status: AC
  Filled 2013-08-11: qty 2

## 2013-08-11 SURGICAL SUPPLY — 33 items
ADH SKN CLS APL DERMABOND .7 (GAUZE/BANDAGES/DRESSINGS) ×1
ARMBAND PINK RESTRICT EXTREMIT (MISCELLANEOUS) ×4 IMPLANT
CANISTER SUCTION 2500CC (MISCELLANEOUS) ×4 IMPLANT
CATH EMB 4FR 80CM (CATHETERS) ×4 IMPLANT
CLIP TI MEDIUM 6 (CLIP) ×4 IMPLANT
CLIP TI WIDE RED SMALL 6 (CLIP) ×4 IMPLANT
COVER SURGICAL LIGHT HANDLE (MISCELLANEOUS) ×4 IMPLANT
DERMABOND ADVANCED (GAUZE/BANDAGES/DRESSINGS) ×2
DERMABOND ADVANCED .7 DNX12 (GAUZE/BANDAGES/DRESSINGS) ×2 IMPLANT
ELECT REM PT RETURN 9FT ADLT (ELECTROSURGICAL) ×3
ELECTRODE REM PT RTRN 9FT ADLT (ELECTROSURGICAL) ×2 IMPLANT
GEL ULTRASOUND 20GR AQUASONIC (MISCELLANEOUS) IMPLANT
GLOVE BIOGEL PI IND STRL 7.5 (GLOVE) ×2 IMPLANT
GLOVE BIOGEL PI INDICATOR 7.5 (GLOVE) ×2
GLOVE SURG SS PI 7.5 STRL IVOR (GLOVE) ×4 IMPLANT
GOWN STRL REUS W/ TWL LRG LVL3 (GOWN DISPOSABLE) ×4 IMPLANT
GOWN STRL REUS W/ TWL XL LVL3 (GOWN DISPOSABLE) ×2 IMPLANT
GOWN STRL REUS W/TWL LRG LVL3 (GOWN DISPOSABLE) ×6
GOWN STRL REUS W/TWL XL LVL3 (GOWN DISPOSABLE) ×3
HEMOSTAT SNOW SURGICEL 2X4 (HEMOSTASIS) IMPLANT
KIT BASIN OR (CUSTOM PROCEDURE TRAY) ×4 IMPLANT
KIT ROOM TURNOVER OR (KITS) ×4 IMPLANT
NS IRRIG 1000ML POUR BTL (IV SOLUTION) ×4 IMPLANT
PACK CV ACCESS (CUSTOM PROCEDURE TRAY) ×4 IMPLANT
PAD ARMBOARD 7.5X6 YLW CONV (MISCELLANEOUS) ×8 IMPLANT
SUT PROLENE 6 0 BV (SUTURE) ×4 IMPLANT
SUT VIC AB 3-0 SH 27 (SUTURE) ×3
SUT VIC AB 3-0 SH 27X BRD (SUTURE) ×2 IMPLANT
SUT VICRYL 4-0 PS2 18IN ABS (SUTURE) IMPLANT
TOWEL OR 17X24 6PK STRL BLUE (TOWEL DISPOSABLE) ×4 IMPLANT
TOWEL OR 17X26 10 PK STRL BLUE (TOWEL DISPOSABLE) ×4 IMPLANT
UNDERPAD 30X30 INCONTINENT (UNDERPADS AND DIAPERS) ×4 IMPLANT
WATER STERILE IRR 1000ML POUR (IV SOLUTION) ×4 IMPLANT

## 2013-08-11 NOTE — Progress Notes (Signed)
Pt made aware to now arrive at 7:30 AM for procedure on 08/12/13.

## 2013-08-11 NOTE — Progress Notes (Signed)
Pt made aware of new arrival time on 08/12/13; pt instructed to arrive at 5:30 AM.

## 2013-08-12 ENCOUNTER — Encounter (HOSPITAL_COMMUNITY)
Admission: RE | Disposition: A | Discharge status: Home / Self Care | Payer: Self-pay | Source: Ambulatory Visit | Attending: Vascular Surgery

## 2013-08-12 ENCOUNTER — Ambulatory Visit (HOSPITAL_COMMUNITY): Payer: Medicare Other | Admitting: Certified Registered"

## 2013-08-12 ENCOUNTER — Encounter (HOSPITAL_COMMUNITY): Payer: Self-pay | Admitting: *Deleted

## 2013-08-12 ENCOUNTER — Encounter (HOSPITAL_COMMUNITY): Payer: Medicare Other | Admitting: Certified Registered"

## 2013-08-12 ENCOUNTER — Ambulatory Visit (HOSPITAL_COMMUNITY)
Admission: RE | Admit: 2013-08-12 | Discharge: 2013-08-12 | Disposition: A | Discharge status: Home / Self Care | Payer: Medicare Other | Source: Ambulatory Visit | Attending: Vascular Surgery | Admitting: Vascular Surgery

## 2013-08-12 DIAGNOSIS — Z86711 Personal history of pulmonary embolism: Secondary | ICD-10-CM | POA: Insufficient documentation

## 2013-08-12 DIAGNOSIS — N186 End stage renal disease: Secondary | ICD-10-CM

## 2013-08-12 DIAGNOSIS — Z79899 Other long term (current) drug therapy: Secondary | ICD-10-CM | POA: Diagnosis not present

## 2013-08-12 DIAGNOSIS — I120 Hypertensive chronic kidney disease with stage 5 chronic kidney disease or end stage renal disease: Secondary | ICD-10-CM | POA: Diagnosis not present

## 2013-08-12 DIAGNOSIS — T82898A Other specified complication of vascular prosthetic devices, implants and grafts, initial encounter: Secondary | ICD-10-CM | POA: Insufficient documentation

## 2013-08-12 DIAGNOSIS — Y832 Surgical operation with anastomosis, bypass or graft as the cause of abnormal reaction of the patient, or of later complication, without mention of misadventure at the time of the procedure: Secondary | ICD-10-CM | POA: Insufficient documentation

## 2013-08-12 DIAGNOSIS — D573 Sickle-cell trait: Secondary | ICD-10-CM | POA: Diagnosis not present

## 2013-08-12 DIAGNOSIS — I10 Essential (primary) hypertension: Secondary | ICD-10-CM | POA: Diagnosis not present

## 2013-08-12 DIAGNOSIS — IMO0002 Reserved for concepts with insufficient information to code with codable children: Secondary | ICD-10-CM | POA: Diagnosis not present

## 2013-08-12 HISTORY — PX: THROMBECTOMY AND REVISION OF ARTERIOVENTOUS (AV) GORETEX  GRAFT: SHX6120

## 2013-08-12 LAB — PROTIME-INR
INR: 1.39 (ref 0.00–1.49)
Prothrombin Time: 16.7 seconds — ABNORMAL HIGH (ref 11.6–15.2)

## 2013-08-12 LAB — APTT: aPTT: 37 seconds (ref 24–37)

## 2013-08-12 LAB — POCT I-STAT 4, (NA,K, GLUC, HGB,HCT)
Glucose, Bld: 102 mg/dL — ABNORMAL HIGH (ref 70–99)
HEMATOCRIT: 34 % — AB (ref 36.0–46.0)
Hemoglobin: 11.6 g/dL — ABNORMAL LOW (ref 12.0–15.0)
Potassium: 5.7 mEq/L — ABNORMAL HIGH (ref 3.7–5.3)
Sodium: 137 mEq/L (ref 137–147)

## 2013-08-12 SURGERY — THROMBECTOMY AND REVISION OF ARTERIOVENTOUS (AV) GORETEX  GRAFT
Anesthesia: General | Site: Arm Lower | Laterality: Right

## 2013-08-12 MED ORDER — OXYCODONE HCL 5 MG PO TABS
5.0000 mg | ORAL_TABLET | Freq: Once | ORAL | Status: AC | PRN
  Administered 2013-08-12: 5 mg via ORAL

## 2013-08-12 MED ORDER — SUCCINYLCHOLINE CHLORIDE 20 MG/ML IJ SOLN
INTRAMUSCULAR | Status: DC | PRN
  Administered 2013-08-12: 100 mg via INTRAVENOUS

## 2013-08-12 MED ORDER — PROPOFOL 10 MG/ML IV BOLUS
INTRAVENOUS | Status: DC | PRN
  Administered 2013-08-12: 170 mg via INTRAVENOUS
  Administered 2013-08-12 (×2): 100 mg via INTRAVENOUS
  Administered 2013-08-12: 200 mg via INTRAVENOUS
  Administered 2013-08-12: 100 mg via INTRAVENOUS

## 2013-08-12 MED ORDER — ARTIFICIAL TEARS OP OINT
TOPICAL_OINTMENT | OPHTHALMIC | Status: DC | PRN
  Administered 2013-08-12: 1 via OPHTHALMIC

## 2013-08-12 MED ORDER — SODIUM CHLORIDE 0.9 % IR SOLN
Status: DC | PRN
  Administered 2013-08-12: 14:00:00

## 2013-08-12 MED ORDER — SODIUM CHLORIDE 0.9 % IV SOLN
INTRAVENOUS | Status: DC
  Administered 2013-08-12: 10 mL/h via INTRAVENOUS

## 2013-08-12 MED ORDER — THROMBIN 20000 UNITS EX SOLR
CUTANEOUS | Status: AC
  Filled 2013-08-12: qty 20000

## 2013-08-12 MED ORDER — HEPARIN SODIUM (PORCINE) 1000 UNIT/ML IJ SOLN
INTRAMUSCULAR | Status: DC | PRN
  Administered 2013-08-12: 10000 [IU] via INTRAVENOUS

## 2013-08-12 MED ORDER — PROPOFOL 10 MG/ML IV BOLUS
INTRAVENOUS | Status: AC
  Filled 2013-08-12: qty 20

## 2013-08-12 MED ORDER — FENTANYL CITRATE 0.05 MG/ML IJ SOLN: 25.0000 ug | INTRAMUSCULAR | Status: DC | PRN

## 2013-08-12 MED ORDER — PROTAMINE SULFATE 10 MG/ML IV SOLN
INTRAVENOUS | Status: DC | PRN
  Administered 2013-08-12: 100 mg via INTRAVENOUS

## 2013-08-12 MED ORDER — CHLORHEXIDINE GLUCONATE CLOTH 2 % EX PADS: 6.0000 | MEDICATED_PAD | Freq: Once | CUTANEOUS | Status: DC

## 2013-08-12 MED ORDER — SODIUM CHLORIDE 0.9 % IV SOLN
INTRAVENOUS | Status: DC | PRN
  Administered 2013-08-12 (×2): via INTRAVENOUS

## 2013-08-12 MED ORDER — PHENYLEPHRINE 40 MCG/ML (10ML) SYRINGE FOR IV PUSH (FOR BLOOD PRESSURE SUPPORT)
PREFILLED_SYRINGE | INTRAVENOUS | Status: AC
  Filled 2013-08-12: qty 10

## 2013-08-12 MED ORDER — ONDANSETRON HCL 4 MG/2ML IJ SOLN: 4.0000 mg | Freq: Four times a day (QID) | INTRAMUSCULAR | Status: DC | PRN

## 2013-08-12 MED ORDER — OXYCODONE HCL 5 MG PO TABS: 5.0000 mg | ORAL_TABLET | Freq: Four times a day (QID) | ORAL | Status: DC | PRN

## 2013-08-12 MED ORDER — 0.9 % SODIUM CHLORIDE (POUR BTL) OPTIME
TOPICAL | Status: DC | PRN
  Administered 2013-08-12: 1000 mL

## 2013-08-12 MED ORDER — ONDANSETRON HCL 4 MG/2ML IJ SOLN
INTRAMUSCULAR | Status: DC | PRN
  Administered 2013-08-12: 4 mg via INTRAVENOUS

## 2013-08-12 MED ORDER — FENTANYL CITRATE 0.05 MG/ML IJ SOLN
INTRAMUSCULAR | Status: AC
  Filled 2013-08-12: qty 5

## 2013-08-12 MED ORDER — PHENYLEPHRINE HCL 10 MG/ML IJ SOLN
10.0000 mg | INTRAVENOUS | Status: DC | PRN
  Administered 2013-08-12: 10 ug/min via INTRAVENOUS

## 2013-08-12 MED ORDER — PROTAMINE SULFATE 10 MG/ML IV SOLN
INTRAVENOUS | Status: AC
  Filled 2013-08-12: qty 5

## 2013-08-12 MED ORDER — OXYCODONE HCL 5 MG PO TABS
ORAL_TABLET | ORAL | Status: AC
  Filled 2013-08-12: qty 1

## 2013-08-12 MED ORDER — SODIUM POLYSTYRENE SULFONATE 15 GM/60ML PO SUSP
45.0000 g | Freq: Once | ORAL | Status: DC
  Filled 2013-08-12: qty 180

## 2013-08-12 MED ORDER — MIDAZOLAM HCL 5 MG/5ML IJ SOLN
INTRAMUSCULAR | Status: DC | PRN
  Administered 2013-08-12: 2 mg via INTRAVENOUS

## 2013-08-12 MED ORDER — PHENYLEPHRINE HCL 10 MG/ML IJ SOLN
INTRAMUSCULAR | Status: DC | PRN
  Administered 2013-08-12: 40 ug via INTRAVENOUS
  Administered 2013-08-12 (×2): 120 ug via INTRAVENOUS
  Administered 2013-08-12: 80 ug via INTRAVENOUS
  Administered 2013-08-12: 40 ug via INTRAVENOUS

## 2013-08-12 MED ORDER — LIDOCAINE HCL 1 % IJ SOLN
INTRAMUSCULAR | Status: DC | PRN
  Administered 2013-08-12: 80 mg via INTRADERMAL

## 2013-08-12 MED ORDER — OXYCODONE HCL 5 MG/5ML PO SOLN: 5.0000 mg | Freq: Once | ORAL | Status: AC | PRN

## 2013-08-12 MED ORDER — FENTANYL CITRATE 0.05 MG/ML IJ SOLN
INTRAMUSCULAR | Status: DC | PRN
  Administered 2013-08-12 (×3): 50 ug via INTRAVENOUS
  Administered 2013-08-12: 100 ug via INTRAVENOUS
  Administered 2013-08-12 (×5): 50 ug via INTRAVENOUS

## 2013-08-12 MED ORDER — MIDAZOLAM HCL 2 MG/2ML IJ SOLN
INTRAMUSCULAR | Status: AC
  Filled 2013-08-12: qty 2

## 2013-08-12 MED ORDER — LIDOCAINE HCL (CARDIAC) 20 MG/ML IV SOLN
INTRAVENOUS | Status: AC
  Filled 2013-08-12: qty 5

## 2013-08-12 MED ORDER — SUCCINYLCHOLINE CHLORIDE 20 MG/ML IJ SOLN
INTRAMUSCULAR | Status: AC
  Filled 2013-08-12: qty 1

## 2013-08-12 SURGICAL SUPPLY — 53 items
ADH SKN CLS APL DERMABOND .7 (GAUZE/BANDAGES/DRESSINGS) ×1
ARMBAND PINK RESTRICT EXTREMIT (MISCELLANEOUS) ×2 IMPLANT
BANDAGE ESMARK 6X9 LF (GAUZE/BANDAGES/DRESSINGS) IMPLANT
BNDG CMPR 9X6 STRL LF SNTH (GAUZE/BANDAGES/DRESSINGS) ×1
BNDG ESMARK 6X9 LF (GAUZE/BANDAGES/DRESSINGS) ×2
CANISTER SUCTION 2500CC (MISCELLANEOUS) ×2 IMPLANT
CATH EMB 4FR 80CM (CATHETERS) ×2 IMPLANT
CLIP TI MEDIUM 6 (CLIP) ×2 IMPLANT
CLIP TI WIDE RED SMALL 6 (CLIP) ×2 IMPLANT
COVER SURGICAL LIGHT HANDLE (MISCELLANEOUS) ×2 IMPLANT
CUFF TOURNIQUET SINGLE 24IN (TOURNIQUET CUFF) ×1 IMPLANT
DECANTER SPIKE VIAL GLASS SM (MISCELLANEOUS) ×2 IMPLANT
DERMABOND ADVANCED (GAUZE/BANDAGES/DRESSINGS) ×1
DERMABOND ADVANCED .7 DNX12 (GAUZE/BANDAGES/DRESSINGS) ×1 IMPLANT
DRAPE ORTHO SPLIT 77X108 STRL (DRAPES) ×2
DRAPE SURG ORHT 6 SPLT 77X108 (DRAPES) IMPLANT
DRAPE X-RAY CASS 24X20 (DRAPES) IMPLANT
DRSG OPSITE 4X5.5 SM (GAUZE/BANDAGES/DRESSINGS) ×1 IMPLANT
ELECT CAUTERY BLADE 6.4 (BLADE) ×1 IMPLANT
ELECT REM PT RETURN 9FT ADLT (ELECTROSURGICAL) ×2
ELECTRODE REM PT RTRN 9FT ADLT (ELECTROSURGICAL) ×1 IMPLANT
GEL ULTRASOUND 20GR AQUASONIC (MISCELLANEOUS) ×1 IMPLANT
GLOVE BIO SURGEON STRL SZ7.5 (GLOVE) ×3 IMPLANT
GLOVE BIOGEL M 6.5 STRL (GLOVE) ×4 IMPLANT
GLOVE BIOGEL PI IND STRL 6.5 (GLOVE) IMPLANT
GLOVE BIOGEL PI IND STRL 7.0 (GLOVE) IMPLANT
GLOVE BIOGEL PI INDICATOR 6.5 (GLOVE) ×3
GLOVE BIOGEL PI INDICATOR 7.0 (GLOVE) ×2
GLOVE ECLIPSE 6.5 STRL STRAW (GLOVE) ×2 IMPLANT
GOWN STRL REUS W/ TWL LRG LVL3 (GOWN DISPOSABLE) ×3 IMPLANT
GOWN STRL REUS W/TWL LRG LVL3 (GOWN DISPOSABLE) ×10
KIT BASIN OR (CUSTOM PROCEDURE TRAY) ×2 IMPLANT
KIT ROOM TURNOVER OR (KITS) ×2 IMPLANT
LOOP VESSEL MINI RED (MISCELLANEOUS) IMPLANT
NS IRRIG 1000ML POUR BTL (IV SOLUTION) ×2 IMPLANT
PACK CV ACCESS (CUSTOM PROCEDURE TRAY) ×2 IMPLANT
PAD ARMBOARD 7.5X6 YLW CONV (MISCELLANEOUS) ×4 IMPLANT
PADDING CAST COTTON 6X4 STRL (CAST SUPPLIES) ×1 IMPLANT
PENCIL BUTTON HOLSTER BLD 10FT (ELECTRODE) ×1 IMPLANT
SET COLLECT BLD 21X3/4 12 (NEEDLE) IMPLANT
SPONGE SURGIFOAM ABS GEL 100 (HEMOSTASIS) IMPLANT
STOCKINETTE TUBULAR 6 INCH (GAUZE/BANDAGES/DRESSINGS) ×1 IMPLANT
STOPCOCK 4 WAY LG BORE MALE ST (IV SETS) IMPLANT
SUT PROLENE 6 0 CC (SUTURE) ×8 IMPLANT
SUT VIC AB 3-0 SH 27 (SUTURE) ×4
SUT VIC AB 3-0 SH 27X BRD (SUTURE) ×1 IMPLANT
SUT VICRYL 4-0 PS2 18IN ABS (SUTURE) ×2 IMPLANT
TOWEL OR 17X24 6PK STRL BLUE (TOWEL DISPOSABLE) ×2 IMPLANT
TOWEL OR 17X26 10 PK STRL BLUE (TOWEL DISPOSABLE) ×3 IMPLANT
TUBE CONNECTING 12X1/4 (SUCTIONS) ×1 IMPLANT
TUBING EXTENTION W/L.L. (IV SETS) IMPLANT
UNDERPAD 30X30 INCONTINENT (UNDERPADS AND DIAPERS) ×2 IMPLANT
WATER STERILE IRR 1000ML POUR (IV SOLUTION) ×2 IMPLANT

## 2013-08-12 NOTE — Transfer of Care (Signed)
Immediate Anesthesia Transfer of Care Note  Patient: Kristine Rios  Procedure(s) Performed: Procedure(s) with comments: ATTEMPTED THROMBECTOMY  OF ARTERIOVENOUS (AV) BOVINE DIALYSIS  GRAFT RIGHT FOREARM & REPAIR OF BRACHIAL ARTERY (Right) - DR. FIELDS MAY PERFORM PROCEDURE  Patient Location: PACU  Anesthesia Type:General  Level of Consciousness: awake, alert , oriented and patient cooperative  Airway & Oxygen Therapy: Patient Spontanous Breathing and Patient connected to nasal cannula oxygen  Post-op Assessment: Report given to PACU RN, Post -op Vital signs reviewed and stable and Patient moving all extremities  Post vital signs: Reviewed and stable  Complications: No apparent anesthesia complications

## 2013-08-12 NOTE — Anesthesia Procedure Notes (Addendum)
Procedure Name: LMA Insertion and Intubation Date/Time: 08/12/2013 2:45 PM Performed by: Angelica PouSMITH, Melaney Tellefsen PIZZICARA Pre-anesthesia Checklist: Patient identified, Timeout performed, Emergency Drugs available, Suction available and Patient being monitored Patient Re-evaluated:Patient Re-evaluated prior to inductionOxygen Delivery Method: Circle system utilized Preoxygenation: Pre-oxygenation with 100% oxygen Intubation Type: IV induction Ventilation: Mask ventilation without difficulty LMA: LMA inserted LMA Size: 4.0 Laryngoscope Size: Mac and 4 Tube size: 7.5 mm Number of attempts: 2 Airway Equipment and Method: Stylet Placement Confirmation: positive ETCO2,  breath sounds checked- equal and bilateral and ETT inserted through vocal cords under direct vision Secured at: 24 cm Tube secured with: Tape Dental Injury: Teeth and Oropharynx as per pre-operative assessment  Comments: Initial easy LMA #4 insertion by [employer]	[place] CRNA. Unable to blunt pt coughing reflex upon incision.  Converted to ETT using Mac 4 by Dr Jacklynn BueMassagee.

## 2013-08-12 NOTE — Progress Notes (Signed)
Dr Darrick PennaFields here to speak w/pt, unable to open R AVG
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
## 2013-08-12 NOTE — Op Note (Signed)
Procedure: Attempted thrombectomy right forearm AV graft, repair right brachial artery  Preoperative diagnosis: Thrombosed right forearm AV graft  Postoperative diagnosis: Same  Anesthesia: Gen.  Assistant: Doreatha Massed PA-C  Operative findings: #1 total obliteration of the outflow vein with venous intimal hyperplasia and thrombus #2 repair of right brachial artery with interrupted 6-0 Prolene sutures  Indications: Patient is a 38 year old female who is status post placement of her right forearm AV graft with bovine carotid artery. Her graft occluded 2 weeks ago. She initially refused thrombectomy. She has now consented thrombectomy 2 weeks later. It was discussed with the patient preoperatively that the graft may not be salvageable. The patient apparently had a Gore-Tex reaction in the past and has refused placement of further PTFE material. It had been discussed with the patient preoperatively that if we were unable to thrombectomize the graft and repair it that she would need to return for an upper arm graft with an additional bovine carotid graft  Operative details: After obtaining informed consent, the patient was taken to the operating room. The patient was placed in supine position the operating room table. After induction of general anesthesia and placement of a laryngeal mask, the patient's entire right upper extremity was prepped and draped in the usual sterile fashion. Next a longitudinal incision was made her pre-existing scar near the antecubital area. After making this initial skin incision, the patient began to cough severely. She began to have decreased in her oxygen saturations. At this point the anesthesia team placed an endotracheal tube. During the course of placing the endotracheal tube, the sterile field was compromised on several occasions. After placement of the endotracheal tube, the entire right upper extremity was again prepped and draped after covering the incision with an  OpSite.  Next the venous limb of the graft was dissected free circumferentially. It was very well incorporated and densely scarred.  Dissection was carried up above the level the venous anastomosis. The outflow vein appeared very small 2 mm or less.. During the course of dissection of the more distal vein the lateral wall of the brachial artery was inadvertently injured.  A tourniquet was placed in the upper arm for vascular control. The tourniquet was then inflated to 300 millimeters of mercury. 3 interrupted 6-0 Prolene sutures were used to repair the brachial artery. The tourniquet was deflated after approximately 10-15 minutes. There is good hemostasis at this point. I then proceeded to dissect out the remainder of the venous anastomosis and the proximal graft and outflow vein. The patient was then given 10,000 units of intravenous heparin. After 2 minutes of circulation time, the transverse graftotomy was made just above the level the venous anastomosis. There was chronic adherent clot within the graft. This had become fixed to the wall of the vein. The lumen of the vein was essentially totally obliterated. I was unable to pass the Fogarty catheter through the vein. There was less than 1 mm of lumen remaining. A plug of thrombus continued to extend several centimeters further up the vein with no endpoint in site. At this point it was decided to abandon the attempt at thrombectomy. The vein was oversewn with a 6-0 Prolene suture. The proximal graft was ligated with a 2-0 silk suture in 2 separate locations. Hemostasis was obtained. The patient was given 100 mg of protamine. The subcutaneous tissues were reapproximated using a running 3-0 Vicryl suture. The skin was closed with a 4 Vicryl subcuticular stitch. Dermabond was applied. The patient tolerated the procedure  well and there were no complications. The patient was taken to the recovery room in stable condition. The instrument sponge and needle count was  correct at the end of the case.  Fabienne Brunsharles Fields, MD [facility] [place] Office: [phone] Pager: [phone]

## 2013-08-12 NOTE — Anesthesia Postprocedure Evaluation (Signed)
  Anesthesia Post-op Note  Patient: Kristine Rios  Procedure(s) Performed: Procedure(s) with comments: ATTEMPTED THROMBECTOMY  OF ARTERIOVENOUS (AV) BOVINE DIALYSIS  GRAFT RIGHT FOREARM & REPAIR OF BRACHIAL ARTERY (Right) - DR. FIELDS MAY PERFORM PROCEDURE  Patient Location: PACU  Anesthesia Type:General  Level of Consciousness: awake and alert   Airway and Oxygen Therapy: Patient Spontanous Breathing  Post-op Pain: mild  Post-op Assessment: Post-op Vital signs reviewed  Post-op Vital Signs: stable  Last Vitals:  Filed Vitals:   08/12/13 1731  BP: 99/54  Pulse: 109  Temp:   Resp: 15    Complications: No apparent anesthesia complications

## 2013-08-12 NOTE — Progress Notes (Signed)
PA Bard HerbertMarty Bergman aware pt here and AVG was not able to be opened. Pt for HD tomorrow at noon, potassium is 5.7  New order for pt to take Kayexelate when she gets home, and she verbalizes understanding. Denies pain and does not wish pain med at this time.

## 2013-08-12 NOTE — Progress Notes (Signed)
Spoke with Dr. Evelina DunField's re: patient's allergy to Gortex and he stated they would not be using that and no plastic will be used.

## 2013-08-12 NOTE — Anesthesia Preprocedure Evaluation (Addendum)
Anesthesia Evaluation  Patient identified by MRN, date of birth, ID band Patient awake    Reviewed: Allergy & Precautions, H&P , NPO status , Patient's Chart, lab work & pertinent test results  Airway Mallampati: I  Neck ROM: Full    Dental  (+) Teeth Intact, Dental Advisory Given   Pulmonary  breath sounds clear to auscultation        Cardiovascular hypertension, Rhythm:Regular Rate:Normal     Neuro/Psych    GI/Hepatic   Endo/Other  Morbid obesity  Renal/GU ESRF and DialysisRenal disease     Musculoskeletal   Abdominal   Peds  Hematology  (+) Blood dyscrasia, Sickle cell trait ,   Anesthesia Other Findings   Reproductive/Obstetrics                          Anesthesia Physical Anesthesia Plan  ASA: III  Anesthesia Plan: General   Post-op Pain Management:    Induction: Intravenous  Airway Management Planned: LMA  Additional Equipment:   Intra-op Plan:   Post-operative Plan:   Informed Consent: I have reviewed the patients History and Physical, chart, labs and discussed the procedure including the risks, benefits and alternatives for the proposed anesthesia with the patient or authorized representative who has indicated his/her understanding and acceptance.     Plan Discussed with: CRNA, Anesthesiologist and Surgeon  Anesthesia Plan Comments:         Anesthesia Quick Evaluation

## 2013-08-12 NOTE — Discharge Instructions (Signed)

## 2013-08-13 ENCOUNTER — Telehealth: Payer: Self-pay | Admitting: Vascular Surgery

## 2013-08-13 NOTE — Telephone Encounter (Addendum)
Message copied by Fredrich BirksMILLIKAN, DANA P on Fri Aug 13, 2013  1:50 PM ------      Message from: Lorin MercyMCCHESNEY, MARILYN K      Created: Thu Aug 12, 2013  5:07 PM      Regarding: Schedule                   ----- Message -----         From: Dara LordsSamantha J Rhyne, PA-C         Sent: 08/12/2013   4:27 PM           To: [facility] Charge Pool            S/p attempted thrombectomy of bovine AVG and repair of brachial artery on 08/12/13 by Dr. Darrick PennaFields.  F/u with Dr. Myra GianottiBrabham in 2 weeks ([facility] patient). ------  08/13/13: pt disconnected in middle of conversation then phone was busy- mailed letter, dpm

## 2013-08-14 DIAGNOSIS — N186 End stage renal disease: Secondary | ICD-10-CM | POA: Diagnosis not present

## 2013-08-14 DIAGNOSIS — K7689 Other specified diseases of liver: Secondary | ICD-10-CM | POA: Diagnosis not present

## 2013-08-14 DIAGNOSIS — D509 Iron deficiency anemia, unspecified: Secondary | ICD-10-CM | POA: Diagnosis not present

## 2013-08-14 DIAGNOSIS — N039 Chronic nephritic syndrome with unspecified morphologic changes: Secondary | ICD-10-CM | POA: Diagnosis not present

## 2013-08-14 DIAGNOSIS — D631 Anemia in chronic kidney disease: Secondary | ICD-10-CM | POA: Diagnosis not present

## 2013-08-14 DIAGNOSIS — R7309 Other abnormal glucose: Secondary | ICD-10-CM | POA: Diagnosis not present

## 2013-08-14 DIAGNOSIS — N2581 Secondary hyperparathyroidism of renal origin: Secondary | ICD-10-CM | POA: Diagnosis not present

## 2013-08-16 ENCOUNTER — Encounter (HOSPITAL_COMMUNITY): Payer: Self-pay | Admitting: Vascular Surgery

## 2013-08-16 DIAGNOSIS — D509 Iron deficiency anemia, unspecified: Secondary | ICD-10-CM | POA: Diagnosis not present

## 2013-08-16 DIAGNOSIS — K7689 Other specified diseases of liver: Secondary | ICD-10-CM | POA: Diagnosis not present

## 2013-08-16 DIAGNOSIS — D631 Anemia in chronic kidney disease: Secondary | ICD-10-CM | POA: Diagnosis not present

## 2013-08-16 DIAGNOSIS — N186 End stage renal disease: Secondary | ICD-10-CM | POA: Diagnosis not present

## 2013-08-16 DIAGNOSIS — N2581 Secondary hyperparathyroidism of renal origin: Secondary | ICD-10-CM | POA: Diagnosis not present

## 2013-08-16 DIAGNOSIS — R7309 Other abnormal glucose: Secondary | ICD-10-CM | POA: Diagnosis not present

## 2013-08-18 DIAGNOSIS — I2699 Other pulmonary embolism without acute cor pulmonale: Secondary | ICD-10-CM | POA: Diagnosis not present

## 2013-08-18 DIAGNOSIS — N186 End stage renal disease: Secondary | ICD-10-CM | POA: Diagnosis not present

## 2013-08-18 DIAGNOSIS — R7309 Other abnormal glucose: Secondary | ICD-10-CM | POA: Diagnosis not present

## 2013-08-18 DIAGNOSIS — D509 Iron deficiency anemia, unspecified: Secondary | ICD-10-CM | POA: Diagnosis not present

## 2013-08-18 DIAGNOSIS — N2581 Secondary hyperparathyroidism of renal origin: Secondary | ICD-10-CM | POA: Diagnosis not present

## 2013-08-18 DIAGNOSIS — N039 Chronic nephritic syndrome with unspecified morphologic changes: Secondary | ICD-10-CM | POA: Diagnosis not present

## 2013-08-18 DIAGNOSIS — D631 Anemia in chronic kidney disease: Secondary | ICD-10-CM | POA: Diagnosis not present

## 2013-08-18 DIAGNOSIS — D689 Coagulation defect, unspecified: Secondary | ICD-10-CM | POA: Diagnosis not present

## 2013-08-18 DIAGNOSIS — K7689 Other specified diseases of liver: Secondary | ICD-10-CM | POA: Diagnosis not present

## 2013-08-20 DIAGNOSIS — D631 Anemia in chronic kidney disease: Secondary | ICD-10-CM | POA: Diagnosis not present

## 2013-08-20 DIAGNOSIS — R7309 Other abnormal glucose: Secondary | ICD-10-CM | POA: Diagnosis not present

## 2013-08-20 DIAGNOSIS — K7689 Other specified diseases of liver: Secondary | ICD-10-CM | POA: Diagnosis not present

## 2013-08-20 DIAGNOSIS — D509 Iron deficiency anemia, unspecified: Secondary | ICD-10-CM | POA: Diagnosis not present

## 2013-08-20 DIAGNOSIS — N186 End stage renal disease: Secondary | ICD-10-CM | POA: Diagnosis not present

## 2013-08-20 DIAGNOSIS — N2581 Secondary hyperparathyroidism of renal origin: Secondary | ICD-10-CM | POA: Diagnosis not present

## 2013-08-23 DIAGNOSIS — K7689 Other specified diseases of liver: Secondary | ICD-10-CM | POA: Diagnosis not present

## 2013-08-23 DIAGNOSIS — N186 End stage renal disease: Secondary | ICD-10-CM | POA: Diagnosis not present

## 2013-08-23 DIAGNOSIS — N2581 Secondary hyperparathyroidism of renal origin: Secondary | ICD-10-CM | POA: Diagnosis not present

## 2013-08-23 DIAGNOSIS — D509 Iron deficiency anemia, unspecified: Secondary | ICD-10-CM | POA: Diagnosis not present

## 2013-08-23 DIAGNOSIS — N039 Chronic nephritic syndrome with unspecified morphologic changes: Secondary | ICD-10-CM | POA: Diagnosis not present

## 2013-08-23 DIAGNOSIS — R7309 Other abnormal glucose: Secondary | ICD-10-CM | POA: Diagnosis not present

## 2013-08-23 DIAGNOSIS — D631 Anemia in chronic kidney disease: Secondary | ICD-10-CM | POA: Diagnosis not present

## 2013-08-25 DIAGNOSIS — N2581 Secondary hyperparathyroidism of renal origin: Secondary | ICD-10-CM | POA: Diagnosis not present

## 2013-08-25 DIAGNOSIS — D631 Anemia in chronic kidney disease: Secondary | ICD-10-CM | POA: Diagnosis not present

## 2013-08-25 DIAGNOSIS — N039 Chronic nephritic syndrome with unspecified morphologic changes: Secondary | ICD-10-CM | POA: Diagnosis not present

## 2013-08-25 DIAGNOSIS — R7309 Other abnormal glucose: Secondary | ICD-10-CM | POA: Diagnosis not present

## 2013-08-25 DIAGNOSIS — N186 End stage renal disease: Secondary | ICD-10-CM | POA: Diagnosis not present

## 2013-08-25 DIAGNOSIS — D509 Iron deficiency anemia, unspecified: Secondary | ICD-10-CM | POA: Diagnosis not present

## 2013-08-25 DIAGNOSIS — K7689 Other specified diseases of liver: Secondary | ICD-10-CM | POA: Diagnosis not present

## 2013-08-27 ENCOUNTER — Encounter: Payer: Self-pay | Admitting: Surgery

## 2013-08-28 DIAGNOSIS — D631 Anemia in chronic kidney disease: Secondary | ICD-10-CM | POA: Diagnosis not present

## 2013-08-28 DIAGNOSIS — K7689 Other specified diseases of liver: Secondary | ICD-10-CM | POA: Diagnosis not present

## 2013-08-28 DIAGNOSIS — N2581 Secondary hyperparathyroidism of renal origin: Secondary | ICD-10-CM | POA: Diagnosis not present

## 2013-08-28 DIAGNOSIS — D509 Iron deficiency anemia, unspecified: Secondary | ICD-10-CM | POA: Diagnosis not present

## 2013-08-28 DIAGNOSIS — N039 Chronic nephritic syndrome with unspecified morphologic changes: Secondary | ICD-10-CM | POA: Diagnosis not present

## 2013-08-28 DIAGNOSIS — R7309 Other abnormal glucose: Secondary | ICD-10-CM | POA: Diagnosis not present

## 2013-08-28 DIAGNOSIS — N186 End stage renal disease: Secondary | ICD-10-CM | POA: Diagnosis not present

## 2013-08-30 ENCOUNTER — Encounter: Payer: Medicare Other | Admitting: Surgery

## 2013-08-30 DIAGNOSIS — K7689 Other specified diseases of liver: Secondary | ICD-10-CM | POA: Diagnosis not present

## 2013-08-30 DIAGNOSIS — D631 Anemia in chronic kidney disease: Secondary | ICD-10-CM | POA: Diagnosis not present

## 2013-08-30 DIAGNOSIS — I2699 Other pulmonary embolism without acute cor pulmonale: Secondary | ICD-10-CM | POA: Diagnosis not present

## 2013-08-30 DIAGNOSIS — R7309 Other abnormal glucose: Secondary | ICD-10-CM | POA: Diagnosis not present

## 2013-08-30 DIAGNOSIS — N2581 Secondary hyperparathyroidism of renal origin: Secondary | ICD-10-CM | POA: Diagnosis not present

## 2013-08-30 DIAGNOSIS — N186 End stage renal disease: Secondary | ICD-10-CM | POA: Diagnosis not present

## 2013-08-30 DIAGNOSIS — D509 Iron deficiency anemia, unspecified: Secondary | ICD-10-CM | POA: Diagnosis not present

## 2013-09-01 DIAGNOSIS — R7309 Other abnormal glucose: Secondary | ICD-10-CM | POA: Diagnosis not present

## 2013-09-01 DIAGNOSIS — N2581 Secondary hyperparathyroidism of renal origin: Secondary | ICD-10-CM | POA: Diagnosis not present

## 2013-09-01 DIAGNOSIS — D631 Anemia in chronic kidney disease: Secondary | ICD-10-CM | POA: Diagnosis not present

## 2013-09-01 DIAGNOSIS — D509 Iron deficiency anemia, unspecified: Secondary | ICD-10-CM | POA: Diagnosis not present

## 2013-09-01 DIAGNOSIS — K7689 Other specified diseases of liver: Secondary | ICD-10-CM | POA: Diagnosis not present

## 2013-09-01 DIAGNOSIS — N186 End stage renal disease: Secondary | ICD-10-CM | POA: Diagnosis not present

## 2013-09-02 DIAGNOSIS — N186 End stage renal disease: Secondary | ICD-10-CM | POA: Diagnosis not present

## 2013-09-03 DIAGNOSIS — K7689 Other specified diseases of liver: Secondary | ICD-10-CM | POA: Diagnosis not present

## 2013-09-03 DIAGNOSIS — D509 Iron deficiency anemia, unspecified: Secondary | ICD-10-CM | POA: Diagnosis not present

## 2013-09-03 DIAGNOSIS — R7309 Other abnormal glucose: Secondary | ICD-10-CM | POA: Diagnosis not present

## 2013-09-03 DIAGNOSIS — N2581 Secondary hyperparathyroidism of renal origin: Secondary | ICD-10-CM | POA: Diagnosis not present

## 2013-09-03 DIAGNOSIS — N186 End stage renal disease: Secondary | ICD-10-CM | POA: Diagnosis not present

## 2013-09-03 DIAGNOSIS — D631 Anemia in chronic kidney disease: Secondary | ICD-10-CM | POA: Diagnosis not present

## 2013-09-15 DIAGNOSIS — I2699 Other pulmonary embolism without acute cor pulmonale: Secondary | ICD-10-CM | POA: Diagnosis not present

## 2013-09-17 ENCOUNTER — Encounter: Payer: Self-pay | Admitting: Surgery

## 2013-09-20 ENCOUNTER — Other Ambulatory Visit: Payer: Self-pay

## 2013-09-20 ENCOUNTER — Encounter: Payer: Self-pay | Admitting: Surgery

## 2013-09-20 ENCOUNTER — Ambulatory Visit (INDEPENDENT_AMBULATORY_CARE_PROVIDER_SITE_OTHER): Payer: Self-pay | Admitting: Surgery

## 2013-09-20 VITALS — BP 129/76 | HR 85 | Ht 63.0 in | Wt 265.0 lb

## 2013-09-20 DIAGNOSIS — N186 End stage renal disease: Secondary | ICD-10-CM
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
## 2013-10-03 DIAGNOSIS — N186 End stage renal disease: Secondary | ICD-10-CM | POA: Diagnosis not present

## 2013-10-04 DIAGNOSIS — K7689 Other specified diseases of liver: Secondary | ICD-10-CM | POA: Diagnosis not present

## 2013-10-04 DIAGNOSIS — D509 Iron deficiency anemia, unspecified: Secondary | ICD-10-CM | POA: Diagnosis not present

## 2013-10-04 DIAGNOSIS — D631 Anemia in chronic kidney disease: Secondary | ICD-10-CM | POA: Diagnosis not present

## 2013-10-04 DIAGNOSIS — N2581 Secondary hyperparathyroidism of renal origin: Secondary | ICD-10-CM | POA: Diagnosis not present

## 2013-10-04 DIAGNOSIS — N039 Chronic nephritic syndrome with unspecified morphologic changes: Secondary | ICD-10-CM | POA: Diagnosis not present

## 2013-10-04 DIAGNOSIS — R7309 Other abnormal glucose: Secondary | ICD-10-CM | POA: Diagnosis not present

## 2013-10-04 DIAGNOSIS — N186 End stage renal disease: Secondary | ICD-10-CM | POA: Diagnosis not present

## 2013-10-13 DIAGNOSIS — I2699 Other pulmonary embolism without acute cor pulmonale: Secondary | ICD-10-CM | POA: Diagnosis not present

## 2013-10-14 ENCOUNTER — Encounter (HOSPITAL_COMMUNITY): Payer: Self-pay | Admitting: Pharmacy Technician

## 2013-10-19 DIAGNOSIS — D689 Coagulation defect, unspecified: Secondary | ICD-10-CM | POA: Diagnosis not present

## 2013-10-20 ENCOUNTER — Encounter (HOSPITAL_COMMUNITY): Payer: Self-pay | Admitting: *Deleted

## 2013-10-20 IMAGING — CR DG CHEST 1V PORT
1 series · 1 of 1 positions shown · non-contrast
Comparison: 05/03/2010

CLINICAL DATA: Status post diatek catheter placement.

PORTABLE CHEST - 1 VIEW

[view not recorded]
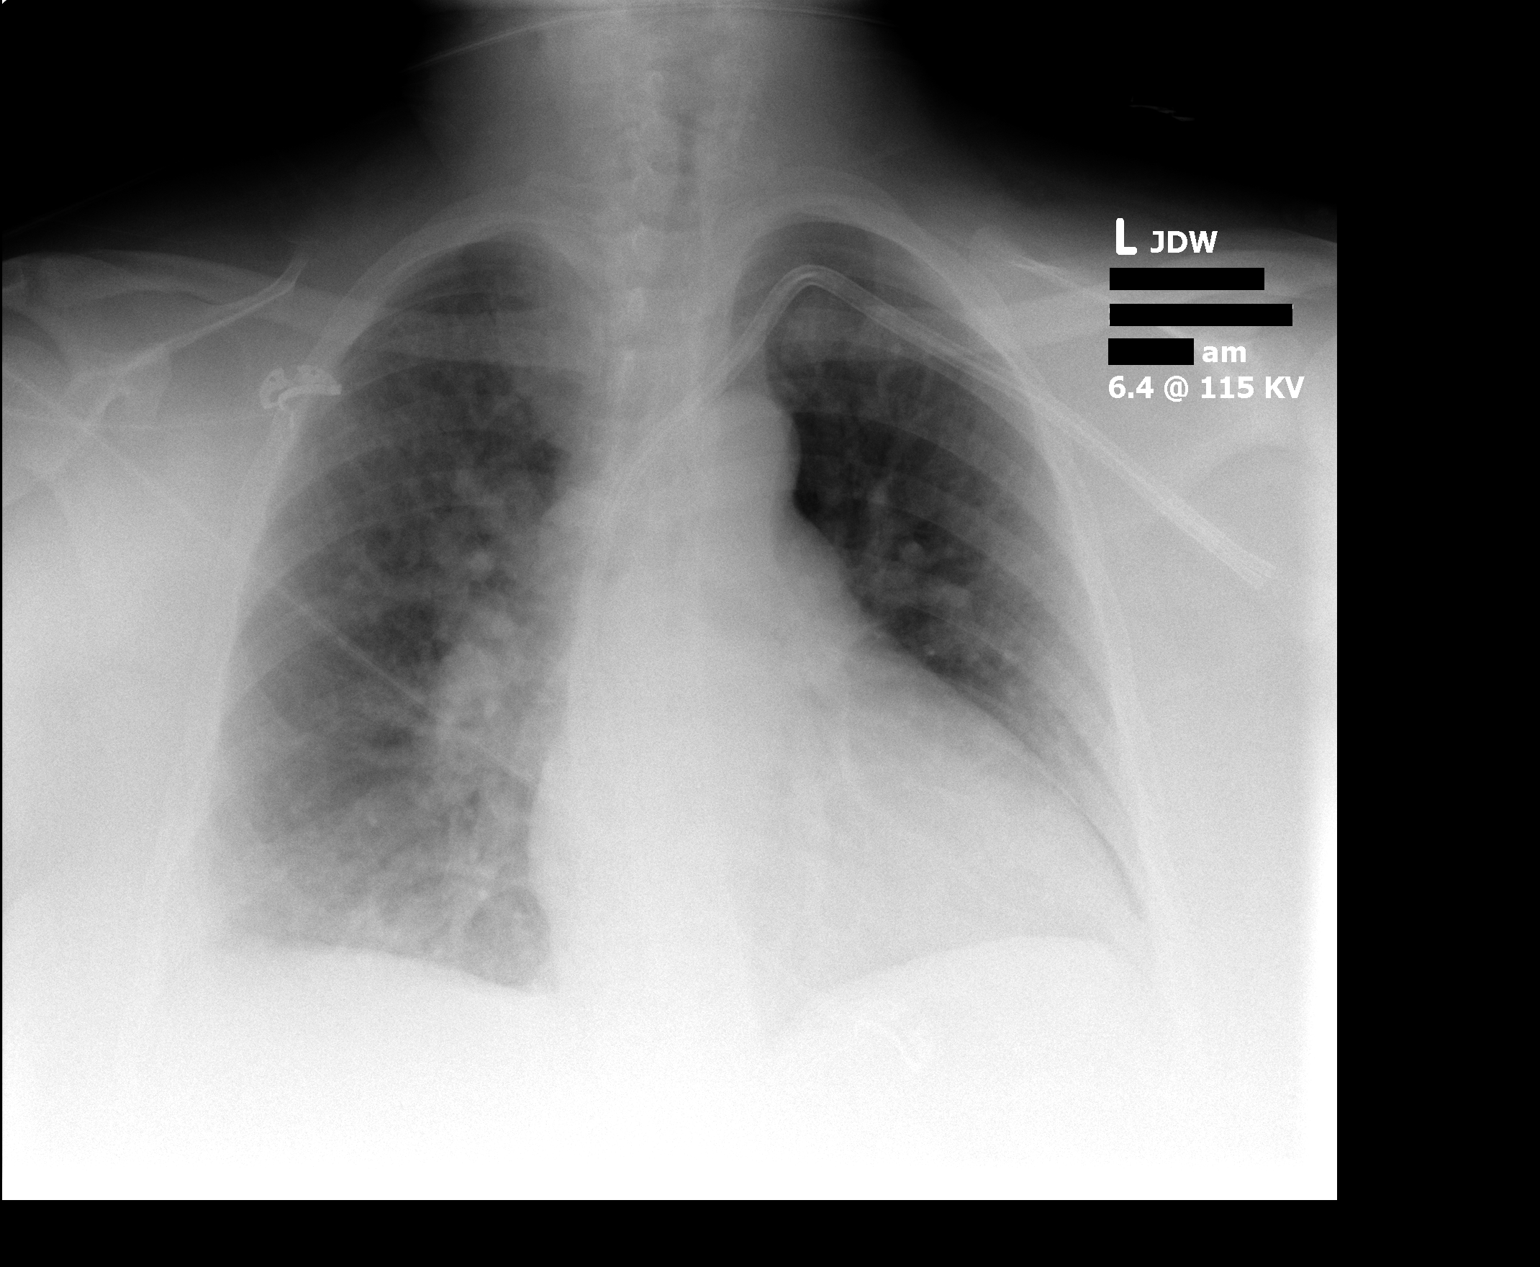

[1 of 1 positions shown; findings below may reference images not displayed]

FINDINGS: Left-sided diatek catheter is in place, tip to the level
of the right atrium.  Heart is enlarged.  There is mild pulmonary
vascular congestion.  No focal consolidations.  Possible small
right pleural effusion versus atelectasis.  No evidence for
pneumothorax.
IMPRESSION: 1.  Cardiomegaly.
2.  Pulmonary vascular congestion.
3.  Right costophrenic angle blunting consistent with atelectasis
or small effusion.

## 2013-10-20 MED ORDER — DEXTROSE 5 % IV SOLN
1.5000 g | INTRAVENOUS | Status: AC
  Administered 2013-11-11: 1.5 g via INTRAVENOUS

## 2013-10-21 ENCOUNTER — Other Ambulatory Visit: Payer: Self-pay

## 2013-10-21 ENCOUNTER — Ambulatory Visit (HOSPITAL_COMMUNITY): Admission: RE | Admit: 2013-10-21 | Payer: Medicare Other | Source: Ambulatory Visit | Admitting: Surgery

## 2013-10-21 SURGERY — INSERTION OF ARTERIOVENOUS (AV) GORE-TEX GRAFT ARM
Anesthesia: Monitor Anesthesia Care | Site: Arm Upper | Laterality: Right

## 2013-10-27 DIAGNOSIS — I2699 Other pulmonary embolism without acute cor pulmonale: Secondary | ICD-10-CM | POA: Diagnosis not present

## 2013-11-02 ENCOUNTER — Encounter (HOSPITAL_COMMUNITY): Payer: Self-pay | Admitting: Pharmacy Technician

## 2013-11-02 DIAGNOSIS — N186 End stage renal disease: Secondary | ICD-10-CM | POA: Diagnosis not present

## 2013-11-03 DIAGNOSIS — I2699 Other pulmonary embolism without acute cor pulmonale: Secondary | ICD-10-CM | POA: Diagnosis not present

## 2013-11-03 DIAGNOSIS — R7309 Other abnormal glucose: Secondary | ICD-10-CM | POA: Diagnosis not present

## 2013-11-03 DIAGNOSIS — N186 End stage renal disease: Secondary | ICD-10-CM | POA: Diagnosis not present

## 2013-11-03 DIAGNOSIS — D631 Anemia in chronic kidney disease: Secondary | ICD-10-CM | POA: Diagnosis not present

## 2013-11-03 DIAGNOSIS — D509 Iron deficiency anemia, unspecified: Secondary | ICD-10-CM | POA: Diagnosis not present

## 2013-11-03 DIAGNOSIS — N2581 Secondary hyperparathyroidism of renal origin: Secondary | ICD-10-CM | POA: Diagnosis not present

## 2013-11-03 DIAGNOSIS — K7689 Other specified diseases of liver: Secondary | ICD-10-CM | POA: Diagnosis not present

## 2013-11-10 ENCOUNTER — Encounter (HOSPITAL_COMMUNITY): Payer: Self-pay | Admitting: *Deleted

## 2013-11-10 DIAGNOSIS — I2699 Other pulmonary embolism without acute cor pulmonale: Secondary | ICD-10-CM | POA: Diagnosis not present

## 2013-11-10 MED ORDER — SODIUM CHLORIDE 0.9 % IV SOLN
INTRAVENOUS | Status: DC
  Administered 2013-11-11: 07:00:00 via INTRAVENOUS

## 2013-11-11 ENCOUNTER — Encounter (HOSPITAL_COMMUNITY)
Admission: RE | Disposition: A | Discharge status: Home / Self Care | Payer: Self-pay | Source: Ambulatory Visit | Attending: Surgery

## 2013-11-11 ENCOUNTER — Ambulatory Visit (HOSPITAL_COMMUNITY): Payer: Medicare Other | Admitting: Certified Registered"

## 2013-11-11 ENCOUNTER — Encounter (HOSPITAL_COMMUNITY): Payer: Self-pay | Admitting: Certified Registered"

## 2013-11-11 ENCOUNTER — Ambulatory Visit (HOSPITAL_COMMUNITY)
Admission: RE | Admit: 2013-11-11 | Discharge: 2013-11-11 | Disposition: A | Discharge status: Home / Self Care | Payer: Medicare Other | Source: Ambulatory Visit | Attending: Surgery | Admitting: Surgery

## 2013-11-11 ENCOUNTER — Encounter (HOSPITAL_COMMUNITY): Payer: Medicare Other | Admitting: Certified Registered"

## 2013-11-11 DIAGNOSIS — Z7901 Long term (current) use of anticoagulants: Secondary | ICD-10-CM | POA: Insufficient documentation

## 2013-11-11 DIAGNOSIS — N186 End stage renal disease: Secondary | ICD-10-CM | POA: Diagnosis not present

## 2013-11-11 DIAGNOSIS — I120 Hypertensive chronic kidney disease with stage 5 chronic kidney disease or end stage renal disease: Secondary | ICD-10-CM | POA: Insufficient documentation

## 2013-11-11 DIAGNOSIS — T82898A Other specified complication of vascular prosthetic devices, implants and grafts, initial encounter: Secondary | ICD-10-CM | POA: Diagnosis not present

## 2013-11-11 DIAGNOSIS — Z992 Dependence on renal dialysis: Secondary | ICD-10-CM | POA: Insufficient documentation

## 2013-11-11 DIAGNOSIS — R0602 Shortness of breath: Secondary | ICD-10-CM | POA: Diagnosis not present

## 2013-11-11 DIAGNOSIS — D573 Sickle-cell trait: Secondary | ICD-10-CM | POA: Insufficient documentation

## 2013-11-11 DIAGNOSIS — N185 Chronic kidney disease, stage 5: Secondary | ICD-10-CM | POA: Diagnosis not present

## 2013-11-11 DIAGNOSIS — Z6841 Body Mass Index (BMI) 40.0 and over, adult: Secondary | ICD-10-CM | POA: Insufficient documentation

## 2013-11-11 DIAGNOSIS — Z86711 Personal history of pulmonary embolism: Secondary | ICD-10-CM | POA: Insufficient documentation

## 2013-11-11 HISTORY — DX: Anemia, unspecified: D64.9

## 2013-11-11 HISTORY — PX: AV FISTULA PLACEMENT: SHX1204

## 2013-11-11 LAB — POCT I-STAT 4, (NA,K, GLUC, HGB,HCT)
Glucose, Bld: 91 mg/dL (ref 70–99)
HCT: 32 % — ABNORMAL LOW (ref 36.0–46.0)
Hemoglobin: 10.9 g/dL — ABNORMAL LOW (ref 12.0–15.0)
Potassium: 4.4 meq/L (ref 3.7–5.3)
Sodium: 138 meq/L (ref 137–147)

## 2013-11-11 LAB — HCG, SERUM, QUALITATIVE: Preg, Serum: NEGATIVE

## 2013-11-11 LAB — PROTIME-INR
INR: 1.23 (ref 0.00–1.49)
Prothrombin Time: 15.5 seconds — ABNORMAL HIGH (ref 11.6–15.2)

## 2013-11-11 LAB — APTT: aPTT: 33 seconds (ref 24–37)

## 2013-11-11 SURGERY — INSERTION OF ARTERIOVENOUS (AV) GORE-TEX GRAFT ARM
Anesthesia: General | Site: Arm Upper | Laterality: Right

## 2013-11-11 MED ORDER — OXYCODONE HCL 5 MG PO TABS
ORAL_TABLET | ORAL | Status: AC
  Filled 2013-11-11: qty 1

## 2013-11-11 MED ORDER — PROTAMINE SULFATE 10 MG/ML IV SOLN
INTRAVENOUS | Status: DC | PRN
  Administered 2013-11-11: 25 mg via INTRAVENOUS

## 2013-11-11 MED ORDER — SUCCINYLCHOLINE CHLORIDE 20 MG/ML IJ SOLN
INTRAMUSCULAR | Status: AC
  Filled 2013-11-11: qty 1

## 2013-11-11 MED ORDER — MIDAZOLAM HCL 2 MG/2ML IJ SOLN
INTRAMUSCULAR | Status: AC
  Filled 2013-11-11: qty 2

## 2013-11-11 MED ORDER — PROPOFOL 10 MG/ML IV BOLUS
INTRAVENOUS | Status: DC | PRN
  Administered 2013-11-11: 50 mg via INTRAVENOUS
  Administered 2013-11-11: 200 mg via INTRAVENOUS

## 2013-11-11 MED ORDER — LIDOCAINE HCL (CARDIAC) 20 MG/ML IV SOLN
INTRAVENOUS | Status: DC | PRN
  Administered 2013-11-11: 100 mg via INTRAVENOUS

## 2013-11-11 MED ORDER — EPHEDRINE SULFATE 50 MG/ML IJ SOLN
INTRAMUSCULAR | Status: AC
  Filled 2013-11-11: qty 1

## 2013-11-11 MED ORDER — PROPOFOL 10 MG/ML IV BOLUS
INTRAVENOUS | Status: AC
  Filled 2013-11-11: qty 20

## 2013-11-11 MED ORDER — CHLORHEXIDINE GLUCONATE CLOTH 2 % EX PADS: 6.0000 | MEDICATED_PAD | Freq: Once | CUTANEOUS | Status: DC

## 2013-11-11 MED ORDER — HEPARIN SODIUM (PORCINE) 1000 UNIT/ML IJ SOLN
INTRAMUSCULAR | Status: DC | PRN
  Administered 2013-11-11: 5000 [IU] via INTRAVENOUS

## 2013-11-11 MED ORDER — ONDANSETRON HCL 4 MG/2ML IJ SOLN
INTRAMUSCULAR | Status: AC
  Filled 2013-11-11: qty 2

## 2013-11-11 MED ORDER — HEPARIN SODIUM (PORCINE) 1000 UNIT/ML IJ SOLN
INTRAMUSCULAR | Status: AC
  Filled 2013-11-11: qty 1

## 2013-11-11 MED ORDER — METOCLOPRAMIDE HCL 5 MG/ML IJ SOLN: 10.0000 mg | Freq: Once | INTRAMUSCULAR | Status: DC | PRN

## 2013-11-11 MED ORDER — HEMOSTATIC AGENTS (NO CHARGE) OPTIME
TOPICAL | Status: DC | PRN
  Administered 2013-11-11: 1 via TOPICAL

## 2013-11-11 MED ORDER — MIDAZOLAM HCL 5 MG/5ML IJ SOLN
INTRAMUSCULAR | Status: DC | PRN
  Administered 2013-11-11: 2 mg via INTRAVENOUS

## 2013-11-11 MED ORDER — 0.9 % SODIUM CHLORIDE (POUR BTL) OPTIME
TOPICAL | Status: DC | PRN
  Administered 2013-11-11 (×2): 1000 mL

## 2013-11-11 MED ORDER — FENTANYL CITRATE 0.05 MG/ML IJ SOLN
INTRAMUSCULAR | Status: DC | PRN
  Administered 2013-11-11: 100 ug via INTRAVENOUS
  Administered 2013-11-11 (×4): 50 ug via INTRAVENOUS
  Administered 2013-11-11: 100 ug via INTRAVENOUS
  Administered 2013-11-11: 50 ug via INTRAVENOUS

## 2013-11-11 MED ORDER — OXYCODONE HCL 5 MG/5ML PO SOLN: 5.0000 mg | Freq: Once | ORAL | Status: AC | PRN

## 2013-11-11 MED ORDER — EPHEDRINE SULFATE 50 MG/ML IJ SOLN
INTRAMUSCULAR | Status: DC | PRN
  Administered 2013-11-11 (×2): 10 mg via INTRAVENOUS

## 2013-11-11 MED ORDER — CHLORHEXIDINE GLUCONATE CLOTH 2 % EX PADS
6.0000 | MEDICATED_PAD | Freq: Once | CUTANEOUS | Status: DC
Start: 2013-11-11 — End: 2013-11-11

## 2013-11-11 MED ORDER — ONDANSETRON HCL 4 MG/2ML IJ SOLN
INTRAMUSCULAR | Status: DC | PRN
  Administered 2013-11-11: 4 mg via INTRAVENOUS

## 2013-11-11 MED ORDER — OXYCODONE-ACETAMINOPHEN 5-325 MG PO TABS: 1.0000 | ORAL_TABLET | Freq: Four times a day (QID) | ORAL | Status: DC | PRN

## 2013-11-11 MED ORDER — LIDOCAINE-EPINEPHRINE (PF) 1 %-1:200000 IJ SOLN
INTRAMUSCULAR | Status: DC | PRN
  Administered 2013-11-11: 6 mL

## 2013-11-11 MED ORDER — SODIUM CHLORIDE 0.9 % IR SOLN
Status: DC | PRN
  Administered 2013-11-11: 10:00:00

## 2013-11-11 MED ORDER — FENTANYL CITRATE 0.05 MG/ML IJ SOLN
25.0000 ug | INTRAMUSCULAR | Status: DC | PRN
  Administered 2013-11-11: 50 ug via INTRAVENOUS
  Administered 2013-11-11 (×2): 25 ug via INTRAVENOUS

## 2013-11-11 MED ORDER — HEPARIN SODIUM (PORCINE) 1000 UNIT/ML IJ SOLN
INTRAMUSCULAR | Status: DC | PRN
  Administered 2013-11-11: 2500 [IU]

## 2013-11-11 MED ORDER — FENTANYL CITRATE 0.05 MG/ML IJ SOLN
INTRAMUSCULAR | Status: AC
  Filled 2013-11-11: qty 5

## 2013-11-11 MED ORDER — OXYCODONE HCL 5 MG PO TABS
5.0000 mg | ORAL_TABLET | Freq: Once | ORAL | Status: AC | PRN
  Administered 2013-11-11: 5 mg via ORAL

## 2013-11-11 MED ORDER — DEXTROSE 5 % IV SOLN
INTRAVENOUS | Status: AC
  Filled 2013-11-11: qty 1.5

## 2013-11-11 MED ORDER — FENTANYL CITRATE 0.05 MG/ML IJ SOLN
INTRAMUSCULAR | Status: AC
  Administered 2013-11-11: 25 ug via INTRAVENOUS
  Filled 2013-11-11: qty 2

## 2013-11-11 SURGICAL SUPPLY — 44 items
ADH SKN CLS APL DERMABOND .7 (GAUZE/BANDAGES/DRESSINGS) ×1
ADH SKN CLS LQ APL DERMABOND (GAUZE/BANDAGES/DRESSINGS) ×2
ARMBAND PINK RESTRICT EXTREMIT (MISCELLANEOUS) ×3 IMPLANT
BLADE 10 SAFETY STRL DISP (BLADE) ×3 IMPLANT
CANISTER SUCTION 2500CC (MISCELLANEOUS) ×3 IMPLANT
CLIP TI MEDIUM 6 (CLIP) ×3 IMPLANT
CLIP TI WIDE RED SMALL 6 (CLIP) ×5 IMPLANT
COVER SURGICAL LIGHT HANDLE (MISCELLANEOUS) ×3 IMPLANT
DERMABOND ADHESIVE PROPEN (GAUZE/BANDAGES/DRESSINGS) ×4
DERMABOND ADVANCED (GAUZE/BANDAGES/DRESSINGS) ×2
DERMABOND ADVANCED .7 DNX12 (GAUZE/BANDAGES/DRESSINGS) ×1 IMPLANT
DERMABOND ADVANCED .7 DNX6 (GAUZE/BANDAGES/DRESSINGS) IMPLANT
ELECT REM PT RETURN 9FT ADLT (ELECTROSURGICAL) ×3
ELECTRODE REM PT RTRN 9FT ADLT (ELECTROSURGICAL) ×1 IMPLANT
GEL ULTRASOUND 20GR AQUASONIC (MISCELLANEOUS) IMPLANT
GLOVE BIOGEL M 6.5 STRL (GLOVE) ×2 IMPLANT
GLOVE BIOGEL PI IND STRL 6.5 (GLOVE) IMPLANT
GLOVE BIOGEL PI IND STRL 7.5 (GLOVE) ×1 IMPLANT
GLOVE BIOGEL PI INDICATOR 6.5 (GLOVE) ×2
GLOVE BIOGEL PI INDICATOR 7.5 (GLOVE) ×2
GLOVE SURG SS PI 7.5 STRL IVOR (GLOVE) ×3 IMPLANT
GOWN STRL REUS W/ TWL LRG LVL3 (GOWN DISPOSABLE) ×2 IMPLANT
GOWN STRL REUS W/ TWL XL LVL3 (GOWN DISPOSABLE) ×1 IMPLANT
GOWN STRL REUS W/TWL LRG LVL3 (GOWN DISPOSABLE) ×9
GOWN STRL REUS W/TWL XL LVL3 (GOWN DISPOSABLE) ×3
GRAFT COLLAGEN VASCULAR 7X40 (Vascular Products) ×2 IMPLANT
HEMOSTAT SNOW SURGICEL 2X4 (HEMOSTASIS) IMPLANT
KIT BASIN OR (CUSTOM PROCEDURE TRAY) ×3 IMPLANT
KIT ROOM TURNOVER OR (KITS) ×3 IMPLANT
NDL 18GX1X1/2 (RX/OR ONLY) (NEEDLE) IMPLANT
NEEDLE 18GX1X1/2 (RX/OR ONLY) (NEEDLE) ×3 IMPLANT
NS IRRIG 1000ML POUR BTL (IV SOLUTION) ×3 IMPLANT
PACK CV ACCESS (CUSTOM PROCEDURE TRAY) ×3 IMPLANT
PAD ARMBOARD 7.5X6 YLW CONV (MISCELLANEOUS) ×6 IMPLANT
SUT PROLENE 6 0 BV (SUTURE) ×10 IMPLANT
SUT SILK 2 0 SH (SUTURE) ×2 IMPLANT
SUT VIC AB 3-0 SH 27 (SUTURE) ×9
SUT VIC AB 3-0 SH 27X BRD (SUTURE) ×2 IMPLANT
SUT VICRYL 4-0 PS2 18IN ABS (SUTURE) ×4 IMPLANT
SYRINGE 35CC LL (MISCELLANEOUS) ×2 IMPLANT
TOWEL OR 17X24 6PK STRL BLUE (TOWEL DISPOSABLE) ×3 IMPLANT
TOWEL OR 17X26 10 PK STRL BLUE (TOWEL DISPOSABLE) ×3 IMPLANT
UNDERPAD 30X30 INCONTINENT (UNDERPADS AND DIAPERS) ×3 IMPLANT
WATER STERILE IRR 1000ML POUR (IV SOLUTION) ×3 IMPLANT

## 2013-11-11 NOTE — Discharge Instructions (Signed)

## 2013-11-11 NOTE — Anesthesia Procedure Notes (Signed)
Procedure Name: LMA Insertion Date/Time: 11/11/2013 9:37 AM Performed by: Charm BargesBUTLER, Barabara Motz R Pre-anesthesia Checklist: Patient identified, Emergency Drugs available, Suction available, Patient being monitored and Timeout performed Patient Re-evaluated:Patient Re-evaluated prior to inductionOxygen Delivery Method: Circle system utilized Preoxygenation: Pre-oxygenation with 100% oxygen Intubation Type: IV induction Ventilation: Mask ventilation without difficulty LMA: LMA with gastric port inserted LMA Size: 4.0 Number of attempts: 1 Tube secured with: Tape Dental Injury: Teeth and Oropharynx as per pre-operative assessment

## 2013-11-11 NOTE — Op Note (Signed)
    Patient name: Kristine Rios MRN: [id number] DOB: 07-03-1975 Sex: female  11/11/2013 Pre-operative Diagnosis: End stage renal disease Post-operative diagnosis:  Same Surgeon:  Jorge NyBRABHAM IV, V. WELLS Assistants:  Lianne CureMaureen Collins Procedure:   Right upper arm AV bovine graft dialysis graft Anesthesia:  Gen. Blood Loss:  See anesthesia record Specimens:  None  Findings:  End-to-side venous anastomosis  Indications:  The patient comes in for dialysis access.  She has a Gore-Tex allergies.  Procedure:  The patient was identified in the holding area and [facility]  The patient was then placed supine on the table. general anesthesia was administered.  The patient was prepped and draped in the usual sterile fashion.  A time out was called and antibiotics were administered.  A longitudinal incision was made just proximal to the antecubital crease.  Through this incision the brachial artery was dissected free.  This was about a 3 mm, undiseased artery.  I then made a longitudinal incision up near the axilla.  The axillary vein was exposed through this incision.  This was a very large axillary vein.  I then used a curved tunneler to create a subcutaneous tunnel.  The patient was fully heparinized.  A 6 mm bovine graft was selected, as the patient has a Gore-Tex allergy.  This was properly prepared on the back table.  It was distended with heparin saline to make sure there were no leaks.  It was marked for orientation. 5000 units of heparin was administered.  After the heparin circulated, the brachial artery was occluded with vascular clamps.  A #11 blade was used to make an arteriotomy which was extended longitudinally with Potts scissors.  I beveled the end of the graft and sewed the anastomosis in a end to side fashion with 6-0 Prolene.  There was redundancy on the graft given the size mismatch.  The graft was then oriented and brought through the tunnel.  The axillary vein was then occluded.  An 11  blade was used to make a venotomy which was extended longitudinally with Potts scissors.  The graft was pulled to the appropriate length and bevel.  A running end to side anastomosis was then created with 6-0 Prolene.  Prior to completion, the appropriate flushing maneuvers were performed.  The anastomosis was completed.  There was a palpable thrill within the graft.  The patient had a excellent Doppler signal in the radial artery.  25 mg of protamine was administered.  The incisions were closed with 30 and 4-0 Vicryl, followed by Dermabond.  There were no immediate complications.   Disposition:  To PACU in stable condition.   Juleen ChinaV. Wells Brabham, M.D. [facility] [place] Office: [phone] Pager:  [phone]
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
## 2013-11-11 NOTE — Anesthesia Postprocedure Evaluation (Signed)
Anesthesia Post Note  Patient: Kristine Rios  Procedure(s) Performed: Procedure(s) (LRB): INSERTION OF RIGHT UPPER ARM ARTERIOVENOUS GRAFT WITH BOVINE CAROTID GRAFT (Right)  Anesthesia type: General  Patient location: PACU  Post pain: Pain level controlled  Post assessment: Patient's Cardiovascular Status Stable  Last Vitals:  Filed Vitals:   11/11/13 1232  BP: 133/81  Pulse: 108  Temp:   Resp: 15    Post vital signs: Reviewed and stable  Level of consciousness: alert  Complications: No apparent anesthesia complications

## 2013-11-11 NOTE — Transfer of Care (Signed)
Immediate Anesthesia Transfer of Care Note  Patient: Kristine Rios  Procedure(s) Performed: Procedure(s): INSERTION OF RIGHT UPPER ARM ARTERIOVENOUS GRAFT WITH BOVINE CAROTID GRAFT (Right)  Patient Location: PACU  Anesthesia Type:General  Level of Consciousness: awake, alert  and oriented  Airway & Oxygen Therapy: Patient Spontanous Breathing and Patient connected to nasal cannula oxygen  Post-op Assessment: Report given to PACU RN, Post -op Vital signs reviewed and stable and Patient moving all extremities  Post vital signs: Reviewed and stable  Complications: No apparent anesthesia complications

## 2013-11-11 NOTE — Anesthesia Preprocedure Evaluation (Addendum)
Anesthesia Evaluation  Patient identified by MRN, date of birth, ID band Patient awake    Reviewed: Allergy & Precautions, H&P , NPO status , Patient's Chart, lab work & pertinent test results, reviewed documented beta blocker date and time   Airway Mallampati: II TM Distance: >3 FB Neck ROM: full    Dental  (+) Teeth Intact, Dental Advisory Given   Pulmonary shortness of breath,  breath sounds clear to auscultation        Cardiovascular hypertension, On Medications + Valvular Problems/Murmurs Rhythm:regular     Neuro/Psych negative neurological ROS  negative psych ROS   GI/Hepatic negative GI ROS, Neg liver ROS,   Endo/Other  Morbid obesity  Renal/GU Dialysis and ESRFRenal disease  negative genitourinary   Musculoskeletal   Abdominal   Peds  Hematology  (+) anemia ,   Anesthesia Other Findings See surgeon's H&P   Reproductive/Obstetrics negative OB ROS                          Anesthesia Physical Anesthesia Plan  ASA: III  Anesthesia Plan: General   Post-op Pain Management:    Induction: Intravenous  Airway Management Planned: LMA  Additional Equipment: None  Intra-op Plan:   Post-operative Plan: Extubation in OR  Informed Consent: I have reviewed the patients History and Physical, chart, labs and discussed the procedure including the risks, benefits and alternatives for the proposed anesthesia with the patient or authorized representative who has indicated his/her understanding and acceptance.   Dental Advisory Given  Plan Discussed with: CRNA, Surgeon and Anesthesiologist  Anesthesia Plan Comments:        Anesthesia Quick Evaluation

## 2013-11-12 ENCOUNTER — Encounter (HOSPITAL_COMMUNITY): Payer: Self-pay | Admitting: Surgery

## 2013-11-15 ENCOUNTER — Telehealth: Payer: Self-pay

## 2013-11-15 DIAGNOSIS — N186 End stage renal disease: Secondary | ICD-10-CM

## 2013-11-15 DIAGNOSIS — R29898 Other symptoms and signs involving the musculoskeletal system: Secondary | ICD-10-CM

## 2013-11-15 DIAGNOSIS — R202 Paresthesia of skin: Principal | ICD-10-CM

## 2013-11-15 DIAGNOSIS — Z48812 Encounter for surgical aftercare following surgery on the circulatory system: Secondary | ICD-10-CM

## 2013-11-15 DIAGNOSIS — R200 Anesthesia of skin: Secondary | ICD-10-CM

## 2013-11-15 NOTE — Telephone Encounter (Signed)
Pt. called and reported the numbness and tingling of the right thumb, middle, and ring finger has continued, and has had no improvement in symptoms.  Reported that if she holds an object in right hand, the hand becomes weak, and her fingers will give out.  Discussed w/ Dr. Hart RochesterLawson.  Advised to obtain an upper extremity arterial doppler to r/o Steal syndrome, and to see the Nurse Practitioner tomorrow.  Notified pt. of appt. At 2:30 PM 11/16/13.  Agrees w/ plan.

## 2013-11-16 ENCOUNTER — Ambulatory Visit (INDEPENDENT_AMBULATORY_CARE_PROVIDER_SITE_OTHER): Payer: Self-pay | Admitting: Family

## 2013-11-16 ENCOUNTER — Ambulatory Visit (HOSPITAL_COMMUNITY)
Admission: RE | Admit: 2013-11-16 | Discharge: 2013-11-16 | Disposition: A | Discharge status: Home / Self Care | Payer: Medicare Other | Source: Ambulatory Visit | Attending: Surgery | Admitting: Surgery

## 2013-11-16 ENCOUNTER — Encounter: Payer: Self-pay | Admitting: Family

## 2013-11-16 ENCOUNTER — Other Ambulatory Visit: Payer: Self-pay | Admitting: Vascular Surgery

## 2013-11-16 VITALS — BP 136/91 | HR 106 | Resp 16 | Ht 63.0 in | Wt 265.0 lb

## 2013-11-16 DIAGNOSIS — Z48812 Encounter for surgical aftercare following surgery on the circulatory system: Secondary | ICD-10-CM | POA: Diagnosis not present

## 2013-11-16 DIAGNOSIS — R200 Anesthesia of skin: Secondary | ICD-10-CM | POA: Insufficient documentation

## 2013-11-16 DIAGNOSIS — R202 Paresthesia of skin: Principal | ICD-10-CM

## 2013-11-16 DIAGNOSIS — M6281 Muscle weakness (generalized): Secondary | ICD-10-CM | POA: Insufficient documentation

## 2013-11-16 DIAGNOSIS — R209 Unspecified disturbances of skin sensation: Secondary | ICD-10-CM | POA: Diagnosis not present

## 2013-11-16 DIAGNOSIS — R29898 Other symptoms and signs involving the musculoskeletal system: Secondary | ICD-10-CM

## 2013-11-16 DIAGNOSIS — N186 End stage renal disease: Secondary | ICD-10-CM
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
## 2013-11-17 DIAGNOSIS — I2699 Other pulmonary embolism without acute cor pulmonale: Secondary | ICD-10-CM | POA: Diagnosis not present

## 2013-11-24 DIAGNOSIS — I2699 Other pulmonary embolism without acute cor pulmonale: Secondary | ICD-10-CM | POA: Diagnosis not present

## 2013-12-01 DIAGNOSIS — D6859 Other primary thrombophilia: Secondary | ICD-10-CM | POA: Diagnosis not present

## 2013-12-03 DIAGNOSIS — N186 End stage renal disease: Secondary | ICD-10-CM | POA: Diagnosis not present

## 2013-12-06 DIAGNOSIS — D509 Iron deficiency anemia, unspecified: Secondary | ICD-10-CM | POA: Diagnosis not present

## 2013-12-06 DIAGNOSIS — D631 Anemia in chronic kidney disease: Secondary | ICD-10-CM | POA: Diagnosis not present

## 2013-12-06 DIAGNOSIS — N2581 Secondary hyperparathyroidism of renal origin: Secondary | ICD-10-CM | POA: Diagnosis not present

## 2013-12-06 DIAGNOSIS — N186 End stage renal disease: Secondary | ICD-10-CM | POA: Diagnosis not present

## 2013-12-06 DIAGNOSIS — K7689 Other specified diseases of liver: Secondary | ICD-10-CM | POA: Diagnosis not present

## 2013-12-13 ENCOUNTER — Ambulatory Visit: Payer: Medicare Other | Admitting: Surgery

## 2013-12-14 ENCOUNTER — Encounter: Payer: Self-pay | Admitting: Surgery

## 2013-12-15 ENCOUNTER — Encounter: Payer: Self-pay | Admitting: Surgery

## 2013-12-15 ENCOUNTER — Ambulatory Visit (INDEPENDENT_AMBULATORY_CARE_PROVIDER_SITE_OTHER): Payer: Medicare Other | Admitting: Surgery

## 2013-12-15 VITALS — BP 119/74 | HR 117 | Resp 16 | Ht 63.0 in | Wt 266.0 lb

## 2013-12-15 DIAGNOSIS — N186 End stage renal disease: Secondary | ICD-10-CM

## 2013-12-15 NOTE — Progress Notes (Signed)
The patient is back today for followup.  She is status post creation of a right upper arm AV bovine dialysis graft.  She has a Gore-Tex allergy.  Her procedure was performed on 11/11/2013.  She has no complaints of steal syndrome.  She describes no swelling in her arm.  On examination her incisions are healed nicely.  There is an excellent thrill within her graft.  There is no edema in her arm.  Her graft is ready to use.  I spent a significant amount of time discussing her need to be evaluated for transplant

## 2013-12-17 DIAGNOSIS — I2699 Other pulmonary embolism without acute cor pulmonale: Secondary | ICD-10-CM | POA: Diagnosis not present

## 2013-12-30 DIAGNOSIS — I2699 Other pulmonary embolism without acute cor pulmonale: Secondary | ICD-10-CM | POA: Diagnosis not present

## 2014-01-03 DIAGNOSIS — N186 End stage renal disease: Secondary | ICD-10-CM | POA: Diagnosis not present

## 2014-01-06 DIAGNOSIS — N186 End stage renal disease: Secondary | ICD-10-CM | POA: Diagnosis not present

## 2014-01-06 DIAGNOSIS — D631 Anemia in chronic kidney disease: Secondary | ICD-10-CM | POA: Diagnosis not present

## 2014-01-06 DIAGNOSIS — K7689 Other specified diseases of liver: Secondary | ICD-10-CM | POA: Diagnosis not present

## 2014-01-06 DIAGNOSIS — N2581 Secondary hyperparathyroidism of renal origin: Secondary | ICD-10-CM | POA: Diagnosis not present

## 2014-01-06 DIAGNOSIS — D509 Iron deficiency anemia, unspecified: Secondary | ICD-10-CM | POA: Diagnosis not present

## 2014-01-07 ENCOUNTER — Encounter: Payer: Self-pay | Admitting: Vascular Surgery

## 2014-01-07 DIAGNOSIS — D631 Anemia in chronic kidney disease: Secondary | ICD-10-CM | POA: Diagnosis not present

## 2014-01-07 DIAGNOSIS — D509 Iron deficiency anemia, unspecified: Secondary | ICD-10-CM | POA: Diagnosis not present

## 2014-01-07 DIAGNOSIS — N2581 Secondary hyperparathyroidism of renal origin: Secondary | ICD-10-CM | POA: Diagnosis not present

## 2014-01-07 DIAGNOSIS — N186 End stage renal disease: Secondary | ICD-10-CM | POA: Diagnosis not present

## 2014-01-07 DIAGNOSIS — K7689 Other specified diseases of liver: Secondary | ICD-10-CM | POA: Diagnosis not present

## 2014-01-10 DIAGNOSIS — K7689 Other specified diseases of liver: Secondary | ICD-10-CM | POA: Diagnosis not present

## 2014-01-10 DIAGNOSIS — D631 Anemia in chronic kidney disease: Secondary | ICD-10-CM | POA: Diagnosis not present

## 2014-01-10 DIAGNOSIS — N186 End stage renal disease: Secondary | ICD-10-CM | POA: Diagnosis not present

## 2014-01-10 DIAGNOSIS — D509 Iron deficiency anemia, unspecified: Secondary | ICD-10-CM | POA: Diagnosis not present

## 2014-01-10 DIAGNOSIS — N2581 Secondary hyperparathyroidism of renal origin: Secondary | ICD-10-CM | POA: Diagnosis not present

## 2014-01-11 ENCOUNTER — Ambulatory Visit: Payer: Medicare Other | Admitting: Vascular Surgery

## 2014-01-12 DIAGNOSIS — N2581 Secondary hyperparathyroidism of renal origin: Secondary | ICD-10-CM | POA: Diagnosis not present

## 2014-01-12 DIAGNOSIS — D631 Anemia in chronic kidney disease: Secondary | ICD-10-CM | POA: Diagnosis not present

## 2014-01-12 DIAGNOSIS — K7689 Other specified diseases of liver: Secondary | ICD-10-CM | POA: Diagnosis not present

## 2014-01-12 DIAGNOSIS — N186 End stage renal disease: Secondary | ICD-10-CM | POA: Diagnosis not present

## 2014-01-12 DIAGNOSIS — I2699 Other pulmonary embolism without acute cor pulmonale: Secondary | ICD-10-CM | POA: Diagnosis not present

## 2014-01-12 DIAGNOSIS — D509 Iron deficiency anemia, unspecified: Secondary | ICD-10-CM | POA: Diagnosis not present

## 2014-01-14 DIAGNOSIS — N039 Chronic nephritic syndrome with unspecified morphologic changes: Secondary | ICD-10-CM | POA: Diagnosis not present

## 2014-01-14 DIAGNOSIS — D631 Anemia in chronic kidney disease: Secondary | ICD-10-CM | POA: Diagnosis not present

## 2014-01-14 DIAGNOSIS — N2581 Secondary hyperparathyroidism of renal origin: Secondary | ICD-10-CM | POA: Diagnosis not present

## 2014-01-14 DIAGNOSIS — N186 End stage renal disease: Secondary | ICD-10-CM | POA: Diagnosis not present

## 2014-01-14 DIAGNOSIS — K7689 Other specified diseases of liver: Secondary | ICD-10-CM | POA: Diagnosis not present

## 2014-01-14 DIAGNOSIS — D509 Iron deficiency anemia, unspecified: Secondary | ICD-10-CM | POA: Diagnosis not present

## 2014-01-17 ENCOUNTER — Other Ambulatory Visit (HOSPITAL_COMMUNITY): Payer: Self-pay | Admitting: Nephrology

## 2014-01-17 DIAGNOSIS — N2581 Secondary hyperparathyroidism of renal origin: Secondary | ICD-10-CM | POA: Diagnosis not present

## 2014-01-17 DIAGNOSIS — N186 End stage renal disease: Secondary | ICD-10-CM

## 2014-01-17 DIAGNOSIS — D509 Iron deficiency anemia, unspecified: Secondary | ICD-10-CM | POA: Diagnosis not present

## 2014-01-17 DIAGNOSIS — D631 Anemia in chronic kidney disease: Secondary | ICD-10-CM | POA: Diagnosis not present

## 2014-01-17 DIAGNOSIS — K7689 Other specified diseases of liver: Secondary | ICD-10-CM | POA: Diagnosis not present

## 2014-01-19 DIAGNOSIS — N186 End stage renal disease: Secondary | ICD-10-CM | POA: Diagnosis not present

## 2014-01-19 DIAGNOSIS — K7689 Other specified diseases of liver: Secondary | ICD-10-CM | POA: Diagnosis not present

## 2014-01-19 DIAGNOSIS — N2581 Secondary hyperparathyroidism of renal origin: Secondary | ICD-10-CM | POA: Diagnosis not present

## 2014-01-19 DIAGNOSIS — D631 Anemia in chronic kidney disease: Secondary | ICD-10-CM | POA: Diagnosis not present

## 2014-01-19 DIAGNOSIS — D509 Iron deficiency anemia, unspecified: Secondary | ICD-10-CM | POA: Diagnosis not present

## 2014-01-21 ENCOUNTER — Ambulatory Visit (HOSPITAL_COMMUNITY)
Admission: RE | Admit: 2014-01-21 | Discharge: 2014-01-21 | Disposition: A | Discharge status: Home / Self Care | Payer: Medicare Other | Source: Ambulatory Visit | Attending: Nephrology | Admitting: Nephrology

## 2014-01-21 ENCOUNTER — Other Ambulatory Visit (HOSPITAL_COMMUNITY): Payer: Self-pay | Admitting: Nephrology

## 2014-01-21 DIAGNOSIS — N186 End stage renal disease: Secondary | ICD-10-CM | POA: Diagnosis not present

## 2014-01-21 DIAGNOSIS — Z4901 Encounter for fitting and adjustment of extracorporeal dialysis catheter: Secondary | ICD-10-CM | POA: Diagnosis not present

## 2014-01-21 LAB — PROTIME-INR
INR: 1.2 (ref 0.00–1.49)
PROTHROMBIN TIME: 15.2 s (ref 11.6–15.2)

## 2014-01-21 MED ORDER — LIDOCAINE HCL 1 % IJ SOLN
INTRAMUSCULAR | Status: AC
  Filled 2014-01-21: qty 20

## 2014-01-21 MED ORDER — CHLORHEXIDINE GLUCONATE 4 % EX LIQD
CUTANEOUS | Status: AC
  Filled 2014-01-21: qty 15

## 2014-01-21 NOTE — Progress Notes (Signed)
Successful removal of left internal jugular tunneled HD catheter, no longer needed as dialysis graft is working properly. No immediate complications, vaseline dressing placed. Discharge instructions given.  Pattricia Boss PA-C Interventional Radiology  01/21/14  12:21 PM

## 2014-01-22 DIAGNOSIS — K7689 Other specified diseases of liver: Secondary | ICD-10-CM | POA: Diagnosis not present

## 2014-01-22 DIAGNOSIS — N2581 Secondary hyperparathyroidism of renal origin: Secondary | ICD-10-CM | POA: Diagnosis not present

## 2014-01-22 DIAGNOSIS — D631 Anemia in chronic kidney disease: Secondary | ICD-10-CM | POA: Diagnosis not present

## 2014-01-22 DIAGNOSIS — N186 End stage renal disease: Secondary | ICD-10-CM | POA: Diagnosis not present

## 2014-01-22 DIAGNOSIS — N039 Chronic nephritic syndrome with unspecified morphologic changes: Secondary | ICD-10-CM | POA: Diagnosis not present

## 2014-01-22 DIAGNOSIS — D509 Iron deficiency anemia, unspecified: Secondary | ICD-10-CM | POA: Diagnosis not present

## 2014-01-24 DIAGNOSIS — N186 End stage renal disease: Secondary | ICD-10-CM | POA: Diagnosis not present

## 2014-01-24 DIAGNOSIS — D631 Anemia in chronic kidney disease: Secondary | ICD-10-CM | POA: Diagnosis not present

## 2014-01-24 DIAGNOSIS — K7689 Other specified diseases of liver: Secondary | ICD-10-CM | POA: Diagnosis not present

## 2014-01-24 DIAGNOSIS — D509 Iron deficiency anemia, unspecified: Secondary | ICD-10-CM | POA: Diagnosis not present

## 2014-01-24 DIAGNOSIS — N2581 Secondary hyperparathyroidism of renal origin: Secondary | ICD-10-CM | POA: Diagnosis not present

## 2014-01-26 DIAGNOSIS — I2699 Other pulmonary embolism without acute cor pulmonale: Secondary | ICD-10-CM | POA: Diagnosis not present

## 2014-01-26 DIAGNOSIS — D631 Anemia in chronic kidney disease: Secondary | ICD-10-CM | POA: Diagnosis not present

## 2014-01-26 DIAGNOSIS — K7689 Other specified diseases of liver: Secondary | ICD-10-CM | POA: Diagnosis not present

## 2014-01-26 DIAGNOSIS — N2581 Secondary hyperparathyroidism of renal origin: Secondary | ICD-10-CM | POA: Diagnosis not present

## 2014-01-26 DIAGNOSIS — N186 End stage renal disease: Secondary | ICD-10-CM | POA: Diagnosis not present

## 2014-01-26 DIAGNOSIS — D509 Iron deficiency anemia, unspecified: Secondary | ICD-10-CM | POA: Diagnosis not present

## 2014-01-29 DIAGNOSIS — N186 End stage renal disease: Secondary | ICD-10-CM | POA: Diagnosis not present

## 2014-01-29 DIAGNOSIS — D631 Anemia in chronic kidney disease: Secondary | ICD-10-CM | POA: Diagnosis not present

## 2014-01-29 DIAGNOSIS — D509 Iron deficiency anemia, unspecified: Secondary | ICD-10-CM | POA: Diagnosis not present

## 2014-01-29 DIAGNOSIS — K7689 Other specified diseases of liver: Secondary | ICD-10-CM | POA: Diagnosis not present

## 2014-01-29 DIAGNOSIS — N2581 Secondary hyperparathyroidism of renal origin: Secondary | ICD-10-CM | POA: Diagnosis not present

## 2014-01-31 DIAGNOSIS — N2581 Secondary hyperparathyroidism of renal origin: Secondary | ICD-10-CM | POA: Diagnosis not present

## 2014-01-31 DIAGNOSIS — K7689 Other specified diseases of liver: Secondary | ICD-10-CM | POA: Diagnosis not present

## 2014-01-31 DIAGNOSIS — N186 End stage renal disease: Secondary | ICD-10-CM | POA: Diagnosis not present

## 2014-01-31 DIAGNOSIS — D509 Iron deficiency anemia, unspecified: Secondary | ICD-10-CM | POA: Diagnosis not present

## 2014-01-31 DIAGNOSIS — D631 Anemia in chronic kidney disease: Secondary | ICD-10-CM | POA: Diagnosis not present

## 2014-02-02 DIAGNOSIS — K7689 Other specified diseases of liver: Secondary | ICD-10-CM | POA: Diagnosis not present

## 2014-02-02 DIAGNOSIS — N186 End stage renal disease: Secondary | ICD-10-CM | POA: Diagnosis not present

## 2014-02-02 DIAGNOSIS — D509 Iron deficiency anemia, unspecified: Secondary | ICD-10-CM | POA: Diagnosis not present

## 2014-02-02 DIAGNOSIS — D631 Anemia in chronic kidney disease: Secondary | ICD-10-CM | POA: Diagnosis not present

## 2014-02-02 DIAGNOSIS — N039 Chronic nephritic syndrome with unspecified morphologic changes: Secondary | ICD-10-CM | POA: Diagnosis not present

## 2014-02-02 DIAGNOSIS — N2581 Secondary hyperparathyroidism of renal origin: Secondary | ICD-10-CM | POA: Diagnosis not present

## 2014-02-04 DIAGNOSIS — K760 Fatty (change of) liver, not elsewhere classified: Secondary | ICD-10-CM | POA: Diagnosis not present

## 2014-02-04 DIAGNOSIS — D509 Iron deficiency anemia, unspecified: Secondary | ICD-10-CM | POA: Diagnosis not present

## 2014-02-04 DIAGNOSIS — N186 End stage renal disease: Secondary | ICD-10-CM | POA: Diagnosis not present

## 2014-02-04 DIAGNOSIS — Z23 Encounter for immunization: Secondary | ICD-10-CM | POA: Diagnosis not present

## 2014-02-04 DIAGNOSIS — D631 Anemia in chronic kidney disease: Secondary | ICD-10-CM | POA: Diagnosis not present

## 2014-02-04 DIAGNOSIS — N2581 Secondary hyperparathyroidism of renal origin: Secondary | ICD-10-CM | POA: Diagnosis not present

## 2014-02-16 DIAGNOSIS — I2699 Other pulmonary embolism without acute cor pulmonale: Secondary | ICD-10-CM | POA: Diagnosis not present

## 2014-03-05 DIAGNOSIS — N186 End stage renal disease: Secondary | ICD-10-CM | POA: Diagnosis not present

## 2014-03-05 DIAGNOSIS — Z992 Dependence on renal dialysis: Secondary | ICD-10-CM | POA: Diagnosis not present

## 2014-03-07 DIAGNOSIS — N2581 Secondary hyperparathyroidism of renal origin: Secondary | ICD-10-CM | POA: Diagnosis not present

## 2014-03-07 DIAGNOSIS — D509 Iron deficiency anemia, unspecified: Secondary | ICD-10-CM | POA: Diagnosis not present

## 2014-03-07 DIAGNOSIS — K760 Fatty (change of) liver, not elsewhere classified: Secondary | ICD-10-CM | POA: Diagnosis not present

## 2014-03-07 DIAGNOSIS — N186 End stage renal disease: Secondary | ICD-10-CM | POA: Diagnosis not present

## 2014-03-07 DIAGNOSIS — D631 Anemia in chronic kidney disease: Secondary | ICD-10-CM | POA: Diagnosis not present

## 2014-03-16 DIAGNOSIS — I2699 Other pulmonary embolism without acute cor pulmonale: Secondary | ICD-10-CM | POA: Diagnosis not present

## 2014-04-01 DIAGNOSIS — N2581 Secondary hyperparathyroidism of renal origin: Secondary | ICD-10-CM | POA: Diagnosis not present

## 2014-04-01 DIAGNOSIS — N186 End stage renal disease: Secondary | ICD-10-CM | POA: Diagnosis not present

## 2014-04-04 DIAGNOSIS — N186 End stage renal disease: Secondary | ICD-10-CM | POA: Diagnosis not present

## 2014-04-04 DIAGNOSIS — Z992 Dependence on renal dialysis: Secondary | ICD-10-CM | POA: Diagnosis not present

## 2014-04-06 DIAGNOSIS — N186 End stage renal disease: Secondary | ICD-10-CM | POA: Diagnosis not present

## 2014-04-06 DIAGNOSIS — D631 Anemia in chronic kidney disease: Secondary | ICD-10-CM | POA: Diagnosis not present

## 2014-04-06 DIAGNOSIS — K760 Fatty (change of) liver, not elsewhere classified: Secondary | ICD-10-CM | POA: Diagnosis not present

## 2014-04-06 DIAGNOSIS — D509 Iron deficiency anemia, unspecified: Secondary | ICD-10-CM | POA: Diagnosis not present

## 2014-04-06 DIAGNOSIS — N2581 Secondary hyperparathyroidism of renal origin: Secondary | ICD-10-CM | POA: Diagnosis not present

## 2014-04-08 DIAGNOSIS — N186 End stage renal disease: Secondary | ICD-10-CM | POA: Diagnosis not present

## 2014-04-08 DIAGNOSIS — D509 Iron deficiency anemia, unspecified: Secondary | ICD-10-CM | POA: Diagnosis not present

## 2014-04-08 DIAGNOSIS — K760 Fatty (change of) liver, not elsewhere classified: Secondary | ICD-10-CM | POA: Diagnosis not present

## 2014-04-08 DIAGNOSIS — N2581 Secondary hyperparathyroidism of renal origin: Secondary | ICD-10-CM | POA: Diagnosis not present

## 2014-04-08 DIAGNOSIS — D631 Anemia in chronic kidney disease: Secondary | ICD-10-CM | POA: Diagnosis not present

## 2014-04-11 DIAGNOSIS — K760 Fatty (change of) liver, not elsewhere classified: Secondary | ICD-10-CM | POA: Diagnosis not present

## 2014-04-11 DIAGNOSIS — N186 End stage renal disease: Secondary | ICD-10-CM | POA: Diagnosis not present

## 2014-04-11 DIAGNOSIS — D631 Anemia in chronic kidney disease: Secondary | ICD-10-CM | POA: Diagnosis not present

## 2014-04-11 DIAGNOSIS — N2581 Secondary hyperparathyroidism of renal origin: Secondary | ICD-10-CM | POA: Diagnosis not present

## 2014-04-11 DIAGNOSIS — D509 Iron deficiency anemia, unspecified: Secondary | ICD-10-CM | POA: Diagnosis not present

## 2014-04-13 DIAGNOSIS — K760 Fatty (change of) liver, not elsewhere classified: Secondary | ICD-10-CM | POA: Diagnosis not present

## 2014-04-13 DIAGNOSIS — D509 Iron deficiency anemia, unspecified: Secondary | ICD-10-CM | POA: Diagnosis not present

## 2014-04-13 DIAGNOSIS — N186 End stage renal disease: Secondary | ICD-10-CM | POA: Diagnosis not present

## 2014-04-13 DIAGNOSIS — N2581 Secondary hyperparathyroidism of renal origin: Secondary | ICD-10-CM | POA: Diagnosis not present

## 2014-04-13 DIAGNOSIS — I2699 Other pulmonary embolism without acute cor pulmonale: Secondary | ICD-10-CM | POA: Diagnosis not present

## 2014-04-13 DIAGNOSIS — D631 Anemia in chronic kidney disease: Secondary | ICD-10-CM | POA: Diagnosis not present

## 2014-04-14 ENCOUNTER — Encounter (HOSPITAL_COMMUNITY): Payer: Self-pay | Admitting: Surgery

## 2014-04-16 DIAGNOSIS — K760 Fatty (change of) liver, not elsewhere classified: Secondary | ICD-10-CM | POA: Diagnosis not present

## 2014-04-16 DIAGNOSIS — N2581 Secondary hyperparathyroidism of renal origin: Secondary | ICD-10-CM | POA: Diagnosis not present

## 2014-04-16 DIAGNOSIS — D509 Iron deficiency anemia, unspecified: Secondary | ICD-10-CM | POA: Diagnosis not present

## 2014-04-16 DIAGNOSIS — N186 End stage renal disease: Secondary | ICD-10-CM | POA: Diagnosis not present

## 2014-04-16 DIAGNOSIS — D631 Anemia in chronic kidney disease: Secondary | ICD-10-CM | POA: Diagnosis not present

## 2014-04-18 DIAGNOSIS — N186 End stage renal disease: Secondary | ICD-10-CM | POA: Diagnosis not present

## 2014-04-18 DIAGNOSIS — N2581 Secondary hyperparathyroidism of renal origin: Secondary | ICD-10-CM | POA: Diagnosis not present

## 2014-04-18 DIAGNOSIS — K760 Fatty (change of) liver, not elsewhere classified: Secondary | ICD-10-CM | POA: Diagnosis not present

## 2014-04-18 DIAGNOSIS — D631 Anemia in chronic kidney disease: Secondary | ICD-10-CM | POA: Diagnosis not present

## 2014-04-18 DIAGNOSIS — D509 Iron deficiency anemia, unspecified: Secondary | ICD-10-CM | POA: Diagnosis not present

## 2014-04-20 DIAGNOSIS — N186 End stage renal disease: Secondary | ICD-10-CM | POA: Diagnosis not present

## 2014-04-20 DIAGNOSIS — D631 Anemia in chronic kidney disease: Secondary | ICD-10-CM | POA: Diagnosis not present

## 2014-04-20 DIAGNOSIS — N2581 Secondary hyperparathyroidism of renal origin: Secondary | ICD-10-CM | POA: Diagnosis not present

## 2014-04-20 DIAGNOSIS — K760 Fatty (change of) liver, not elsewhere classified: Secondary | ICD-10-CM | POA: Diagnosis not present

## 2014-04-20 DIAGNOSIS — D509 Iron deficiency anemia, unspecified: Secondary | ICD-10-CM | POA: Diagnosis not present

## 2014-04-22 DIAGNOSIS — D631 Anemia in chronic kidney disease: Secondary | ICD-10-CM | POA: Diagnosis not present

## 2014-04-22 DIAGNOSIS — K760 Fatty (change of) liver, not elsewhere classified: Secondary | ICD-10-CM | POA: Diagnosis not present

## 2014-04-22 DIAGNOSIS — N186 End stage renal disease: Secondary | ICD-10-CM | POA: Diagnosis not present

## 2014-04-22 DIAGNOSIS — N2581 Secondary hyperparathyroidism of renal origin: Secondary | ICD-10-CM | POA: Diagnosis not present

## 2014-04-22 DIAGNOSIS — D509 Iron deficiency anemia, unspecified: Secondary | ICD-10-CM | POA: Diagnosis not present

## 2014-04-25 DIAGNOSIS — D631 Anemia in chronic kidney disease: Secondary | ICD-10-CM | POA: Diagnosis not present

## 2014-04-25 DIAGNOSIS — K760 Fatty (change of) liver, not elsewhere classified: Secondary | ICD-10-CM | POA: Diagnosis not present

## 2014-04-25 DIAGNOSIS — N2581 Secondary hyperparathyroidism of renal origin: Secondary | ICD-10-CM | POA: Diagnosis not present

## 2014-04-25 DIAGNOSIS — N186 End stage renal disease: Secondary | ICD-10-CM | POA: Diagnosis not present

## 2014-04-25 DIAGNOSIS — D509 Iron deficiency anemia, unspecified: Secondary | ICD-10-CM | POA: Diagnosis not present

## 2014-04-27 DIAGNOSIS — D509 Iron deficiency anemia, unspecified: Secondary | ICD-10-CM | POA: Diagnosis not present

## 2014-04-27 DIAGNOSIS — D689 Coagulation defect, unspecified: Secondary | ICD-10-CM | POA: Diagnosis not present

## 2014-04-27 DIAGNOSIS — N186 End stage renal disease: Secondary | ICD-10-CM | POA: Diagnosis not present

## 2014-04-27 DIAGNOSIS — K760 Fatty (change of) liver, not elsewhere classified: Secondary | ICD-10-CM | POA: Diagnosis not present

## 2014-04-27 DIAGNOSIS — D631 Anemia in chronic kidney disease: Secondary | ICD-10-CM | POA: Diagnosis not present

## 2014-04-27 DIAGNOSIS — N2581 Secondary hyperparathyroidism of renal origin: Secondary | ICD-10-CM | POA: Diagnosis not present

## 2014-04-30 DIAGNOSIS — D631 Anemia in chronic kidney disease: Secondary | ICD-10-CM | POA: Diagnosis not present

## 2014-04-30 DIAGNOSIS — N2581 Secondary hyperparathyroidism of renal origin: Secondary | ICD-10-CM | POA: Diagnosis not present

## 2014-04-30 DIAGNOSIS — N186 End stage renal disease: Secondary | ICD-10-CM | POA: Diagnosis not present

## 2014-04-30 DIAGNOSIS — K760 Fatty (change of) liver, not elsewhere classified: Secondary | ICD-10-CM | POA: Diagnosis not present

## 2014-04-30 DIAGNOSIS — D509 Iron deficiency anemia, unspecified: Secondary | ICD-10-CM | POA: Diagnosis not present

## 2014-05-02 DIAGNOSIS — N2581 Secondary hyperparathyroidism of renal origin: Secondary | ICD-10-CM | POA: Diagnosis not present

## 2014-05-02 DIAGNOSIS — D631 Anemia in chronic kidney disease: Secondary | ICD-10-CM | POA: Diagnosis not present

## 2014-05-02 DIAGNOSIS — D509 Iron deficiency anemia, unspecified: Secondary | ICD-10-CM | POA: Diagnosis not present

## 2014-05-02 DIAGNOSIS — N186 End stage renal disease: Secondary | ICD-10-CM | POA: Diagnosis not present

## 2014-05-02 DIAGNOSIS — K760 Fatty (change of) liver, not elsewhere classified: Secondary | ICD-10-CM | POA: Diagnosis not present

## 2014-05-04 DIAGNOSIS — D509 Iron deficiency anemia, unspecified: Secondary | ICD-10-CM | POA: Diagnosis not present

## 2014-05-04 DIAGNOSIS — D631 Anemia in chronic kidney disease: Secondary | ICD-10-CM | POA: Diagnosis not present

## 2014-05-04 DIAGNOSIS — K760 Fatty (change of) liver, not elsewhere classified: Secondary | ICD-10-CM | POA: Diagnosis not present

## 2014-05-04 DIAGNOSIS — N2581 Secondary hyperparathyroidism of renal origin: Secondary | ICD-10-CM | POA: Diagnosis not present

## 2014-05-04 DIAGNOSIS — N186 End stage renal disease: Secondary | ICD-10-CM | POA: Diagnosis not present

## 2014-05-05 DIAGNOSIS — Z992 Dependence on renal dialysis: Secondary | ICD-10-CM | POA: Diagnosis not present

## 2014-05-05 DIAGNOSIS — N186 End stage renal disease: Secondary | ICD-10-CM | POA: Diagnosis not present

## 2014-05-07 DIAGNOSIS — N186 End stage renal disease: Secondary | ICD-10-CM | POA: Diagnosis not present

## 2014-05-07 DIAGNOSIS — D509 Iron deficiency anemia, unspecified: Secondary | ICD-10-CM | POA: Diagnosis not present

## 2014-05-07 DIAGNOSIS — N2581 Secondary hyperparathyroidism of renal origin: Secondary | ICD-10-CM | POA: Diagnosis not present

## 2014-05-07 DIAGNOSIS — D631 Anemia in chronic kidney disease: Secondary | ICD-10-CM | POA: Diagnosis not present

## 2014-05-07 DIAGNOSIS — K760 Fatty (change of) liver, not elsewhere classified: Secondary | ICD-10-CM | POA: Diagnosis not present

## 2014-05-17 DIAGNOSIS — I2699 Other pulmonary embolism without acute cor pulmonale: Secondary | ICD-10-CM | POA: Diagnosis not present

## 2014-05-19 ENCOUNTER — Encounter (HOSPITAL_COMMUNITY): Payer: Self-pay | Admitting: Surgery

## 2014-06-05 DIAGNOSIS — N186 End stage renal disease: Secondary | ICD-10-CM | POA: Diagnosis not present

## 2014-06-05 DIAGNOSIS — Z992 Dependence on renal dialysis: Secondary | ICD-10-CM | POA: Diagnosis not present

## 2014-06-07 DIAGNOSIS — N186 End stage renal disease: Secondary | ICD-10-CM | POA: Diagnosis not present

## 2014-06-07 DIAGNOSIS — N2581 Secondary hyperparathyroidism of renal origin: Secondary | ICD-10-CM | POA: Diagnosis not present

## 2014-06-07 DIAGNOSIS — Z23 Encounter for immunization: Secondary | ICD-10-CM | POA: Diagnosis not present

## 2014-06-07 DIAGNOSIS — D631 Anemia in chronic kidney disease: Secondary | ICD-10-CM | POA: Diagnosis not present

## 2014-06-07 DIAGNOSIS — D509 Iron deficiency anemia, unspecified: Secondary | ICD-10-CM | POA: Diagnosis not present

## 2014-06-07 DIAGNOSIS — K760 Fatty (change of) liver, not elsewhere classified: Secondary | ICD-10-CM | POA: Diagnosis not present

## 2014-06-10 DIAGNOSIS — K760 Fatty (change of) liver, not elsewhere classified: Secondary | ICD-10-CM | POA: Diagnosis not present

## 2014-06-10 DIAGNOSIS — Z23 Encounter for immunization: Secondary | ICD-10-CM | POA: Diagnosis not present

## 2014-06-10 DIAGNOSIS — D631 Anemia in chronic kidney disease: Secondary | ICD-10-CM | POA: Diagnosis not present

## 2014-06-10 DIAGNOSIS — D509 Iron deficiency anemia, unspecified: Secondary | ICD-10-CM | POA: Diagnosis not present

## 2014-06-10 DIAGNOSIS — N2581 Secondary hyperparathyroidism of renal origin: Secondary | ICD-10-CM | POA: Diagnosis not present

## 2014-06-10 DIAGNOSIS — N186 End stage renal disease: Secondary | ICD-10-CM | POA: Diagnosis not present

## 2014-06-10 IMAGING — CT CT ANGIO CHEST
2 of 6 series · 17 of 46 positions shown · IV contrast (omnipaque)
Comparison: Chest radiograph same day.

CLINICAL DATA: Hemoptysis, shortness of breath.

CT ANGIOGRAPHY CHEST
TECHNIQUE: Multidetector CT imaging of the chest using the
standard protocol during bolus administration of intravenous
contrast. Multiplanar reconstructed images including MIPs were
obtained and reviewed to evaluate the vascular anatomy.
Contrast: 80mL OMNIPAQUE IOHEXOL 350 MG/ML SOLN

[Series 6: pulm embolism 1.0 b25f thin · axial · 0.70mm/px · z∈[+1266,+1477]mm · 14 of 233 slices shown]
[im 11/233  lung]
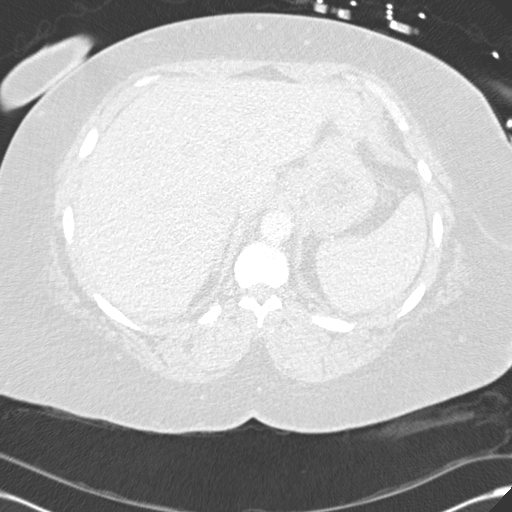
[im 31/233  soft-tissue]
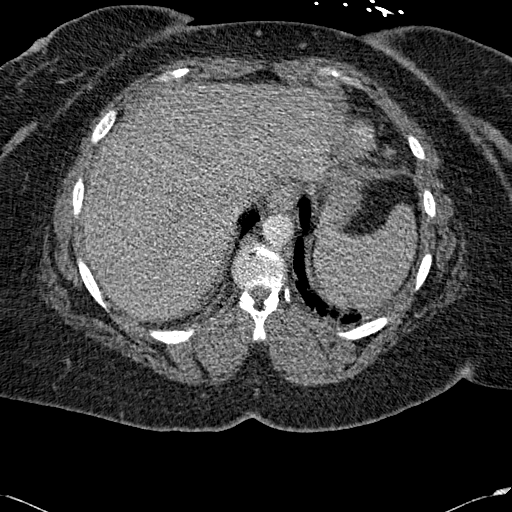
[im 41/233  lung]
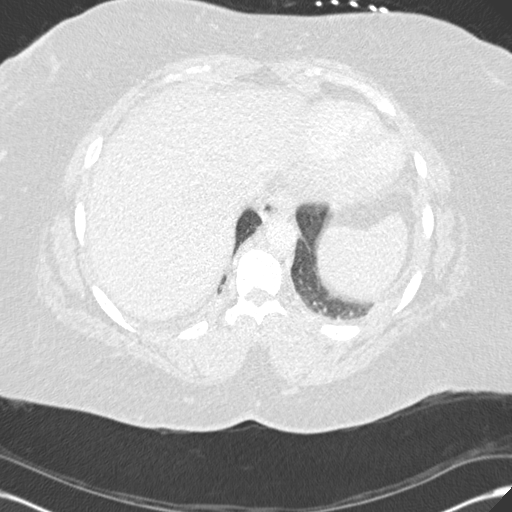
[im 61/233  soft-tissue]
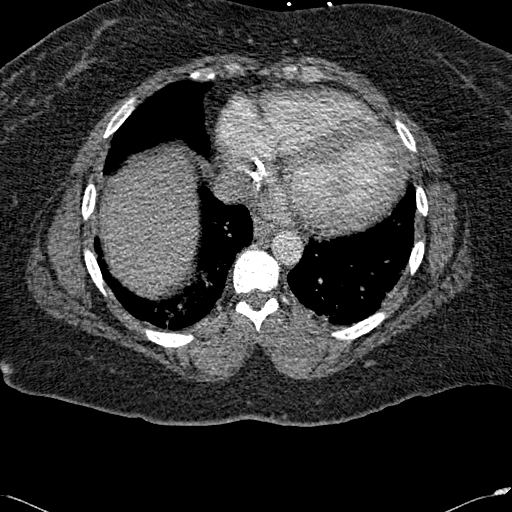
[im 81/233  lung]
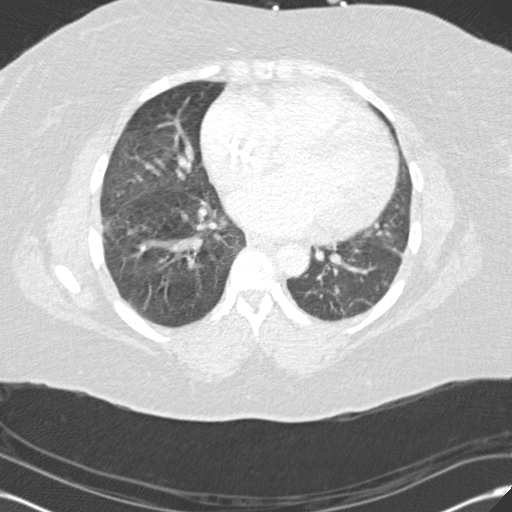
[im 91/233  soft-tissue]
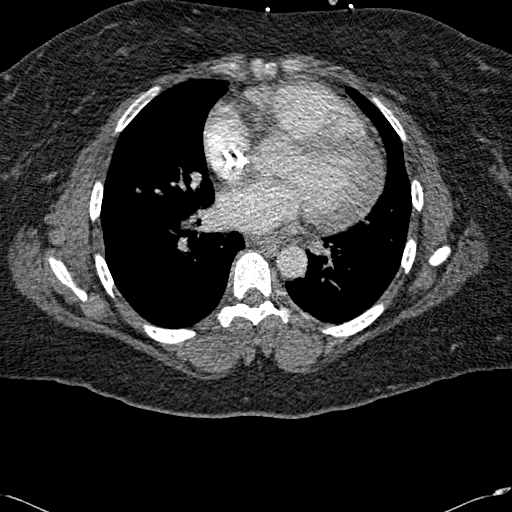
[im 111/233  lung]
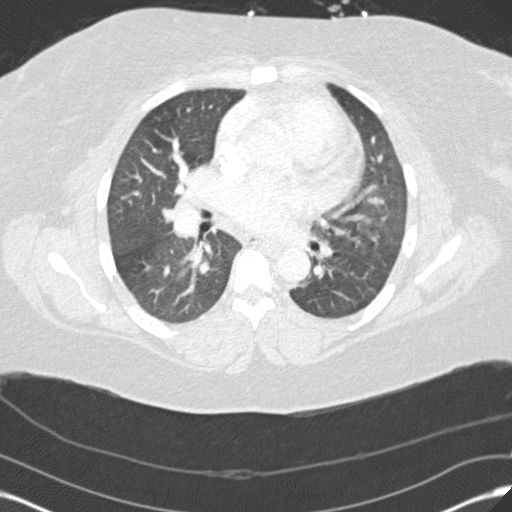
[im 122/233  soft-tissue]
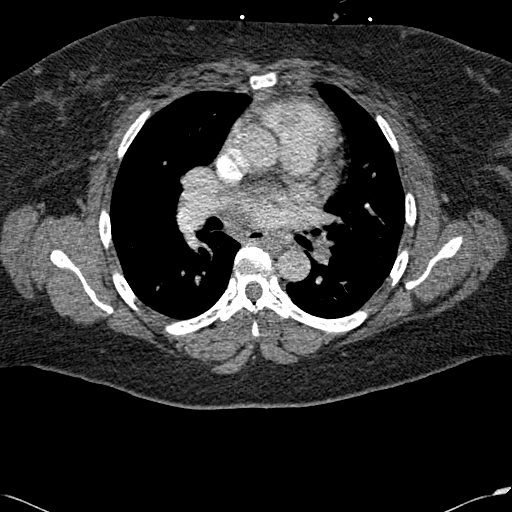
[im 142/233  lung]
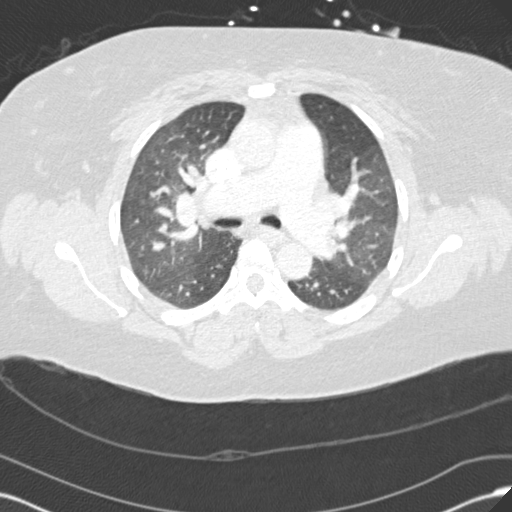
[im 152/233  soft-tissue]
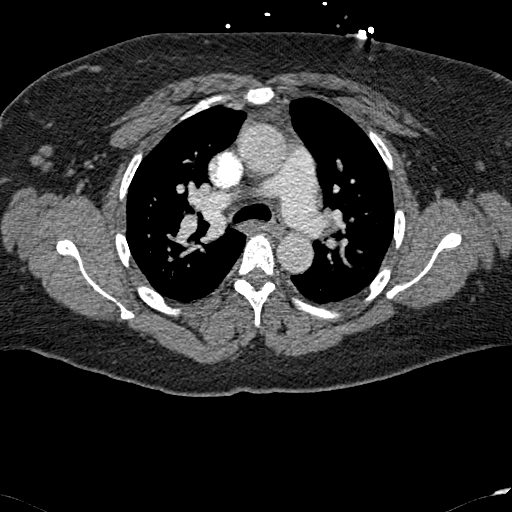
[im 172/233  lung]
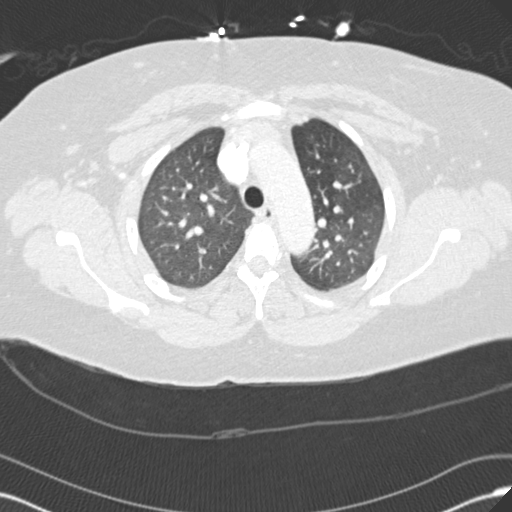
[im 192/233  soft-tissue]
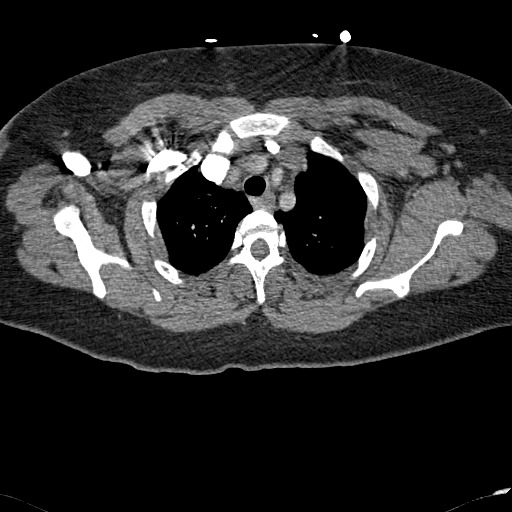
[im 202/233  lung]
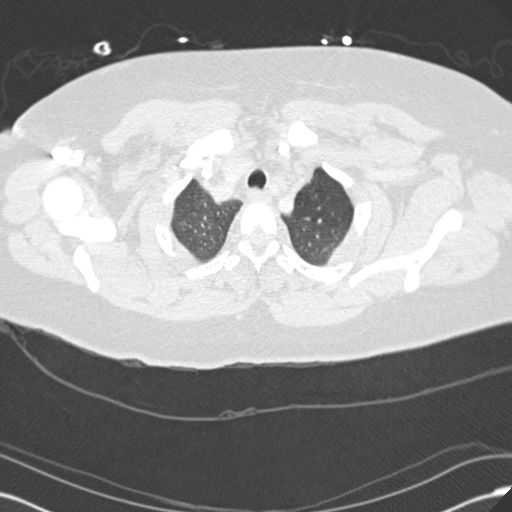
[im 222/233  soft-tissue]
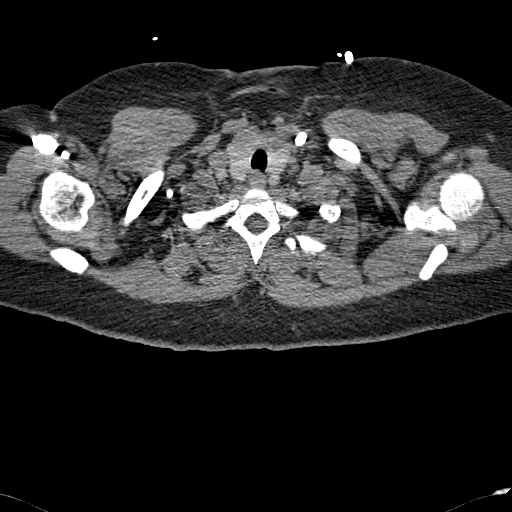

[Series 8: coronals · coronal · 0.47mm/px · 3 of 116 slices shown]
[im 29/116  soft-tissue]
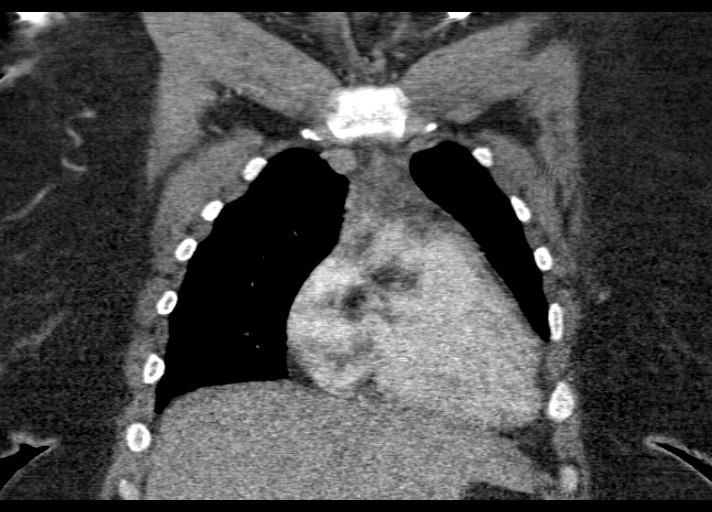
[im 58/116  soft-tissue]
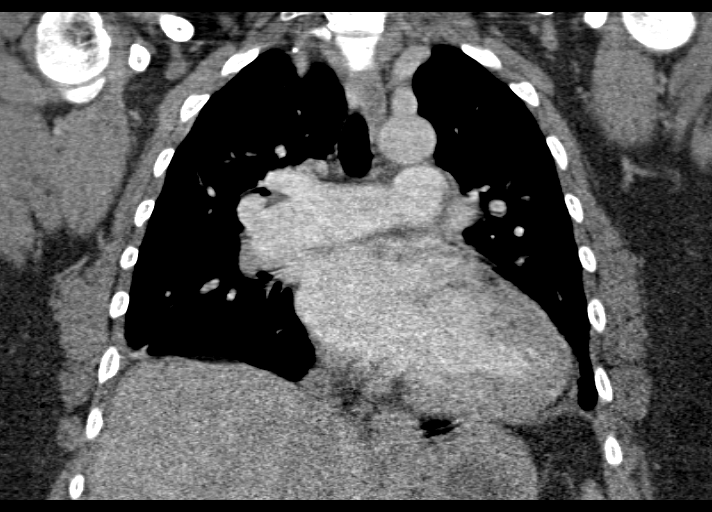
[im 87/116  soft-tissue]
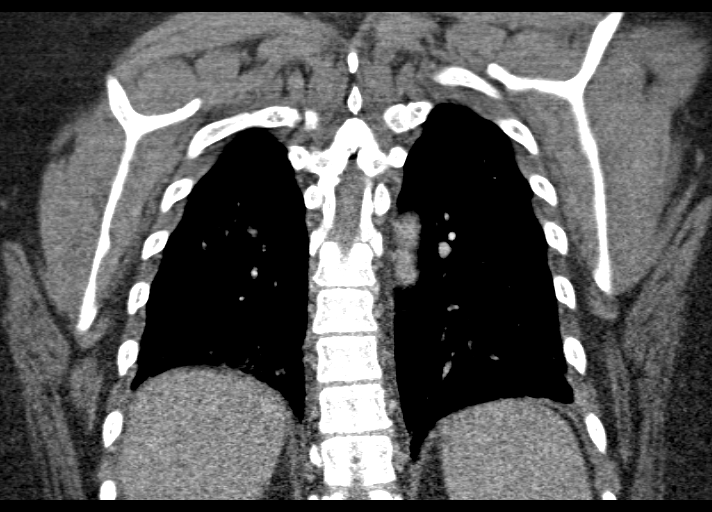

[17 of 46 positions shown; findings below may reference images not displayed]

FINDINGS: Examination was performed with a 22 gauge IV.
Opacification of the pulmonary arteries is suboptimal.  Together
with respiratory motion, evaluation of the segmental and
subsegmental pulmonary arteries is very limited.  There is probable
low attenuation thrombus at the bifurcation of the left upper and
left lower lobe pulmonary arteries (example image series 6, image
89).  Additional suspicious low attenuation thrombus is seen in the
left lower lobe pulmonary artery, at the lobar level (series 6,
image 110).

Probable thymic tissue in the prevascular space.  No pathologically
enlarged mediastinal, hilar or axillary lymph nodes.  Pulmonary
arteries are enlarged.  There does not appear to be straightening
of the interventricular septum.  Heart is mildly enlarged.
Dialysis catheter tips terminate in the right atrium.  No
pericardial effusion.

Subpleural atelectasis and/or scarring in both lower lobes and
lingula.  Mild added density throughout the lungs is most
consistent with expiratory phase imaging.  No pleural fluid.
Airway is otherwise unremarkable.

Incidental imaging of the upper abdomen no worrisome lytic or
sclerotic lesions.
IMPRESSION: 1.  Examination is limited by suboptimal opacification of the
pulmonary arteries and respiratory motion.  However, there are
suspected low attenuation filling defects in the distal left main
and left lower lobe pulmonary arteries, highly worrisome for
pulmonary emboli.  No evidence of right heart strain.
2.  Pulmonary arterial hypertension.

## 2014-06-13 DIAGNOSIS — N2581 Secondary hyperparathyroidism of renal origin: Secondary | ICD-10-CM | POA: Diagnosis not present

## 2014-06-13 DIAGNOSIS — K760 Fatty (change of) liver, not elsewhere classified: Secondary | ICD-10-CM | POA: Diagnosis not present

## 2014-06-13 DIAGNOSIS — D509 Iron deficiency anemia, unspecified: Secondary | ICD-10-CM | POA: Diagnosis not present

## 2014-06-13 DIAGNOSIS — Z23 Encounter for immunization: Secondary | ICD-10-CM | POA: Diagnosis not present

## 2014-06-13 DIAGNOSIS — D631 Anemia in chronic kidney disease: Secondary | ICD-10-CM | POA: Diagnosis not present

## 2014-06-13 DIAGNOSIS — N186 End stage renal disease: Secondary | ICD-10-CM | POA: Diagnosis not present

## 2014-06-15 DIAGNOSIS — N2581 Secondary hyperparathyroidism of renal origin: Secondary | ICD-10-CM | POA: Diagnosis not present

## 2014-06-15 DIAGNOSIS — D631 Anemia in chronic kidney disease: Secondary | ICD-10-CM | POA: Diagnosis not present

## 2014-06-15 DIAGNOSIS — D509 Iron deficiency anemia, unspecified: Secondary | ICD-10-CM | POA: Diagnosis not present

## 2014-06-15 DIAGNOSIS — N186 End stage renal disease: Secondary | ICD-10-CM | POA: Diagnosis not present

## 2014-06-15 DIAGNOSIS — Z23 Encounter for immunization: Secondary | ICD-10-CM | POA: Diagnosis not present

## 2014-06-15 DIAGNOSIS — K760 Fatty (change of) liver, not elsewhere classified: Secondary | ICD-10-CM | POA: Diagnosis not present

## 2014-06-15 DIAGNOSIS — I2699 Other pulmonary embolism without acute cor pulmonale: Secondary | ICD-10-CM | POA: Diagnosis not present

## 2014-06-17 DIAGNOSIS — N2581 Secondary hyperparathyroidism of renal origin: Secondary | ICD-10-CM | POA: Diagnosis not present

## 2014-06-17 DIAGNOSIS — D509 Iron deficiency anemia, unspecified: Secondary | ICD-10-CM | POA: Diagnosis not present

## 2014-06-17 DIAGNOSIS — D631 Anemia in chronic kidney disease: Secondary | ICD-10-CM | POA: Diagnosis not present

## 2014-06-17 DIAGNOSIS — K760 Fatty (change of) liver, not elsewhere classified: Secondary | ICD-10-CM | POA: Diagnosis not present

## 2014-06-17 DIAGNOSIS — N186 End stage renal disease: Secondary | ICD-10-CM | POA: Diagnosis not present

## 2014-06-17 DIAGNOSIS — Z23 Encounter for immunization: Secondary | ICD-10-CM | POA: Diagnosis not present

## 2014-06-20 DIAGNOSIS — N186 End stage renal disease: Secondary | ICD-10-CM | POA: Diagnosis not present

## 2014-06-20 DIAGNOSIS — D509 Iron deficiency anemia, unspecified: Secondary | ICD-10-CM | POA: Diagnosis not present

## 2014-06-20 DIAGNOSIS — Z23 Encounter for immunization: Secondary | ICD-10-CM | POA: Diagnosis not present

## 2014-06-20 DIAGNOSIS — D631 Anemia in chronic kidney disease: Secondary | ICD-10-CM | POA: Diagnosis not present

## 2014-06-20 DIAGNOSIS — K760 Fatty (change of) liver, not elsewhere classified: Secondary | ICD-10-CM | POA: Diagnosis not present

## 2014-06-20 DIAGNOSIS — N2581 Secondary hyperparathyroidism of renal origin: Secondary | ICD-10-CM | POA: Diagnosis not present

## 2014-06-22 DIAGNOSIS — Z23 Encounter for immunization: Secondary | ICD-10-CM | POA: Diagnosis not present

## 2014-06-22 DIAGNOSIS — D509 Iron deficiency anemia, unspecified: Secondary | ICD-10-CM | POA: Diagnosis not present

## 2014-06-22 DIAGNOSIS — N2581 Secondary hyperparathyroidism of renal origin: Secondary | ICD-10-CM | POA: Diagnosis not present

## 2014-06-22 DIAGNOSIS — D631 Anemia in chronic kidney disease: Secondary | ICD-10-CM | POA: Diagnosis not present

## 2014-06-22 DIAGNOSIS — I2699 Other pulmonary embolism without acute cor pulmonale: Secondary | ICD-10-CM | POA: Diagnosis not present

## 2014-06-22 DIAGNOSIS — N186 End stage renal disease: Secondary | ICD-10-CM | POA: Diagnosis not present

## 2014-06-22 DIAGNOSIS — K760 Fatty (change of) liver, not elsewhere classified: Secondary | ICD-10-CM | POA: Diagnosis not present

## 2014-06-24 DIAGNOSIS — Z23 Encounter for immunization: Secondary | ICD-10-CM | POA: Diagnosis not present

## 2014-06-24 DIAGNOSIS — K760 Fatty (change of) liver, not elsewhere classified: Secondary | ICD-10-CM | POA: Diagnosis not present

## 2014-06-24 DIAGNOSIS — N186 End stage renal disease: Secondary | ICD-10-CM | POA: Diagnosis not present

## 2014-06-24 DIAGNOSIS — N2581 Secondary hyperparathyroidism of renal origin: Secondary | ICD-10-CM | POA: Diagnosis not present

## 2014-06-24 DIAGNOSIS — D631 Anemia in chronic kidney disease: Secondary | ICD-10-CM | POA: Diagnosis not present

## 2014-06-24 DIAGNOSIS — D509 Iron deficiency anemia, unspecified: Secondary | ICD-10-CM | POA: Diagnosis not present

## 2014-06-27 DIAGNOSIS — D631 Anemia in chronic kidney disease: Secondary | ICD-10-CM | POA: Diagnosis not present

## 2014-06-27 DIAGNOSIS — K760 Fatty (change of) liver, not elsewhere classified: Secondary | ICD-10-CM | POA: Diagnosis not present

## 2014-06-27 DIAGNOSIS — N2581 Secondary hyperparathyroidism of renal origin: Secondary | ICD-10-CM | POA: Diagnosis not present

## 2014-06-27 DIAGNOSIS — Z23 Encounter for immunization: Secondary | ICD-10-CM | POA: Diagnosis not present

## 2014-06-27 DIAGNOSIS — N186 End stage renal disease: Secondary | ICD-10-CM | POA: Diagnosis not present

## 2014-06-27 DIAGNOSIS — D509 Iron deficiency anemia, unspecified: Secondary | ICD-10-CM | POA: Diagnosis not present

## 2014-06-30 DIAGNOSIS — Z23 Encounter for immunization: Secondary | ICD-10-CM | POA: Diagnosis not present

## 2014-06-30 DIAGNOSIS — D631 Anemia in chronic kidney disease: Secondary | ICD-10-CM | POA: Diagnosis not present

## 2014-06-30 DIAGNOSIS — N186 End stage renal disease: Secondary | ICD-10-CM | POA: Diagnosis not present

## 2014-06-30 DIAGNOSIS — K760 Fatty (change of) liver, not elsewhere classified: Secondary | ICD-10-CM | POA: Diagnosis not present

## 2014-06-30 DIAGNOSIS — D509 Iron deficiency anemia, unspecified: Secondary | ICD-10-CM | POA: Diagnosis not present

## 2014-06-30 DIAGNOSIS — N2581 Secondary hyperparathyroidism of renal origin: Secondary | ICD-10-CM | POA: Diagnosis not present

## 2014-07-01 DIAGNOSIS — N186 End stage renal disease: Secondary | ICD-10-CM | POA: Diagnosis not present

## 2014-07-01 DIAGNOSIS — N2581 Secondary hyperparathyroidism of renal origin: Secondary | ICD-10-CM | POA: Diagnosis not present

## 2014-07-01 DIAGNOSIS — D631 Anemia in chronic kidney disease: Secondary | ICD-10-CM | POA: Diagnosis not present

## 2014-07-01 DIAGNOSIS — D509 Iron deficiency anemia, unspecified: Secondary | ICD-10-CM | POA: Diagnosis not present

## 2014-07-01 DIAGNOSIS — Z23 Encounter for immunization: Secondary | ICD-10-CM | POA: Diagnosis not present

## 2014-07-01 DIAGNOSIS — K760 Fatty (change of) liver, not elsewhere classified: Secondary | ICD-10-CM | POA: Diagnosis not present

## 2014-07-04 DIAGNOSIS — N2581 Secondary hyperparathyroidism of renal origin: Secondary | ICD-10-CM | POA: Diagnosis not present

## 2014-07-04 DIAGNOSIS — D509 Iron deficiency anemia, unspecified: Secondary | ICD-10-CM | POA: Diagnosis not present

## 2014-07-04 DIAGNOSIS — N186 End stage renal disease: Secondary | ICD-10-CM | POA: Diagnosis not present

## 2014-07-04 DIAGNOSIS — Z992 Dependence on renal dialysis: Secondary | ICD-10-CM | POA: Diagnosis not present

## 2014-07-04 DIAGNOSIS — K760 Fatty (change of) liver, not elsewhere classified: Secondary | ICD-10-CM | POA: Diagnosis not present

## 2014-07-04 DIAGNOSIS — D631 Anemia in chronic kidney disease: Secondary | ICD-10-CM | POA: Diagnosis not present

## 2014-07-04 DIAGNOSIS — Z23 Encounter for immunization: Secondary | ICD-10-CM | POA: Diagnosis not present

## 2014-07-08 DIAGNOSIS — K760 Fatty (change of) liver, not elsewhere classified: Secondary | ICD-10-CM | POA: Diagnosis not present

## 2014-07-08 DIAGNOSIS — D509 Iron deficiency anemia, unspecified: Secondary | ICD-10-CM | POA: Diagnosis not present

## 2014-07-08 DIAGNOSIS — N2581 Secondary hyperparathyroidism of renal origin: Secondary | ICD-10-CM | POA: Diagnosis not present

## 2014-07-08 DIAGNOSIS — N186 End stage renal disease: Secondary | ICD-10-CM | POA: Diagnosis not present

## 2014-07-08 DIAGNOSIS — D631 Anemia in chronic kidney disease: Secondary | ICD-10-CM | POA: Diagnosis not present

## 2014-07-11 DIAGNOSIS — D509 Iron deficiency anemia, unspecified: Secondary | ICD-10-CM | POA: Diagnosis not present

## 2014-07-11 DIAGNOSIS — N186 End stage renal disease: Secondary | ICD-10-CM | POA: Diagnosis not present

## 2014-07-11 DIAGNOSIS — K760 Fatty (change of) liver, not elsewhere classified: Secondary | ICD-10-CM | POA: Diagnosis not present

## 2014-07-11 DIAGNOSIS — D631 Anemia in chronic kidney disease: Secondary | ICD-10-CM | POA: Diagnosis not present

## 2014-07-11 DIAGNOSIS — N2581 Secondary hyperparathyroidism of renal origin: Secondary | ICD-10-CM | POA: Diagnosis not present

## 2014-07-13 DIAGNOSIS — K760 Fatty (change of) liver, not elsewhere classified: Secondary | ICD-10-CM | POA: Diagnosis not present

## 2014-07-13 DIAGNOSIS — D509 Iron deficiency anemia, unspecified: Secondary | ICD-10-CM | POA: Diagnosis not present

## 2014-07-13 DIAGNOSIS — N2581 Secondary hyperparathyroidism of renal origin: Secondary | ICD-10-CM | POA: Diagnosis not present

## 2014-07-13 DIAGNOSIS — I2699 Other pulmonary embolism without acute cor pulmonale: Secondary | ICD-10-CM | POA: Diagnosis not present

## 2014-07-13 DIAGNOSIS — D631 Anemia in chronic kidney disease: Secondary | ICD-10-CM | POA: Diagnosis not present

## 2014-07-13 DIAGNOSIS — N186 End stage renal disease: Secondary | ICD-10-CM | POA: Diagnosis not present

## 2014-07-15 DIAGNOSIS — D509 Iron deficiency anemia, unspecified: Secondary | ICD-10-CM | POA: Diagnosis not present

## 2014-07-15 DIAGNOSIS — D631 Anemia in chronic kidney disease: Secondary | ICD-10-CM | POA: Diagnosis not present

## 2014-07-15 DIAGNOSIS — N2581 Secondary hyperparathyroidism of renal origin: Secondary | ICD-10-CM | POA: Diagnosis not present

## 2014-07-15 DIAGNOSIS — K760 Fatty (change of) liver, not elsewhere classified: Secondary | ICD-10-CM | POA: Diagnosis not present

## 2014-07-15 DIAGNOSIS — N186 End stage renal disease: Secondary | ICD-10-CM | POA: Diagnosis not present

## 2014-07-18 DIAGNOSIS — D631 Anemia in chronic kidney disease: Secondary | ICD-10-CM | POA: Diagnosis not present

## 2014-07-18 DIAGNOSIS — N186 End stage renal disease: Secondary | ICD-10-CM | POA: Diagnosis not present

## 2014-07-18 DIAGNOSIS — N2581 Secondary hyperparathyroidism of renal origin: Secondary | ICD-10-CM | POA: Diagnosis not present

## 2014-07-18 DIAGNOSIS — K760 Fatty (change of) liver, not elsewhere classified: Secondary | ICD-10-CM | POA: Diagnosis not present

## 2014-07-18 DIAGNOSIS — D509 Iron deficiency anemia, unspecified: Secondary | ICD-10-CM | POA: Diagnosis not present

## 2014-07-18 DIAGNOSIS — I2699 Other pulmonary embolism without acute cor pulmonale: Secondary | ICD-10-CM | POA: Diagnosis not present

## 2014-07-20 DIAGNOSIS — D631 Anemia in chronic kidney disease: Secondary | ICD-10-CM | POA: Diagnosis not present

## 2014-07-20 DIAGNOSIS — N186 End stage renal disease: Secondary | ICD-10-CM | POA: Diagnosis not present

## 2014-07-20 DIAGNOSIS — D509 Iron deficiency anemia, unspecified: Secondary | ICD-10-CM | POA: Diagnosis not present

## 2014-07-20 DIAGNOSIS — K760 Fatty (change of) liver, not elsewhere classified: Secondary | ICD-10-CM | POA: Diagnosis not present

## 2014-07-20 DIAGNOSIS — N2581 Secondary hyperparathyroidism of renal origin: Secondary | ICD-10-CM | POA: Diagnosis not present

## 2014-07-23 DIAGNOSIS — K760 Fatty (change of) liver, not elsewhere classified: Secondary | ICD-10-CM | POA: Diagnosis not present

## 2014-07-23 DIAGNOSIS — D631 Anemia in chronic kidney disease: Secondary | ICD-10-CM | POA: Diagnosis not present

## 2014-07-23 DIAGNOSIS — N186 End stage renal disease: Secondary | ICD-10-CM | POA: Diagnosis not present

## 2014-07-23 DIAGNOSIS — D509 Iron deficiency anemia, unspecified: Secondary | ICD-10-CM | POA: Diagnosis not present

## 2014-07-23 DIAGNOSIS — N2581 Secondary hyperparathyroidism of renal origin: Secondary | ICD-10-CM | POA: Diagnosis not present

## 2014-07-25 DIAGNOSIS — N2581 Secondary hyperparathyroidism of renal origin: Secondary | ICD-10-CM | POA: Diagnosis not present

## 2014-07-25 DIAGNOSIS — K760 Fatty (change of) liver, not elsewhere classified: Secondary | ICD-10-CM | POA: Diagnosis not present

## 2014-07-25 DIAGNOSIS — D509 Iron deficiency anemia, unspecified: Secondary | ICD-10-CM | POA: Diagnosis not present

## 2014-07-25 DIAGNOSIS — D631 Anemia in chronic kidney disease: Secondary | ICD-10-CM | POA: Diagnosis not present

## 2014-07-25 DIAGNOSIS — N186 End stage renal disease: Secondary | ICD-10-CM | POA: Diagnosis not present

## 2014-07-25 DIAGNOSIS — I2699 Other pulmonary embolism without acute cor pulmonale: Secondary | ICD-10-CM | POA: Diagnosis not present

## 2014-07-27 DIAGNOSIS — N2581 Secondary hyperparathyroidism of renal origin: Secondary | ICD-10-CM | POA: Diagnosis not present

## 2014-07-27 DIAGNOSIS — K760 Fatty (change of) liver, not elsewhere classified: Secondary | ICD-10-CM | POA: Diagnosis not present

## 2014-07-27 DIAGNOSIS — D631 Anemia in chronic kidney disease: Secondary | ICD-10-CM | POA: Diagnosis not present

## 2014-07-27 DIAGNOSIS — N186 End stage renal disease: Secondary | ICD-10-CM | POA: Diagnosis not present

## 2014-07-27 DIAGNOSIS — D509 Iron deficiency anemia, unspecified: Secondary | ICD-10-CM | POA: Diagnosis not present

## 2014-07-29 DIAGNOSIS — N186 End stage renal disease: Secondary | ICD-10-CM | POA: Diagnosis not present

## 2014-07-29 DIAGNOSIS — K760 Fatty (change of) liver, not elsewhere classified: Secondary | ICD-10-CM | POA: Diagnosis not present

## 2014-07-29 DIAGNOSIS — D509 Iron deficiency anemia, unspecified: Secondary | ICD-10-CM | POA: Diagnosis not present

## 2014-07-29 DIAGNOSIS — N2581 Secondary hyperparathyroidism of renal origin: Secondary | ICD-10-CM | POA: Diagnosis not present

## 2014-07-29 DIAGNOSIS — D631 Anemia in chronic kidney disease: Secondary | ICD-10-CM | POA: Diagnosis not present

## 2014-08-01 DIAGNOSIS — I2699 Other pulmonary embolism without acute cor pulmonale: Secondary | ICD-10-CM | POA: Diagnosis not present

## 2014-08-01 DIAGNOSIS — D509 Iron deficiency anemia, unspecified: Secondary | ICD-10-CM | POA: Diagnosis not present

## 2014-08-01 DIAGNOSIS — N186 End stage renal disease: Secondary | ICD-10-CM | POA: Diagnosis not present

## 2014-08-01 DIAGNOSIS — D631 Anemia in chronic kidney disease: Secondary | ICD-10-CM | POA: Diagnosis not present

## 2014-08-01 DIAGNOSIS — K760 Fatty (change of) liver, not elsewhere classified: Secondary | ICD-10-CM | POA: Diagnosis not present

## 2014-08-01 DIAGNOSIS — N2581 Secondary hyperparathyroidism of renal origin: Secondary | ICD-10-CM | POA: Diagnosis not present

## 2014-08-03 DIAGNOSIS — D509 Iron deficiency anemia, unspecified: Secondary | ICD-10-CM | POA: Diagnosis not present

## 2014-08-03 DIAGNOSIS — K760 Fatty (change of) liver, not elsewhere classified: Secondary | ICD-10-CM | POA: Diagnosis not present

## 2014-08-03 DIAGNOSIS — N186 End stage renal disease: Secondary | ICD-10-CM | POA: Diagnosis not present

## 2014-08-03 DIAGNOSIS — N2581 Secondary hyperparathyroidism of renal origin: Secondary | ICD-10-CM | POA: Diagnosis not present

## 2014-08-03 DIAGNOSIS — D631 Anemia in chronic kidney disease: Secondary | ICD-10-CM | POA: Diagnosis not present

## 2014-08-04 DIAGNOSIS — N186 End stage renal disease: Secondary | ICD-10-CM | POA: Diagnosis not present

## 2014-08-04 DIAGNOSIS — Z992 Dependence on renal dialysis: Secondary | ICD-10-CM | POA: Diagnosis not present

## 2014-08-05 DIAGNOSIS — D631 Anemia in chronic kidney disease: Secondary | ICD-10-CM | POA: Diagnosis not present

## 2014-08-05 DIAGNOSIS — N2581 Secondary hyperparathyroidism of renal origin: Secondary | ICD-10-CM | POA: Diagnosis not present

## 2014-08-05 DIAGNOSIS — D509 Iron deficiency anemia, unspecified: Secondary | ICD-10-CM | POA: Diagnosis not present

## 2014-08-05 DIAGNOSIS — K760 Fatty (change of) liver, not elsewhere classified: Secondary | ICD-10-CM | POA: Diagnosis not present

## 2014-08-05 DIAGNOSIS — N186 End stage renal disease: Secondary | ICD-10-CM | POA: Diagnosis not present

## 2014-08-08 DIAGNOSIS — I2699 Other pulmonary embolism without acute cor pulmonale: Secondary | ICD-10-CM | POA: Diagnosis not present

## 2014-08-15 DIAGNOSIS — I2699 Other pulmonary embolism without acute cor pulmonale: Secondary | ICD-10-CM | POA: Diagnosis not present

## 2014-08-17 DIAGNOSIS — I2699 Other pulmonary embolism without acute cor pulmonale: Secondary | ICD-10-CM | POA: Diagnosis not present

## 2014-08-24 DIAGNOSIS — I2699 Other pulmonary embolism without acute cor pulmonale: Secondary | ICD-10-CM | POA: Diagnosis not present

## 2014-08-29 DIAGNOSIS — I2699 Other pulmonary embolism without acute cor pulmonale: Secondary | ICD-10-CM | POA: Diagnosis not present

## 2014-09-03 DIAGNOSIS — Q612 Polycystic kidney, adult type: Secondary | ICD-10-CM | POA: Diagnosis not present

## 2014-09-03 DIAGNOSIS — N186 End stage renal disease: Secondary | ICD-10-CM | POA: Diagnosis not present

## 2014-09-03 DIAGNOSIS — Z992 Dependence on renal dialysis: Secondary | ICD-10-CM | POA: Diagnosis not present

## 2014-09-05 DIAGNOSIS — N2581 Secondary hyperparathyroidism of renal origin: Secondary | ICD-10-CM | POA: Diagnosis not present

## 2014-09-05 DIAGNOSIS — N186 End stage renal disease: Secondary | ICD-10-CM | POA: Diagnosis not present

## 2014-09-05 DIAGNOSIS — D509 Iron deficiency anemia, unspecified: Secondary | ICD-10-CM | POA: Diagnosis not present

## 2014-09-05 DIAGNOSIS — I2699 Other pulmonary embolism without acute cor pulmonale: Secondary | ICD-10-CM | POA: Diagnosis not present

## 2014-09-05 DIAGNOSIS — D631 Anemia in chronic kidney disease: Secondary | ICD-10-CM | POA: Diagnosis not present

## 2014-09-05 DIAGNOSIS — K760 Fatty (change of) liver, not elsewhere classified: Secondary | ICD-10-CM | POA: Diagnosis not present

## 2014-09-12 DIAGNOSIS — I2699 Other pulmonary embolism without acute cor pulmonale: Secondary | ICD-10-CM | POA: Diagnosis not present

## 2014-09-14 DIAGNOSIS — I2699 Other pulmonary embolism without acute cor pulmonale: Secondary | ICD-10-CM | POA: Diagnosis not present

## 2014-09-19 DIAGNOSIS — I2699 Other pulmonary embolism without acute cor pulmonale: Secondary | ICD-10-CM | POA: Diagnosis not present

## 2014-09-26 DIAGNOSIS — I2699 Other pulmonary embolism without acute cor pulmonale: Secondary | ICD-10-CM | POA: Diagnosis not present

## 2014-10-03 DIAGNOSIS — I2699 Other pulmonary embolism without acute cor pulmonale: Secondary | ICD-10-CM | POA: Diagnosis not present

## 2014-10-04 DIAGNOSIS — Z992 Dependence on renal dialysis: Secondary | ICD-10-CM | POA: Diagnosis not present

## 2014-10-04 DIAGNOSIS — Q612 Polycystic kidney, adult type: Secondary | ICD-10-CM | POA: Diagnosis not present

## 2014-10-04 DIAGNOSIS — N186 End stage renal disease: Secondary | ICD-10-CM | POA: Diagnosis not present

## 2014-10-05 DIAGNOSIS — D509 Iron deficiency anemia, unspecified: Secondary | ICD-10-CM | POA: Diagnosis not present

## 2014-10-05 DIAGNOSIS — N2581 Secondary hyperparathyroidism of renal origin: Secondary | ICD-10-CM | POA: Diagnosis not present

## 2014-10-05 DIAGNOSIS — D631 Anemia in chronic kidney disease: Secondary | ICD-10-CM | POA: Diagnosis not present

## 2014-10-05 DIAGNOSIS — N186 End stage renal disease: Secondary | ICD-10-CM | POA: Diagnosis not present

## 2014-10-05 DIAGNOSIS — K760 Fatty (change of) liver, not elsewhere classified: Secondary | ICD-10-CM | POA: Diagnosis not present

## 2014-10-10 DIAGNOSIS — I2699 Other pulmonary embolism without acute cor pulmonale: Secondary | ICD-10-CM | POA: Diagnosis not present

## 2014-10-12 DIAGNOSIS — I2699 Other pulmonary embolism without acute cor pulmonale: Secondary | ICD-10-CM | POA: Diagnosis not present

## 2014-10-17 DIAGNOSIS — I2699 Other pulmonary embolism without acute cor pulmonale: Secondary | ICD-10-CM | POA: Diagnosis not present

## 2014-10-20 ENCOUNTER — Encounter (HOSPITAL_COMMUNITY): Payer: Self-pay | Admitting: Emergency Medicine

## 2014-10-20 ENCOUNTER — Emergency Department (INDEPENDENT_AMBULATORY_CARE_PROVIDER_SITE_OTHER)
Admission: EM | Admit: 2014-10-20 | Discharge: 2014-10-20 | Disposition: A | Discharge status: Home / Self Care | Payer: Medicare Other | Source: Home / Self Care | Attending: Family Medicine | Admitting: Family Medicine

## 2014-10-20 DIAGNOSIS — T700XXA Otitic barotrauma, initial encounter: Secondary | ICD-10-CM | POA: Diagnosis not present

## 2014-10-20 DIAGNOSIS — R000 Tachycardia, unspecified: Secondary | ICD-10-CM

## 2014-10-20 MED ORDER — PREDNISONE 10 MG PO TABS: 30.0000 mg | ORAL_TABLET | Freq: Every day | ORAL | Status: DC
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
## 2014-10-20 NOTE — Discharge Instructions (Signed)
Thank you for coming in today. Follow-up with your regular doctor Take prednisone for significant ear pressure. This medicine will increase her blood pressure, blood sugar and make you jittery and will interfere with your warfarin level. Your heart rate is quite elevated. This is apparently normal for you. Follow-up with your dialysis doctors about your heart rate   Barotitis Media Barotitis media is inflammation of your middle ear. This occurs when the auditory tube (eustachian tube) leading from the back of your nose (nasopharynx) to your eardrum is blocked. This blockage may result from a cold, environmental allergies, or an upper respiratory infection. Unresolved barotitis media may lead to damage or hearing loss (barotrauma), which may become permanent. HOME CARE INSTRUCTIONS   Use medicines as recommended by your health care provider. Over-the-counter medicines will help unblock the canal and can help during times of air travel.  Do not put anything into your ears to clean or unplug them. Eardrops will not be helpful.  Do not swim, dive, or fly until your health care provider says it is all right to do so. If these activities are necessary, chewing gum with frequent, forceful swallowing may help. It is also helpful to hold your nose and gently blow to pop your ears for equalizing pressure changes. This forces air into the eustachian tube.  Only take over-the-counter or prescription medicines for pain, discomfort, or fever as directed by your health care provider.  A decongestant may be helpful in decongesting the middle ear and make pressure equalization easier. SEEK MEDICAL CARE IF:  You experience a serious form of dizziness in which you feel as if the room is spinning and you feel nauseated (vertigo).  Your symptoms only involve one ear. SEEK IMMEDIATE MEDICAL CARE IF:   You develop a severe headache, dizziness, or severe ear pain.  You have bloody or pus-like drainage from your  ears.  You develop a fever.  Your problems do not improve or become worse. MAKE SURE YOU:   Understand these instructions.  Will watch your condition.  Will get help right away if you are not doing well or get worse. Document Released: 04/19/2000 Document Revised: 02/10/2013 Document Reviewed: 11/17/2012 [facility] Patient Information 2015 [place], [place]. This information is not intended to replace advice given to you by your health care provider. Make sure you discuss any questions you have with your health care provider.

## 2014-10-20 NOTE — ED Notes (Signed)
C/o ear pain

## 2014-10-24 DIAGNOSIS — I2699 Other pulmonary embolism without acute cor pulmonale: Secondary | ICD-10-CM | POA: Diagnosis not present

## 2014-11-03 DIAGNOSIS — Z992 Dependence on renal dialysis: Secondary | ICD-10-CM | POA: Diagnosis not present

## 2014-11-03 DIAGNOSIS — Q612 Polycystic kidney, adult type: Secondary | ICD-10-CM | POA: Diagnosis not present

## 2014-11-03 DIAGNOSIS — N186 End stage renal disease: Secondary | ICD-10-CM | POA: Diagnosis not present

## 2014-11-05 DIAGNOSIS — D509 Iron deficiency anemia, unspecified: Secondary | ICD-10-CM | POA: Diagnosis not present

## 2014-11-05 DIAGNOSIS — N2581 Secondary hyperparathyroidism of renal origin: Secondary | ICD-10-CM | POA: Diagnosis not present

## 2014-11-05 DIAGNOSIS — N186 End stage renal disease: Secondary | ICD-10-CM | POA: Diagnosis not present

## 2014-11-05 DIAGNOSIS — D631 Anemia in chronic kidney disease: Secondary | ICD-10-CM | POA: Diagnosis not present

## 2014-11-05 DIAGNOSIS — K760 Fatty (change of) liver, not elsewhere classified: Secondary | ICD-10-CM | POA: Diagnosis not present

## 2014-11-07 DIAGNOSIS — D509 Iron deficiency anemia, unspecified: Secondary | ICD-10-CM | POA: Diagnosis not present

## 2014-11-07 DIAGNOSIS — N186 End stage renal disease: Secondary | ICD-10-CM | POA: Diagnosis not present

## 2014-11-07 DIAGNOSIS — I2699 Other pulmonary embolism without acute cor pulmonale: Secondary | ICD-10-CM | POA: Diagnosis not present

## 2014-11-07 DIAGNOSIS — N2581 Secondary hyperparathyroidism of renal origin: Secondary | ICD-10-CM | POA: Diagnosis not present

## 2014-11-07 DIAGNOSIS — D631 Anemia in chronic kidney disease: Secondary | ICD-10-CM | POA: Diagnosis not present

## 2014-11-07 DIAGNOSIS — K760 Fatty (change of) liver, not elsewhere classified: Secondary | ICD-10-CM | POA: Diagnosis not present

## 2014-11-09 DIAGNOSIS — K760 Fatty (change of) liver, not elsewhere classified: Secondary | ICD-10-CM | POA: Diagnosis not present

## 2014-11-09 DIAGNOSIS — D509 Iron deficiency anemia, unspecified: Secondary | ICD-10-CM | POA: Diagnosis not present

## 2014-11-09 DIAGNOSIS — N186 End stage renal disease: Secondary | ICD-10-CM | POA: Diagnosis not present

## 2014-11-09 DIAGNOSIS — D631 Anemia in chronic kidney disease: Secondary | ICD-10-CM | POA: Diagnosis not present

## 2014-11-09 DIAGNOSIS — N2581 Secondary hyperparathyroidism of renal origin: Secondary | ICD-10-CM | POA: Diagnosis not present

## 2014-11-11 DIAGNOSIS — K760 Fatty (change of) liver, not elsewhere classified: Secondary | ICD-10-CM | POA: Diagnosis not present

## 2014-11-11 DIAGNOSIS — D631 Anemia in chronic kidney disease: Secondary | ICD-10-CM | POA: Diagnosis not present

## 2014-11-11 DIAGNOSIS — N2581 Secondary hyperparathyroidism of renal origin: Secondary | ICD-10-CM | POA: Diagnosis not present

## 2014-11-11 DIAGNOSIS — N186 End stage renal disease: Secondary | ICD-10-CM | POA: Diagnosis not present

## 2014-11-11 DIAGNOSIS — D509 Iron deficiency anemia, unspecified: Secondary | ICD-10-CM | POA: Diagnosis not present

## 2014-11-14 DIAGNOSIS — N186 End stage renal disease: Secondary | ICD-10-CM | POA: Diagnosis not present

## 2014-11-14 DIAGNOSIS — I2699 Other pulmonary embolism without acute cor pulmonale: Secondary | ICD-10-CM | POA: Diagnosis not present

## 2014-11-14 DIAGNOSIS — D631 Anemia in chronic kidney disease: Secondary | ICD-10-CM | POA: Diagnosis not present

## 2014-11-14 DIAGNOSIS — N2581 Secondary hyperparathyroidism of renal origin: Secondary | ICD-10-CM | POA: Diagnosis not present

## 2014-11-14 DIAGNOSIS — D509 Iron deficiency anemia, unspecified: Secondary | ICD-10-CM | POA: Diagnosis not present

## 2014-11-14 DIAGNOSIS — K760 Fatty (change of) liver, not elsewhere classified: Secondary | ICD-10-CM | POA: Diagnosis not present

## 2014-11-16 DIAGNOSIS — N2581 Secondary hyperparathyroidism of renal origin: Secondary | ICD-10-CM | POA: Diagnosis not present

## 2014-11-16 DIAGNOSIS — D509 Iron deficiency anemia, unspecified: Secondary | ICD-10-CM | POA: Diagnosis not present

## 2014-11-16 DIAGNOSIS — I2699 Other pulmonary embolism without acute cor pulmonale: Secondary | ICD-10-CM | POA: Diagnosis not present

## 2014-11-16 DIAGNOSIS — D631 Anemia in chronic kidney disease: Secondary | ICD-10-CM | POA: Diagnosis not present

## 2014-11-16 DIAGNOSIS — N186 End stage renal disease: Secondary | ICD-10-CM | POA: Diagnosis not present

## 2014-11-16 DIAGNOSIS — K760 Fatty (change of) liver, not elsewhere classified: Secondary | ICD-10-CM | POA: Diagnosis not present

## 2014-11-18 DIAGNOSIS — D509 Iron deficiency anemia, unspecified: Secondary | ICD-10-CM | POA: Diagnosis not present

## 2014-11-18 DIAGNOSIS — K760 Fatty (change of) liver, not elsewhere classified: Secondary | ICD-10-CM | POA: Diagnosis not present

## 2014-11-18 DIAGNOSIS — N186 End stage renal disease: Secondary | ICD-10-CM | POA: Diagnosis not present

## 2014-11-18 DIAGNOSIS — D631 Anemia in chronic kidney disease: Secondary | ICD-10-CM | POA: Diagnosis not present

## 2014-11-18 DIAGNOSIS — N2581 Secondary hyperparathyroidism of renal origin: Secondary | ICD-10-CM | POA: Diagnosis not present

## 2014-11-22 DIAGNOSIS — D631 Anemia in chronic kidney disease: Secondary | ICD-10-CM | POA: Diagnosis not present

## 2014-11-22 DIAGNOSIS — N2581 Secondary hyperparathyroidism of renal origin: Secondary | ICD-10-CM | POA: Diagnosis not present

## 2014-11-22 DIAGNOSIS — N186 End stage renal disease: Secondary | ICD-10-CM | POA: Diagnosis not present

## 2014-11-22 DIAGNOSIS — D509 Iron deficiency anemia, unspecified: Secondary | ICD-10-CM | POA: Diagnosis not present

## 2014-11-22 DIAGNOSIS — K760 Fatty (change of) liver, not elsewhere classified: Secondary | ICD-10-CM | POA: Diagnosis not present

## 2014-11-22 DIAGNOSIS — I2699 Other pulmonary embolism without acute cor pulmonale: Secondary | ICD-10-CM | POA: Diagnosis not present

## 2014-11-23 DIAGNOSIS — K760 Fatty (change of) liver, not elsewhere classified: Secondary | ICD-10-CM | POA: Diagnosis not present

## 2014-11-23 DIAGNOSIS — N186 End stage renal disease: Secondary | ICD-10-CM | POA: Diagnosis not present

## 2014-11-23 DIAGNOSIS — N2581 Secondary hyperparathyroidism of renal origin: Secondary | ICD-10-CM | POA: Diagnosis not present

## 2014-11-23 DIAGNOSIS — D631 Anemia in chronic kidney disease: Secondary | ICD-10-CM | POA: Diagnosis not present

## 2014-11-23 DIAGNOSIS — D509 Iron deficiency anemia, unspecified: Secondary | ICD-10-CM | POA: Diagnosis not present

## 2014-11-25 DIAGNOSIS — K760 Fatty (change of) liver, not elsewhere classified: Secondary | ICD-10-CM | POA: Diagnosis not present

## 2014-11-25 DIAGNOSIS — N2581 Secondary hyperparathyroidism of renal origin: Secondary | ICD-10-CM | POA: Diagnosis not present

## 2014-11-25 DIAGNOSIS — N186 End stage renal disease: Secondary | ICD-10-CM | POA: Diagnosis not present

## 2014-11-25 DIAGNOSIS — D631 Anemia in chronic kidney disease: Secondary | ICD-10-CM | POA: Diagnosis not present

## 2014-11-25 DIAGNOSIS — D509 Iron deficiency anemia, unspecified: Secondary | ICD-10-CM | POA: Diagnosis not present

## 2014-11-28 DIAGNOSIS — I2699 Other pulmonary embolism without acute cor pulmonale: Secondary | ICD-10-CM | POA: Diagnosis not present

## 2014-11-28 DIAGNOSIS — N2581 Secondary hyperparathyroidism of renal origin: Secondary | ICD-10-CM | POA: Diagnosis not present

## 2014-11-28 DIAGNOSIS — D631 Anemia in chronic kidney disease: Secondary | ICD-10-CM | POA: Diagnosis not present

## 2014-11-28 DIAGNOSIS — N186 End stage renal disease: Secondary | ICD-10-CM | POA: Diagnosis not present

## 2014-11-28 DIAGNOSIS — D509 Iron deficiency anemia, unspecified: Secondary | ICD-10-CM | POA: Diagnosis not present

## 2014-11-28 DIAGNOSIS — K760 Fatty (change of) liver, not elsewhere classified: Secondary | ICD-10-CM | POA: Diagnosis not present

## 2014-11-30 DIAGNOSIS — N2581 Secondary hyperparathyroidism of renal origin: Secondary | ICD-10-CM | POA: Diagnosis not present

## 2014-11-30 DIAGNOSIS — D509 Iron deficiency anemia, unspecified: Secondary | ICD-10-CM | POA: Diagnosis not present

## 2014-11-30 DIAGNOSIS — D631 Anemia in chronic kidney disease: Secondary | ICD-10-CM | POA: Diagnosis not present

## 2014-11-30 DIAGNOSIS — K760 Fatty (change of) liver, not elsewhere classified: Secondary | ICD-10-CM | POA: Diagnosis not present

## 2014-11-30 DIAGNOSIS — N186 End stage renal disease: Secondary | ICD-10-CM | POA: Diagnosis not present

## 2014-12-03 DIAGNOSIS — D509 Iron deficiency anemia, unspecified: Secondary | ICD-10-CM | POA: Diagnosis not present

## 2014-12-03 DIAGNOSIS — D631 Anemia in chronic kidney disease: Secondary | ICD-10-CM | POA: Diagnosis not present

## 2014-12-03 DIAGNOSIS — K760 Fatty (change of) liver, not elsewhere classified: Secondary | ICD-10-CM | POA: Diagnosis not present

## 2014-12-03 DIAGNOSIS — N186 End stage renal disease: Secondary | ICD-10-CM | POA: Diagnosis not present

## 2014-12-03 DIAGNOSIS — N2581 Secondary hyperparathyroidism of renal origin: Secondary | ICD-10-CM | POA: Diagnosis not present

## 2014-12-04 DIAGNOSIS — Q612 Polycystic kidney, adult type: Secondary | ICD-10-CM | POA: Diagnosis not present

## 2014-12-04 DIAGNOSIS — N186 End stage renal disease: Secondary | ICD-10-CM | POA: Diagnosis not present

## 2014-12-04 DIAGNOSIS — Z992 Dependence on renal dialysis: Secondary | ICD-10-CM | POA: Diagnosis not present

## 2014-12-05 DIAGNOSIS — K760 Fatty (change of) liver, not elsewhere classified: Secondary | ICD-10-CM | POA: Diagnosis not present

## 2014-12-05 DIAGNOSIS — I2699 Other pulmonary embolism without acute cor pulmonale: Secondary | ICD-10-CM | POA: Diagnosis not present

## 2014-12-05 DIAGNOSIS — N2581 Secondary hyperparathyroidism of renal origin: Secondary | ICD-10-CM | POA: Diagnosis not present

## 2014-12-05 DIAGNOSIS — D509 Iron deficiency anemia, unspecified: Secondary | ICD-10-CM | POA: Diagnosis not present

## 2014-12-05 DIAGNOSIS — N186 End stage renal disease: Secondary | ICD-10-CM | POA: Diagnosis not present

## 2014-12-05 DIAGNOSIS — D631 Anemia in chronic kidney disease: Secondary | ICD-10-CM | POA: Diagnosis not present

## 2014-12-12 DIAGNOSIS — I2699 Other pulmonary embolism without acute cor pulmonale: Secondary | ICD-10-CM | POA: Diagnosis not present

## 2014-12-14 DIAGNOSIS — I2699 Other pulmonary embolism without acute cor pulmonale: Secondary | ICD-10-CM | POA: Diagnosis not present

## 2014-12-19 DIAGNOSIS — I2699 Other pulmonary embolism without acute cor pulmonale: Secondary | ICD-10-CM | POA: Diagnosis not present

## 2014-12-28 DIAGNOSIS — I2699 Other pulmonary embolism without acute cor pulmonale: Secondary | ICD-10-CM | POA: Diagnosis not present

## 2015-01-04 DIAGNOSIS — Q612 Polycystic kidney, adult type: Secondary | ICD-10-CM | POA: Diagnosis not present

## 2015-01-04 DIAGNOSIS — N186 End stage renal disease: Secondary | ICD-10-CM | POA: Diagnosis not present

## 2015-01-04 DIAGNOSIS — Z992 Dependence on renal dialysis: Secondary | ICD-10-CM | POA: Diagnosis not present

## 2015-01-06 DIAGNOSIS — I2699 Other pulmonary embolism without acute cor pulmonale: Secondary | ICD-10-CM | POA: Diagnosis not present

## 2015-01-06 DIAGNOSIS — K760 Fatty (change of) liver, not elsewhere classified: Secondary | ICD-10-CM | POA: Diagnosis not present

## 2015-01-06 DIAGNOSIS — N186 End stage renal disease: Secondary | ICD-10-CM | POA: Diagnosis not present

## 2015-01-06 DIAGNOSIS — N2581 Secondary hyperparathyroidism of renal origin: Secondary | ICD-10-CM | POA: Diagnosis not present

## 2015-01-06 DIAGNOSIS — D509 Iron deficiency anemia, unspecified: Secondary | ICD-10-CM | POA: Diagnosis not present

## 2015-01-06 DIAGNOSIS — D631 Anemia in chronic kidney disease: Secondary | ICD-10-CM | POA: Diagnosis not present

## 2015-01-09 DIAGNOSIS — D509 Iron deficiency anemia, unspecified: Secondary | ICD-10-CM | POA: Diagnosis not present

## 2015-01-09 DIAGNOSIS — N186 End stage renal disease: Secondary | ICD-10-CM | POA: Diagnosis not present

## 2015-01-09 DIAGNOSIS — N2581 Secondary hyperparathyroidism of renal origin: Secondary | ICD-10-CM | POA: Diagnosis not present

## 2015-01-09 DIAGNOSIS — K760 Fatty (change of) liver, not elsewhere classified: Secondary | ICD-10-CM | POA: Diagnosis not present

## 2015-01-09 DIAGNOSIS — I2699 Other pulmonary embolism without acute cor pulmonale: Secondary | ICD-10-CM | POA: Diagnosis not present

## 2015-01-09 DIAGNOSIS — D631 Anemia in chronic kidney disease: Secondary | ICD-10-CM | POA: Diagnosis not present

## 2015-01-11 DIAGNOSIS — N186 End stage renal disease: Secondary | ICD-10-CM | POA: Diagnosis not present

## 2015-01-11 DIAGNOSIS — K760 Fatty (change of) liver, not elsewhere classified: Secondary | ICD-10-CM | POA: Diagnosis not present

## 2015-01-11 DIAGNOSIS — N2581 Secondary hyperparathyroidism of renal origin: Secondary | ICD-10-CM | POA: Diagnosis not present

## 2015-01-11 DIAGNOSIS — D509 Iron deficiency anemia, unspecified: Secondary | ICD-10-CM | POA: Diagnosis not present

## 2015-01-11 DIAGNOSIS — D631 Anemia in chronic kidney disease: Secondary | ICD-10-CM | POA: Diagnosis not present

## 2015-01-13 DIAGNOSIS — D509 Iron deficiency anemia, unspecified: Secondary | ICD-10-CM | POA: Diagnosis not present

## 2015-01-13 DIAGNOSIS — D631 Anemia in chronic kidney disease: Secondary | ICD-10-CM | POA: Diagnosis not present

## 2015-01-13 DIAGNOSIS — K760 Fatty (change of) liver, not elsewhere classified: Secondary | ICD-10-CM | POA: Diagnosis not present

## 2015-01-13 DIAGNOSIS — N2581 Secondary hyperparathyroidism of renal origin: Secondary | ICD-10-CM | POA: Diagnosis not present

## 2015-01-13 DIAGNOSIS — N186 End stage renal disease: Secondary | ICD-10-CM | POA: Diagnosis not present

## 2015-01-16 DIAGNOSIS — N186 End stage renal disease: Secondary | ICD-10-CM | POA: Diagnosis not present

## 2015-01-16 DIAGNOSIS — K760 Fatty (change of) liver, not elsewhere classified: Secondary | ICD-10-CM | POA: Diagnosis not present

## 2015-01-16 DIAGNOSIS — I2699 Other pulmonary embolism without acute cor pulmonale: Secondary | ICD-10-CM | POA: Diagnosis not present

## 2015-01-16 DIAGNOSIS — N2581 Secondary hyperparathyroidism of renal origin: Secondary | ICD-10-CM | POA: Diagnosis not present

## 2015-01-16 DIAGNOSIS — D509 Iron deficiency anemia, unspecified: Secondary | ICD-10-CM | POA: Diagnosis not present

## 2015-01-16 DIAGNOSIS — D631 Anemia in chronic kidney disease: Secondary | ICD-10-CM | POA: Diagnosis not present

## 2015-01-18 DIAGNOSIS — N2581 Secondary hyperparathyroidism of renal origin: Secondary | ICD-10-CM | POA: Diagnosis not present

## 2015-01-18 DIAGNOSIS — N186 End stage renal disease: Secondary | ICD-10-CM | POA: Diagnosis not present

## 2015-01-18 DIAGNOSIS — D509 Iron deficiency anemia, unspecified: Secondary | ICD-10-CM | POA: Diagnosis not present

## 2015-01-18 DIAGNOSIS — I2699 Other pulmonary embolism without acute cor pulmonale: Secondary | ICD-10-CM | POA: Diagnosis not present

## 2015-01-18 DIAGNOSIS — D631 Anemia in chronic kidney disease: Secondary | ICD-10-CM | POA: Diagnosis not present

## 2015-01-18 DIAGNOSIS — K760 Fatty (change of) liver, not elsewhere classified: Secondary | ICD-10-CM | POA: Diagnosis not present

## 2015-01-20 DIAGNOSIS — K760 Fatty (change of) liver, not elsewhere classified: Secondary | ICD-10-CM | POA: Diagnosis not present

## 2015-01-20 DIAGNOSIS — D509 Iron deficiency anemia, unspecified: Secondary | ICD-10-CM | POA: Diagnosis not present

## 2015-01-20 DIAGNOSIS — D631 Anemia in chronic kidney disease: Secondary | ICD-10-CM | POA: Diagnosis not present

## 2015-01-20 DIAGNOSIS — N186 End stage renal disease: Secondary | ICD-10-CM | POA: Diagnosis not present

## 2015-01-20 DIAGNOSIS — N2581 Secondary hyperparathyroidism of renal origin: Secondary | ICD-10-CM | POA: Diagnosis not present

## 2015-01-24 DIAGNOSIS — N186 End stage renal disease: Secondary | ICD-10-CM | POA: Diagnosis not present

## 2015-01-24 DIAGNOSIS — D509 Iron deficiency anemia, unspecified: Secondary | ICD-10-CM | POA: Diagnosis not present

## 2015-01-24 DIAGNOSIS — I2699 Other pulmonary embolism without acute cor pulmonale: Secondary | ICD-10-CM | POA: Diagnosis not present

## 2015-01-24 DIAGNOSIS — D631 Anemia in chronic kidney disease: Secondary | ICD-10-CM | POA: Diagnosis not present

## 2015-01-24 DIAGNOSIS — K760 Fatty (change of) liver, not elsewhere classified: Secondary | ICD-10-CM | POA: Diagnosis not present

## 2015-01-24 DIAGNOSIS — N2581 Secondary hyperparathyroidism of renal origin: Secondary | ICD-10-CM | POA: Diagnosis not present

## 2015-01-25 DIAGNOSIS — D509 Iron deficiency anemia, unspecified: Secondary | ICD-10-CM | POA: Diagnosis not present

## 2015-01-25 DIAGNOSIS — D631 Anemia in chronic kidney disease: Secondary | ICD-10-CM | POA: Diagnosis not present

## 2015-01-25 DIAGNOSIS — N2581 Secondary hyperparathyroidism of renal origin: Secondary | ICD-10-CM | POA: Diagnosis not present

## 2015-01-25 DIAGNOSIS — N186 End stage renal disease: Secondary | ICD-10-CM | POA: Diagnosis not present

## 2015-01-25 DIAGNOSIS — K760 Fatty (change of) liver, not elsewhere classified: Secondary | ICD-10-CM | POA: Diagnosis not present

## 2015-01-27 DIAGNOSIS — H938X2 Other specified disorders of left ear: Secondary | ICD-10-CM | POA: Diagnosis not present

## 2015-01-27 DIAGNOSIS — H9122 Sudden idiopathic hearing loss, left ear: Secondary | ICD-10-CM | POA: Diagnosis not present

## 2015-01-27 DIAGNOSIS — K760 Fatty (change of) liver, not elsewhere classified: Secondary | ICD-10-CM | POA: Diagnosis not present

## 2015-01-27 DIAGNOSIS — N2581 Secondary hyperparathyroidism of renal origin: Secondary | ICD-10-CM | POA: Diagnosis not present

## 2015-01-27 DIAGNOSIS — N186 End stage renal disease: Secondary | ICD-10-CM | POA: Diagnosis not present

## 2015-01-27 DIAGNOSIS — D631 Anemia in chronic kidney disease: Secondary | ICD-10-CM | POA: Diagnosis not present

## 2015-01-27 DIAGNOSIS — H9312 Tinnitus, left ear: Secondary | ICD-10-CM | POA: Diagnosis not present

## 2015-01-27 DIAGNOSIS — D509 Iron deficiency anemia, unspecified: Secondary | ICD-10-CM | POA: Diagnosis not present

## 2015-01-30 DIAGNOSIS — D631 Anemia in chronic kidney disease: Secondary | ICD-10-CM | POA: Diagnosis not present

## 2015-01-30 DIAGNOSIS — K760 Fatty (change of) liver, not elsewhere classified: Secondary | ICD-10-CM | POA: Diagnosis not present

## 2015-01-30 DIAGNOSIS — N2581 Secondary hyperparathyroidism of renal origin: Secondary | ICD-10-CM | POA: Diagnosis not present

## 2015-01-30 DIAGNOSIS — I2699 Other pulmonary embolism without acute cor pulmonale: Secondary | ICD-10-CM | POA: Diagnosis not present

## 2015-01-30 DIAGNOSIS — N186 End stage renal disease: Secondary | ICD-10-CM | POA: Diagnosis not present

## 2015-01-30 DIAGNOSIS — D509 Iron deficiency anemia, unspecified: Secondary | ICD-10-CM | POA: Diagnosis not present

## 2015-02-01 DIAGNOSIS — N186 End stage renal disease: Secondary | ICD-10-CM | POA: Diagnosis not present

## 2015-02-01 DIAGNOSIS — N2581 Secondary hyperparathyroidism of renal origin: Secondary | ICD-10-CM | POA: Diagnosis not present

## 2015-02-01 DIAGNOSIS — D631 Anemia in chronic kidney disease: Secondary | ICD-10-CM | POA: Diagnosis not present

## 2015-02-01 DIAGNOSIS — D509 Iron deficiency anemia, unspecified: Secondary | ICD-10-CM | POA: Diagnosis not present

## 2015-02-01 DIAGNOSIS — K760 Fatty (change of) liver, not elsewhere classified: Secondary | ICD-10-CM | POA: Diagnosis not present

## 2015-02-03 DIAGNOSIS — Q612 Polycystic kidney, adult type: Secondary | ICD-10-CM | POA: Diagnosis not present

## 2015-02-03 DIAGNOSIS — N186 End stage renal disease: Secondary | ICD-10-CM | POA: Diagnosis not present

## 2015-02-03 DIAGNOSIS — Z992 Dependence on renal dialysis: Secondary | ICD-10-CM | POA: Diagnosis not present

## 2015-02-04 DIAGNOSIS — Z23 Encounter for immunization: Secondary | ICD-10-CM | POA: Diagnosis not present

## 2015-02-04 DIAGNOSIS — N186 End stage renal disease: Secondary | ICD-10-CM | POA: Diagnosis not present

## 2015-02-04 DIAGNOSIS — N2581 Secondary hyperparathyroidism of renal origin: Secondary | ICD-10-CM | POA: Diagnosis not present

## 2015-02-04 DIAGNOSIS — K760 Fatty (change of) liver, not elsewhere classified: Secondary | ICD-10-CM | POA: Diagnosis not present

## 2015-02-04 DIAGNOSIS — D631 Anemia in chronic kidney disease: Secondary | ICD-10-CM | POA: Diagnosis not present

## 2015-02-04 DIAGNOSIS — D509 Iron deficiency anemia, unspecified: Secondary | ICD-10-CM | POA: Diagnosis not present

## 2015-02-06 DIAGNOSIS — D6859 Other primary thrombophilia: Secondary | ICD-10-CM | POA: Diagnosis not present

## 2015-02-13 DIAGNOSIS — I2699 Other pulmonary embolism without acute cor pulmonale: Secondary | ICD-10-CM | POA: Diagnosis not present

## 2015-02-17 DIAGNOSIS — I2699 Other pulmonary embolism without acute cor pulmonale: Secondary | ICD-10-CM | POA: Diagnosis not present

## 2015-02-20 DIAGNOSIS — I2699 Other pulmonary embolism without acute cor pulmonale: Secondary | ICD-10-CM | POA: Diagnosis not present

## 2015-03-03 DIAGNOSIS — I2699 Other pulmonary embolism without acute cor pulmonale: Secondary | ICD-10-CM | POA: Diagnosis not present

## 2015-03-06 DIAGNOSIS — Q612 Polycystic kidney, adult type: Secondary | ICD-10-CM | POA: Diagnosis not present

## 2015-03-06 DIAGNOSIS — N186 End stage renal disease: Secondary | ICD-10-CM | POA: Diagnosis not present

## 2015-03-06 DIAGNOSIS — Z992 Dependence on renal dialysis: Secondary | ICD-10-CM | POA: Diagnosis not present

## 2015-03-08 DIAGNOSIS — N186 End stage renal disease: Secondary | ICD-10-CM | POA: Diagnosis not present

## 2015-03-08 DIAGNOSIS — D631 Anemia in chronic kidney disease: Secondary | ICD-10-CM | POA: Diagnosis not present

## 2015-03-08 DIAGNOSIS — N2581 Secondary hyperparathyroidism of renal origin: Secondary | ICD-10-CM | POA: Diagnosis not present

## 2015-03-08 DIAGNOSIS — D509 Iron deficiency anemia, unspecified: Secondary | ICD-10-CM | POA: Diagnosis not present

## 2015-03-08 DIAGNOSIS — K760 Fatty (change of) liver, not elsewhere classified: Secondary | ICD-10-CM | POA: Diagnosis not present

## 2015-03-08 DIAGNOSIS — I2699 Other pulmonary embolism without acute cor pulmonale: Secondary | ICD-10-CM | POA: Diagnosis not present

## 2015-03-15 DIAGNOSIS — I2699 Other pulmonary embolism without acute cor pulmonale: Secondary | ICD-10-CM | POA: Diagnosis not present

## 2015-03-22 DIAGNOSIS — I2699 Other pulmonary embolism without acute cor pulmonale: Secondary | ICD-10-CM | POA: Diagnosis not present

## 2015-03-28 DIAGNOSIS — I2699 Other pulmonary embolism without acute cor pulmonale: Secondary | ICD-10-CM | POA: Diagnosis not present

## 2015-03-31 DIAGNOSIS — N2581 Secondary hyperparathyroidism of renal origin: Secondary | ICD-10-CM | POA: Diagnosis not present

## 2015-03-31 DIAGNOSIS — N186 End stage renal disease: Secondary | ICD-10-CM | POA: Diagnosis not present

## 2015-04-05 ENCOUNTER — Emergency Department (HOSPITAL_COMMUNITY)
Admission: EM | Admit: 2015-04-05 | Discharge: 2015-04-05 | Discharge status: Absent without leave | Payer: Medicare Other | Source: Home / Self Care

## 2015-04-05 DIAGNOSIS — Q612 Polycystic kidney, adult type: Secondary | ICD-10-CM | POA: Diagnosis not present

## 2015-04-05 DIAGNOSIS — N186 End stage renal disease: Secondary | ICD-10-CM | POA: Diagnosis not present

## 2015-04-05 DIAGNOSIS — Z992 Dependence on renal dialysis: Secondary | ICD-10-CM | POA: Diagnosis not present

## 2015-04-05 DIAGNOSIS — I2699 Other pulmonary embolism without acute cor pulmonale: Secondary | ICD-10-CM | POA: Diagnosis not present

## 2015-04-06 DIAGNOSIS — M25552 Pain in left hip: Secondary | ICD-10-CM | POA: Diagnosis not present

## 2015-04-07 DIAGNOSIS — N186 End stage renal disease: Secondary | ICD-10-CM | POA: Diagnosis not present

## 2015-04-07 DIAGNOSIS — D509 Iron deficiency anemia, unspecified: Secondary | ICD-10-CM | POA: Diagnosis not present

## 2015-04-07 DIAGNOSIS — D631 Anemia in chronic kidney disease: Secondary | ICD-10-CM | POA: Diagnosis not present

## 2015-04-07 DIAGNOSIS — N2581 Secondary hyperparathyroidism of renal origin: Secondary | ICD-10-CM | POA: Diagnosis not present

## 2015-04-07 DIAGNOSIS — K760 Fatty (change of) liver, not elsewhere classified: Secondary | ICD-10-CM | POA: Diagnosis not present

## 2015-04-12 DIAGNOSIS — I2699 Other pulmonary embolism without acute cor pulmonale: Secondary | ICD-10-CM | POA: Diagnosis not present

## 2015-04-21 DIAGNOSIS — I2699 Other pulmonary embolism without acute cor pulmonale: Secondary | ICD-10-CM | POA: Diagnosis not present

## 2015-05-03 DIAGNOSIS — I2699 Other pulmonary embolism without acute cor pulmonale: Secondary | ICD-10-CM | POA: Diagnosis not present

## 2015-05-06 DIAGNOSIS — Z992 Dependence on renal dialysis: Secondary | ICD-10-CM | POA: Diagnosis not present

## 2015-05-06 DIAGNOSIS — N186 End stage renal disease: Secondary | ICD-10-CM | POA: Diagnosis not present

## 2015-05-06 DIAGNOSIS — Q612 Polycystic kidney, adult type: Secondary | ICD-10-CM | POA: Diagnosis not present

## 2015-05-08 DIAGNOSIS — D631 Anemia in chronic kidney disease: Secondary | ICD-10-CM | POA: Diagnosis not present

## 2015-05-08 DIAGNOSIS — K760 Fatty (change of) liver, not elsewhere classified: Secondary | ICD-10-CM | POA: Diagnosis not present

## 2015-05-08 DIAGNOSIS — N2581 Secondary hyperparathyroidism of renal origin: Secondary | ICD-10-CM | POA: Diagnosis not present

## 2015-05-08 DIAGNOSIS — R7309 Other abnormal glucose: Secondary | ICD-10-CM | POA: Diagnosis not present

## 2015-05-08 DIAGNOSIS — N186 End stage renal disease: Secondary | ICD-10-CM | POA: Diagnosis not present

## 2015-05-08 DIAGNOSIS — D509 Iron deficiency anemia, unspecified: Secondary | ICD-10-CM | POA: Diagnosis not present

## 2015-05-10 DIAGNOSIS — I2699 Other pulmonary embolism without acute cor pulmonale: Secondary | ICD-10-CM | POA: Diagnosis not present

## 2015-05-17 DIAGNOSIS — I2699 Other pulmonary embolism without acute cor pulmonale: Secondary | ICD-10-CM | POA: Diagnosis not present

## 2015-05-24 DIAGNOSIS — I2699 Other pulmonary embolism without acute cor pulmonale: Secondary | ICD-10-CM | POA: Diagnosis not present

## 2015-05-31 DIAGNOSIS — I2699 Other pulmonary embolism without acute cor pulmonale: Secondary | ICD-10-CM | POA: Diagnosis not present

## 2015-06-06 DIAGNOSIS — Z992 Dependence on renal dialysis: Secondary | ICD-10-CM | POA: Diagnosis not present

## 2015-06-06 DIAGNOSIS — N186 End stage renal disease: Secondary | ICD-10-CM | POA: Diagnosis not present

## 2015-06-06 DIAGNOSIS — Q612 Polycystic kidney, adult type: Secondary | ICD-10-CM | POA: Diagnosis not present

## 2015-06-07 DIAGNOSIS — N2581 Secondary hyperparathyroidism of renal origin: Secondary | ICD-10-CM | POA: Diagnosis not present

## 2015-06-07 DIAGNOSIS — D509 Iron deficiency anemia, unspecified: Secondary | ICD-10-CM | POA: Diagnosis not present

## 2015-06-07 DIAGNOSIS — N186 End stage renal disease: Secondary | ICD-10-CM | POA: Diagnosis not present

## 2015-06-07 DIAGNOSIS — K760 Fatty (change of) liver, not elsewhere classified: Secondary | ICD-10-CM | POA: Diagnosis not present

## 2015-06-07 DIAGNOSIS — D631 Anemia in chronic kidney disease: Secondary | ICD-10-CM | POA: Diagnosis not present

## 2015-06-07 DIAGNOSIS — I2699 Other pulmonary embolism without acute cor pulmonale: Secondary | ICD-10-CM | POA: Diagnosis not present

## 2015-06-12 DIAGNOSIS — D6859 Other primary thrombophilia: Secondary | ICD-10-CM | POA: Diagnosis not present

## 2015-06-14 DIAGNOSIS — I2699 Other pulmonary embolism without acute cor pulmonale: Secondary | ICD-10-CM | POA: Diagnosis not present

## 2015-06-23 DIAGNOSIS — I2699 Other pulmonary embolism without acute cor pulmonale: Secondary | ICD-10-CM | POA: Diagnosis not present

## 2015-06-28 DIAGNOSIS — I2699 Other pulmonary embolism without acute cor pulmonale: Secondary | ICD-10-CM | POA: Diagnosis not present

## 2015-07-04 DIAGNOSIS — N186 End stage renal disease: Secondary | ICD-10-CM | POA: Diagnosis not present

## 2015-07-04 DIAGNOSIS — Q612 Polycystic kidney, adult type: Secondary | ICD-10-CM | POA: Diagnosis not present

## 2015-07-04 DIAGNOSIS — Z992 Dependence on renal dialysis: Secondary | ICD-10-CM | POA: Diagnosis not present

## 2015-07-05 DIAGNOSIS — N2581 Secondary hyperparathyroidism of renal origin: Secondary | ICD-10-CM | POA: Diagnosis not present

## 2015-07-05 DIAGNOSIS — K760 Fatty (change of) liver, not elsewhere classified: Secondary | ICD-10-CM | POA: Diagnosis not present

## 2015-07-05 DIAGNOSIS — D631 Anemia in chronic kidney disease: Secondary | ICD-10-CM | POA: Diagnosis not present

## 2015-07-05 DIAGNOSIS — N186 End stage renal disease: Secondary | ICD-10-CM | POA: Diagnosis not present

## 2015-07-05 DIAGNOSIS — D509 Iron deficiency anemia, unspecified: Secondary | ICD-10-CM | POA: Diagnosis not present

## 2015-07-05 DIAGNOSIS — I2699 Other pulmonary embolism without acute cor pulmonale: Secondary | ICD-10-CM | POA: Diagnosis not present

## 2015-07-10 DIAGNOSIS — H40033 Anatomical narrow angle, bilateral: Secondary | ICD-10-CM | POA: Diagnosis not present

## 2015-07-10 DIAGNOSIS — H1013 Acute atopic conjunctivitis, bilateral: Secondary | ICD-10-CM | POA: Diagnosis not present

## 2015-07-12 DIAGNOSIS — I2699 Other pulmonary embolism without acute cor pulmonale: Secondary | ICD-10-CM | POA: Diagnosis not present

## 2015-07-19 DIAGNOSIS — I2699 Other pulmonary embolism without acute cor pulmonale: Secondary | ICD-10-CM | POA: Diagnosis not present

## 2015-07-26 DIAGNOSIS — I2699 Other pulmonary embolism without acute cor pulmonale: Secondary | ICD-10-CM | POA: Diagnosis not present

## 2015-08-04 DIAGNOSIS — I2699 Other pulmonary embolism without acute cor pulmonale: Secondary | ICD-10-CM | POA: Diagnosis not present

## 2015-08-04 DIAGNOSIS — N186 End stage renal disease: Secondary | ICD-10-CM | POA: Diagnosis not present

## 2015-08-04 DIAGNOSIS — Z992 Dependence on renal dialysis: Secondary | ICD-10-CM | POA: Diagnosis not present

## 2015-08-04 DIAGNOSIS — Q612 Polycystic kidney, adult type: Secondary | ICD-10-CM | POA: Diagnosis not present

## 2015-08-07 DIAGNOSIS — N2581 Secondary hyperparathyroidism of renal origin: Secondary | ICD-10-CM | POA: Diagnosis not present

## 2015-08-07 DIAGNOSIS — K760 Fatty (change of) liver, not elsewhere classified: Secondary | ICD-10-CM | POA: Diagnosis not present

## 2015-08-07 DIAGNOSIS — D631 Anemia in chronic kidney disease: Secondary | ICD-10-CM | POA: Diagnosis not present

## 2015-08-07 DIAGNOSIS — N186 End stage renal disease: Secondary | ICD-10-CM | POA: Diagnosis not present

## 2015-08-07 DIAGNOSIS — D509 Iron deficiency anemia, unspecified: Secondary | ICD-10-CM | POA: Diagnosis not present

## 2015-08-09 DIAGNOSIS — I2699 Other pulmonary embolism without acute cor pulmonale: Secondary | ICD-10-CM | POA: Diagnosis not present

## 2015-08-16 DIAGNOSIS — I2699 Other pulmonary embolism without acute cor pulmonale: Secondary | ICD-10-CM | POA: Diagnosis not present

## 2015-08-25 DIAGNOSIS — I2699 Other pulmonary embolism without acute cor pulmonale: Secondary | ICD-10-CM | POA: Diagnosis not present

## 2015-08-30 DIAGNOSIS — I2699 Other pulmonary embolism without acute cor pulmonale: Secondary | ICD-10-CM | POA: Diagnosis not present

## 2015-09-03 DIAGNOSIS — N186 End stage renal disease: Secondary | ICD-10-CM | POA: Diagnosis not present

## 2015-09-03 DIAGNOSIS — Z992 Dependence on renal dialysis: Secondary | ICD-10-CM | POA: Diagnosis not present

## 2015-09-03 DIAGNOSIS — Q612 Polycystic kidney, adult type: Secondary | ICD-10-CM | POA: Diagnosis not present

## 2015-09-05 DIAGNOSIS — K760 Fatty (change of) liver, not elsewhere classified: Secondary | ICD-10-CM | POA: Diagnosis not present

## 2015-09-05 DIAGNOSIS — D509 Iron deficiency anemia, unspecified: Secondary | ICD-10-CM | POA: Diagnosis not present

## 2015-09-05 DIAGNOSIS — D631 Anemia in chronic kidney disease: Secondary | ICD-10-CM | POA: Diagnosis not present

## 2015-09-05 DIAGNOSIS — N2581 Secondary hyperparathyroidism of renal origin: Secondary | ICD-10-CM | POA: Diagnosis not present

## 2015-09-05 DIAGNOSIS — N186 End stage renal disease: Secondary | ICD-10-CM | POA: Diagnosis not present

## 2015-09-08 DIAGNOSIS — K760 Fatty (change of) liver, not elsewhere classified: Secondary | ICD-10-CM | POA: Diagnosis not present

## 2015-09-08 DIAGNOSIS — N186 End stage renal disease: Secondary | ICD-10-CM | POA: Diagnosis not present

## 2015-09-08 DIAGNOSIS — D509 Iron deficiency anemia, unspecified: Secondary | ICD-10-CM | POA: Diagnosis not present

## 2015-09-08 DIAGNOSIS — D631 Anemia in chronic kidney disease: Secondary | ICD-10-CM | POA: Diagnosis not present

## 2015-09-08 DIAGNOSIS — N2581 Secondary hyperparathyroidism of renal origin: Secondary | ICD-10-CM | POA: Diagnosis not present

## 2015-09-11 DIAGNOSIS — K760 Fatty (change of) liver, not elsewhere classified: Secondary | ICD-10-CM | POA: Diagnosis not present

## 2015-09-11 DIAGNOSIS — N2581 Secondary hyperparathyroidism of renal origin: Secondary | ICD-10-CM | POA: Diagnosis not present

## 2015-09-11 DIAGNOSIS — N186 End stage renal disease: Secondary | ICD-10-CM | POA: Diagnosis not present

## 2015-09-11 DIAGNOSIS — D509 Iron deficiency anemia, unspecified: Secondary | ICD-10-CM | POA: Diagnosis not present

## 2015-09-11 DIAGNOSIS — D631 Anemia in chronic kidney disease: Secondary | ICD-10-CM | POA: Diagnosis not present

## 2015-09-13 DIAGNOSIS — D509 Iron deficiency anemia, unspecified: Secondary | ICD-10-CM | POA: Diagnosis not present

## 2015-09-13 DIAGNOSIS — D631 Anemia in chronic kidney disease: Secondary | ICD-10-CM | POA: Diagnosis not present

## 2015-09-13 DIAGNOSIS — I2699 Other pulmonary embolism without acute cor pulmonale: Secondary | ICD-10-CM | POA: Diagnosis not present

## 2015-09-13 DIAGNOSIS — N186 End stage renal disease: Secondary | ICD-10-CM | POA: Diagnosis not present

## 2015-09-13 DIAGNOSIS — N2581 Secondary hyperparathyroidism of renal origin: Secondary | ICD-10-CM | POA: Diagnosis not present

## 2015-09-13 DIAGNOSIS — K760 Fatty (change of) liver, not elsewhere classified: Secondary | ICD-10-CM | POA: Diagnosis not present

## 2015-09-14 DIAGNOSIS — N2581 Secondary hyperparathyroidism of renal origin: Secondary | ICD-10-CM | POA: Diagnosis not present

## 2015-09-14 DIAGNOSIS — D509 Iron deficiency anemia, unspecified: Secondary | ICD-10-CM | POA: Diagnosis not present

## 2015-09-14 DIAGNOSIS — D631 Anemia in chronic kidney disease: Secondary | ICD-10-CM | POA: Diagnosis not present

## 2015-09-14 DIAGNOSIS — K760 Fatty (change of) liver, not elsewhere classified: Secondary | ICD-10-CM | POA: Diagnosis not present

## 2015-09-14 DIAGNOSIS — N186 End stage renal disease: Secondary | ICD-10-CM | POA: Diagnosis not present

## 2015-09-19 DIAGNOSIS — D631 Anemia in chronic kidney disease: Secondary | ICD-10-CM | POA: Diagnosis not present

## 2015-09-19 DIAGNOSIS — N2581 Secondary hyperparathyroidism of renal origin: Secondary | ICD-10-CM | POA: Diagnosis not present

## 2015-09-19 DIAGNOSIS — D509 Iron deficiency anemia, unspecified: Secondary | ICD-10-CM | POA: Diagnosis not present

## 2015-09-19 DIAGNOSIS — N186 End stage renal disease: Secondary | ICD-10-CM | POA: Diagnosis not present

## 2015-09-19 DIAGNOSIS — K760 Fatty (change of) liver, not elsewhere classified: Secondary | ICD-10-CM | POA: Diagnosis not present

## 2015-09-22 DIAGNOSIS — K760 Fatty (change of) liver, not elsewhere classified: Secondary | ICD-10-CM | POA: Diagnosis not present

## 2015-09-22 DIAGNOSIS — D631 Anemia in chronic kidney disease: Secondary | ICD-10-CM | POA: Diagnosis not present

## 2015-09-22 DIAGNOSIS — D509 Iron deficiency anemia, unspecified: Secondary | ICD-10-CM | POA: Diagnosis not present

## 2015-09-22 DIAGNOSIS — N2581 Secondary hyperparathyroidism of renal origin: Secondary | ICD-10-CM | POA: Diagnosis not present

## 2015-09-22 DIAGNOSIS — I2699 Other pulmonary embolism without acute cor pulmonale: Secondary | ICD-10-CM | POA: Diagnosis not present

## 2015-09-22 DIAGNOSIS — N186 End stage renal disease: Secondary | ICD-10-CM | POA: Diagnosis not present

## 2015-09-25 DIAGNOSIS — N2581 Secondary hyperparathyroidism of renal origin: Secondary | ICD-10-CM | POA: Diagnosis not present

## 2015-09-25 DIAGNOSIS — D509 Iron deficiency anemia, unspecified: Secondary | ICD-10-CM | POA: Diagnosis not present

## 2015-09-25 DIAGNOSIS — D631 Anemia in chronic kidney disease: Secondary | ICD-10-CM | POA: Diagnosis not present

## 2015-09-25 DIAGNOSIS — N186 End stage renal disease: Secondary | ICD-10-CM | POA: Diagnosis not present

## 2015-09-25 DIAGNOSIS — K760 Fatty (change of) liver, not elsewhere classified: Secondary | ICD-10-CM | POA: Diagnosis not present

## 2015-09-27 DIAGNOSIS — N2581 Secondary hyperparathyroidism of renal origin: Secondary | ICD-10-CM | POA: Diagnosis not present

## 2015-09-27 DIAGNOSIS — I2699 Other pulmonary embolism without acute cor pulmonale: Secondary | ICD-10-CM | POA: Diagnosis not present

## 2015-09-27 DIAGNOSIS — K760 Fatty (change of) liver, not elsewhere classified: Secondary | ICD-10-CM | POA: Diagnosis not present

## 2015-09-27 DIAGNOSIS — N186 End stage renal disease: Secondary | ICD-10-CM | POA: Diagnosis not present

## 2015-09-27 DIAGNOSIS — D631 Anemia in chronic kidney disease: Secondary | ICD-10-CM | POA: Diagnosis not present

## 2015-09-27 DIAGNOSIS — D509 Iron deficiency anemia, unspecified: Secondary | ICD-10-CM | POA: Diagnosis not present

## 2015-09-29 DIAGNOSIS — K760 Fatty (change of) liver, not elsewhere classified: Secondary | ICD-10-CM | POA: Diagnosis not present

## 2015-09-29 DIAGNOSIS — D631 Anemia in chronic kidney disease: Secondary | ICD-10-CM | POA: Diagnosis not present

## 2015-09-29 DIAGNOSIS — N186 End stage renal disease: Secondary | ICD-10-CM | POA: Diagnosis not present

## 2015-09-29 DIAGNOSIS — D509 Iron deficiency anemia, unspecified: Secondary | ICD-10-CM | POA: Diagnosis not present

## 2015-09-29 DIAGNOSIS — N2581 Secondary hyperparathyroidism of renal origin: Secondary | ICD-10-CM | POA: Diagnosis not present

## 2015-10-02 DIAGNOSIS — N186 End stage renal disease: Secondary | ICD-10-CM | POA: Diagnosis not present

## 2015-10-02 DIAGNOSIS — D509 Iron deficiency anemia, unspecified: Secondary | ICD-10-CM | POA: Diagnosis not present

## 2015-10-02 DIAGNOSIS — N2581 Secondary hyperparathyroidism of renal origin: Secondary | ICD-10-CM | POA: Diagnosis not present

## 2015-10-02 DIAGNOSIS — K760 Fatty (change of) liver, not elsewhere classified: Secondary | ICD-10-CM | POA: Diagnosis not present

## 2015-10-02 DIAGNOSIS — D631 Anemia in chronic kidney disease: Secondary | ICD-10-CM | POA: Diagnosis not present

## 2015-10-03 ENCOUNTER — Encounter: Payer: Self-pay | Admitting: Surgery

## 2015-10-04 DIAGNOSIS — N2581 Secondary hyperparathyroidism of renal origin: Secondary | ICD-10-CM | POA: Diagnosis not present

## 2015-10-04 DIAGNOSIS — D631 Anemia in chronic kidney disease: Secondary | ICD-10-CM | POA: Diagnosis not present

## 2015-10-04 DIAGNOSIS — I2699 Other pulmonary embolism without acute cor pulmonale: Secondary | ICD-10-CM | POA: Diagnosis not present

## 2015-10-04 DIAGNOSIS — Z992 Dependence on renal dialysis: Secondary | ICD-10-CM | POA: Diagnosis not present

## 2015-10-04 DIAGNOSIS — K760 Fatty (change of) liver, not elsewhere classified: Secondary | ICD-10-CM | POA: Diagnosis not present

## 2015-10-04 DIAGNOSIS — Q612 Polycystic kidney, adult type: Secondary | ICD-10-CM | POA: Diagnosis not present

## 2015-10-04 DIAGNOSIS — N186 End stage renal disease: Secondary | ICD-10-CM | POA: Diagnosis not present

## 2015-10-04 DIAGNOSIS — D509 Iron deficiency anemia, unspecified: Secondary | ICD-10-CM | POA: Diagnosis not present

## 2015-10-06 DIAGNOSIS — D631 Anemia in chronic kidney disease: Secondary | ICD-10-CM | POA: Diagnosis not present

## 2015-10-06 DIAGNOSIS — N186 End stage renal disease: Secondary | ICD-10-CM | POA: Diagnosis not present

## 2015-10-06 DIAGNOSIS — D509 Iron deficiency anemia, unspecified: Secondary | ICD-10-CM | POA: Diagnosis not present

## 2015-10-06 DIAGNOSIS — K760 Fatty (change of) liver, not elsewhere classified: Secondary | ICD-10-CM | POA: Diagnosis not present

## 2015-10-09 ENCOUNTER — Ambulatory Visit: Payer: Medicare Other | Admitting: Surgery

## 2015-10-10 DIAGNOSIS — K760 Fatty (change of) liver, not elsewhere classified: Secondary | ICD-10-CM | POA: Diagnosis not present

## 2015-10-10 DIAGNOSIS — D509 Iron deficiency anemia, unspecified: Secondary | ICD-10-CM | POA: Diagnosis not present

## 2015-10-10 DIAGNOSIS — N186 End stage renal disease: Secondary | ICD-10-CM | POA: Diagnosis not present

## 2015-10-10 DIAGNOSIS — D631 Anemia in chronic kidney disease: Secondary | ICD-10-CM | POA: Diagnosis not present

## 2015-10-11 DIAGNOSIS — D631 Anemia in chronic kidney disease: Secondary | ICD-10-CM | POA: Diagnosis not present

## 2015-10-11 DIAGNOSIS — N186 End stage renal disease: Secondary | ICD-10-CM | POA: Diagnosis not present

## 2015-10-11 DIAGNOSIS — K760 Fatty (change of) liver, not elsewhere classified: Secondary | ICD-10-CM | POA: Diagnosis not present

## 2015-10-11 DIAGNOSIS — D509 Iron deficiency anemia, unspecified: Secondary | ICD-10-CM | POA: Diagnosis not present

## 2015-10-13 ENCOUNTER — Encounter: Payer: Self-pay | Admitting: Surgery

## 2015-10-13 DIAGNOSIS — K760 Fatty (change of) liver, not elsewhere classified: Secondary | ICD-10-CM | POA: Diagnosis not present

## 2015-10-13 DIAGNOSIS — D631 Anemia in chronic kidney disease: Secondary | ICD-10-CM | POA: Diagnosis not present

## 2015-10-13 DIAGNOSIS — D509 Iron deficiency anemia, unspecified: Secondary | ICD-10-CM | POA: Diagnosis not present

## 2015-10-13 DIAGNOSIS — N186 End stage renal disease: Secondary | ICD-10-CM | POA: Diagnosis not present

## 2015-10-16 ENCOUNTER — Ambulatory Visit (INDEPENDENT_AMBULATORY_CARE_PROVIDER_SITE_OTHER): Payer: Medicare Other | Admitting: Surgery

## 2015-10-16 ENCOUNTER — Encounter: Payer: Self-pay | Admitting: Surgery

## 2015-10-16 VITALS — BP 120/90 | HR 88 | Temp 97.7°F | Resp 18 | Ht 63.0 in | Wt 248.0 lb

## 2015-10-16 DIAGNOSIS — D509 Iron deficiency anemia, unspecified: Secondary | ICD-10-CM | POA: Diagnosis not present

## 2015-10-16 DIAGNOSIS — N186 End stage renal disease: Secondary | ICD-10-CM | POA: Diagnosis not present

## 2015-10-16 DIAGNOSIS — D631 Anemia in chronic kidney disease: Secondary | ICD-10-CM | POA: Diagnosis not present

## 2015-10-16 DIAGNOSIS — K760 Fatty (change of) liver, not elsewhere classified: Secondary | ICD-10-CM | POA: Diagnosis not present
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
## 2015-10-18 DIAGNOSIS — N186 End stage renal disease: Secondary | ICD-10-CM | POA: Diagnosis not present

## 2015-10-18 DIAGNOSIS — K760 Fatty (change of) liver, not elsewhere classified: Secondary | ICD-10-CM | POA: Diagnosis not present

## 2015-10-18 DIAGNOSIS — D509 Iron deficiency anemia, unspecified: Secondary | ICD-10-CM | POA: Diagnosis not present

## 2015-10-18 DIAGNOSIS — I2699 Other pulmonary embolism without acute cor pulmonale: Secondary | ICD-10-CM | POA: Diagnosis not present

## 2015-10-18 DIAGNOSIS — D631 Anemia in chronic kidney disease: Secondary | ICD-10-CM | POA: Diagnosis not present

## 2015-10-20 DIAGNOSIS — K760 Fatty (change of) liver, not elsewhere classified: Secondary | ICD-10-CM | POA: Diagnosis not present

## 2015-10-20 DIAGNOSIS — D509 Iron deficiency anemia, unspecified: Secondary | ICD-10-CM | POA: Diagnosis not present

## 2015-10-20 DIAGNOSIS — D631 Anemia in chronic kidney disease: Secondary | ICD-10-CM | POA: Diagnosis not present

## 2015-10-20 DIAGNOSIS — N186 End stage renal disease: Secondary | ICD-10-CM | POA: Diagnosis not present

## 2015-10-23 DIAGNOSIS — K760 Fatty (change of) liver, not elsewhere classified: Secondary | ICD-10-CM | POA: Diagnosis not present

## 2015-10-23 DIAGNOSIS — D631 Anemia in chronic kidney disease: Secondary | ICD-10-CM | POA: Diagnosis not present

## 2015-10-23 DIAGNOSIS — D509 Iron deficiency anemia, unspecified: Secondary | ICD-10-CM | POA: Diagnosis not present

## 2015-10-23 DIAGNOSIS — N186 End stage renal disease: Secondary | ICD-10-CM | POA: Diagnosis not present

## 2015-10-25 DIAGNOSIS — K760 Fatty (change of) liver, not elsewhere classified: Secondary | ICD-10-CM | POA: Diagnosis not present

## 2015-10-25 DIAGNOSIS — D509 Iron deficiency anemia, unspecified: Secondary | ICD-10-CM | POA: Diagnosis not present

## 2015-10-25 DIAGNOSIS — D631 Anemia in chronic kidney disease: Secondary | ICD-10-CM | POA: Diagnosis not present

## 2015-10-25 DIAGNOSIS — N186 End stage renal disease: Secondary | ICD-10-CM | POA: Diagnosis not present

## 2015-10-25 DIAGNOSIS — I2699 Other pulmonary embolism without acute cor pulmonale: Secondary | ICD-10-CM | POA: Diagnosis not present

## 2015-10-27 DIAGNOSIS — K760 Fatty (change of) liver, not elsewhere classified: Secondary | ICD-10-CM | POA: Diagnosis not present

## 2015-10-27 DIAGNOSIS — N186 End stage renal disease: Secondary | ICD-10-CM | POA: Diagnosis not present

## 2015-10-27 DIAGNOSIS — D631 Anemia in chronic kidney disease: Secondary | ICD-10-CM | POA: Diagnosis not present

## 2015-10-27 DIAGNOSIS — D509 Iron deficiency anemia, unspecified: Secondary | ICD-10-CM | POA: Diagnosis not present

## 2015-10-30 DIAGNOSIS — K760 Fatty (change of) liver, not elsewhere classified: Secondary | ICD-10-CM | POA: Diagnosis not present

## 2015-10-30 DIAGNOSIS — D631 Anemia in chronic kidney disease: Secondary | ICD-10-CM | POA: Diagnosis not present

## 2015-10-30 DIAGNOSIS — D509 Iron deficiency anemia, unspecified: Secondary | ICD-10-CM | POA: Diagnosis not present

## 2015-10-30 DIAGNOSIS — N186 End stage renal disease: Secondary | ICD-10-CM | POA: Diagnosis not present

## 2015-11-01 DIAGNOSIS — N186 End stage renal disease: Secondary | ICD-10-CM | POA: Diagnosis not present

## 2015-11-01 DIAGNOSIS — I2699 Other pulmonary embolism without acute cor pulmonale: Secondary | ICD-10-CM | POA: Diagnosis not present

## 2015-11-01 DIAGNOSIS — K760 Fatty (change of) liver, not elsewhere classified: Secondary | ICD-10-CM | POA: Diagnosis not present

## 2015-11-01 DIAGNOSIS — D509 Iron deficiency anemia, unspecified: Secondary | ICD-10-CM | POA: Diagnosis not present

## 2015-11-01 DIAGNOSIS — D631 Anemia in chronic kidney disease: Secondary | ICD-10-CM | POA: Diagnosis not present

## 2015-11-03 DIAGNOSIS — Z992 Dependence on renal dialysis: Secondary | ICD-10-CM | POA: Diagnosis not present

## 2015-11-03 DIAGNOSIS — N186 End stage renal disease: Secondary | ICD-10-CM | POA: Diagnosis not present

## 2015-11-03 DIAGNOSIS — Q612 Polycystic kidney, adult type: Secondary | ICD-10-CM | POA: Diagnosis not present

## 2015-11-04 DIAGNOSIS — D509 Iron deficiency anemia, unspecified: Secondary | ICD-10-CM | POA: Diagnosis not present

## 2015-11-04 DIAGNOSIS — K760 Fatty (change of) liver, not elsewhere classified: Secondary | ICD-10-CM | POA: Diagnosis not present

## 2015-11-04 DIAGNOSIS — N186 End stage renal disease: Secondary | ICD-10-CM | POA: Diagnosis not present

## 2015-11-04 DIAGNOSIS — D631 Anemia in chronic kidney disease: Secondary | ICD-10-CM | POA: Diagnosis not present

## 2015-11-08 DIAGNOSIS — I2699 Other pulmonary embolism without acute cor pulmonale: Secondary | ICD-10-CM | POA: Diagnosis not present

## 2015-11-15 DIAGNOSIS — I2699 Other pulmonary embolism without acute cor pulmonale: Secondary | ICD-10-CM | POA: Diagnosis not present

## 2015-11-22 DIAGNOSIS — I2699 Other pulmonary embolism without acute cor pulmonale: Secondary | ICD-10-CM | POA: Diagnosis not present

## 2015-11-29 DIAGNOSIS — I2699 Other pulmonary embolism without acute cor pulmonale: Secondary | ICD-10-CM | POA: Diagnosis not present

## 2015-12-04 DIAGNOSIS — Z992 Dependence on renal dialysis: Secondary | ICD-10-CM | POA: Diagnosis not present

## 2015-12-04 DIAGNOSIS — Q612 Polycystic kidney, adult type: Secondary | ICD-10-CM | POA: Diagnosis not present

## 2015-12-04 DIAGNOSIS — N186 End stage renal disease: Secondary | ICD-10-CM | POA: Diagnosis not present

## 2015-12-08 DIAGNOSIS — D631 Anemia in chronic kidney disease: Secondary | ICD-10-CM | POA: Diagnosis not present

## 2015-12-08 DIAGNOSIS — N2581 Secondary hyperparathyroidism of renal origin: Secondary | ICD-10-CM | POA: Diagnosis not present

## 2015-12-08 DIAGNOSIS — K760 Fatty (change of) liver, not elsewhere classified: Secondary | ICD-10-CM | POA: Diagnosis not present

## 2015-12-08 DIAGNOSIS — I2699 Other pulmonary embolism without acute cor pulmonale: Secondary | ICD-10-CM | POA: Diagnosis not present

## 2015-12-08 DIAGNOSIS — D509 Iron deficiency anemia, unspecified: Secondary | ICD-10-CM | POA: Diagnosis not present

## 2015-12-08 DIAGNOSIS — N186 End stage renal disease: Secondary | ICD-10-CM | POA: Diagnosis not present

## 2015-12-11 DIAGNOSIS — N186 End stage renal disease: Secondary | ICD-10-CM | POA: Diagnosis not present

## 2015-12-11 DIAGNOSIS — K760 Fatty (change of) liver, not elsewhere classified: Secondary | ICD-10-CM | POA: Diagnosis not present

## 2015-12-11 DIAGNOSIS — D631 Anemia in chronic kidney disease: Secondary | ICD-10-CM | POA: Diagnosis not present

## 2015-12-11 DIAGNOSIS — D509 Iron deficiency anemia, unspecified: Secondary | ICD-10-CM | POA: Diagnosis not present

## 2015-12-11 DIAGNOSIS — N2581 Secondary hyperparathyroidism of renal origin: Secondary | ICD-10-CM | POA: Diagnosis not present

## 2015-12-15 DIAGNOSIS — N2581 Secondary hyperparathyroidism of renal origin: Secondary | ICD-10-CM | POA: Diagnosis not present

## 2015-12-15 DIAGNOSIS — N186 End stage renal disease: Secondary | ICD-10-CM | POA: Diagnosis not present

## 2015-12-15 DIAGNOSIS — I2699 Other pulmonary embolism without acute cor pulmonale: Secondary | ICD-10-CM | POA: Diagnosis not present

## 2015-12-15 DIAGNOSIS — K760 Fatty (change of) liver, not elsewhere classified: Secondary | ICD-10-CM | POA: Diagnosis not present

## 2015-12-15 DIAGNOSIS — D631 Anemia in chronic kidney disease: Secondary | ICD-10-CM | POA: Diagnosis not present

## 2015-12-15 DIAGNOSIS — D509 Iron deficiency anemia, unspecified: Secondary | ICD-10-CM | POA: Diagnosis not present

## 2015-12-18 DIAGNOSIS — K760 Fatty (change of) liver, not elsewhere classified: Secondary | ICD-10-CM | POA: Diagnosis not present

## 2015-12-18 DIAGNOSIS — D631 Anemia in chronic kidney disease: Secondary | ICD-10-CM | POA: Diagnosis not present

## 2015-12-18 DIAGNOSIS — N186 End stage renal disease: Secondary | ICD-10-CM | POA: Diagnosis not present

## 2015-12-18 DIAGNOSIS — N2581 Secondary hyperparathyroidism of renal origin: Secondary | ICD-10-CM | POA: Diagnosis not present

## 2015-12-18 DIAGNOSIS — D509 Iron deficiency anemia, unspecified: Secondary | ICD-10-CM | POA: Diagnosis not present

## 2015-12-20 DIAGNOSIS — K760 Fatty (change of) liver, not elsewhere classified: Secondary | ICD-10-CM | POA: Diagnosis not present

## 2015-12-20 DIAGNOSIS — N186 End stage renal disease: Secondary | ICD-10-CM | POA: Diagnosis not present

## 2015-12-20 DIAGNOSIS — D631 Anemia in chronic kidney disease: Secondary | ICD-10-CM | POA: Diagnosis not present

## 2015-12-20 DIAGNOSIS — D509 Iron deficiency anemia, unspecified: Secondary | ICD-10-CM | POA: Diagnosis not present

## 2015-12-20 DIAGNOSIS — I2699 Other pulmonary embolism without acute cor pulmonale: Secondary | ICD-10-CM | POA: Diagnosis not present

## 2015-12-20 DIAGNOSIS — N2581 Secondary hyperparathyroidism of renal origin: Secondary | ICD-10-CM | POA: Diagnosis not present

## 2015-12-22 DIAGNOSIS — N2581 Secondary hyperparathyroidism of renal origin: Secondary | ICD-10-CM | POA: Diagnosis not present

## 2015-12-22 DIAGNOSIS — N186 End stage renal disease: Secondary | ICD-10-CM | POA: Diagnosis not present

## 2015-12-22 DIAGNOSIS — K760 Fatty (change of) liver, not elsewhere classified: Secondary | ICD-10-CM | POA: Diagnosis not present

## 2015-12-22 DIAGNOSIS — D631 Anemia in chronic kidney disease: Secondary | ICD-10-CM | POA: Diagnosis not present

## 2015-12-22 DIAGNOSIS — D509 Iron deficiency anemia, unspecified: Secondary | ICD-10-CM | POA: Diagnosis not present

## 2015-12-25 DIAGNOSIS — D631 Anemia in chronic kidney disease: Secondary | ICD-10-CM | POA: Diagnosis not present

## 2015-12-25 DIAGNOSIS — D509 Iron deficiency anemia, unspecified: Secondary | ICD-10-CM | POA: Diagnosis not present

## 2015-12-25 DIAGNOSIS — K760 Fatty (change of) liver, not elsewhere classified: Secondary | ICD-10-CM | POA: Diagnosis not present

## 2015-12-25 DIAGNOSIS — N186 End stage renal disease: Secondary | ICD-10-CM | POA: Diagnosis not present

## 2015-12-25 DIAGNOSIS — N2581 Secondary hyperparathyroidism of renal origin: Secondary | ICD-10-CM | POA: Diagnosis not present

## 2015-12-27 DIAGNOSIS — N2581 Secondary hyperparathyroidism of renal origin: Secondary | ICD-10-CM | POA: Diagnosis not present

## 2015-12-27 DIAGNOSIS — D509 Iron deficiency anemia, unspecified: Secondary | ICD-10-CM | POA: Diagnosis not present

## 2015-12-27 DIAGNOSIS — K760 Fatty (change of) liver, not elsewhere classified: Secondary | ICD-10-CM | POA: Diagnosis not present

## 2015-12-27 DIAGNOSIS — I2699 Other pulmonary embolism without acute cor pulmonale: Secondary | ICD-10-CM | POA: Diagnosis not present

## 2015-12-27 DIAGNOSIS — D631 Anemia in chronic kidney disease: Secondary | ICD-10-CM | POA: Diagnosis not present

## 2015-12-27 DIAGNOSIS — N186 End stage renal disease: Secondary | ICD-10-CM | POA: Diagnosis not present

## 2015-12-29 DIAGNOSIS — D631 Anemia in chronic kidney disease: Secondary | ICD-10-CM | POA: Diagnosis not present

## 2015-12-29 DIAGNOSIS — N186 End stage renal disease: Secondary | ICD-10-CM | POA: Diagnosis not present

## 2015-12-29 DIAGNOSIS — K760 Fatty (change of) liver, not elsewhere classified: Secondary | ICD-10-CM | POA: Diagnosis not present

## 2015-12-29 DIAGNOSIS — N2581 Secondary hyperparathyroidism of renal origin: Secondary | ICD-10-CM | POA: Diagnosis not present

## 2015-12-29 DIAGNOSIS — D509 Iron deficiency anemia, unspecified: Secondary | ICD-10-CM | POA: Diagnosis not present

## 2016-01-03 DIAGNOSIS — I2699 Other pulmonary embolism without acute cor pulmonale: Secondary | ICD-10-CM | POA: Diagnosis not present

## 2016-01-03 DIAGNOSIS — D631 Anemia in chronic kidney disease: Secondary | ICD-10-CM | POA: Diagnosis not present

## 2016-01-03 DIAGNOSIS — N2581 Secondary hyperparathyroidism of renal origin: Secondary | ICD-10-CM | POA: Diagnosis not present

## 2016-01-03 DIAGNOSIS — N186 End stage renal disease: Secondary | ICD-10-CM | POA: Diagnosis not present

## 2016-01-03 DIAGNOSIS — D509 Iron deficiency anemia, unspecified: Secondary | ICD-10-CM | POA: Diagnosis not present

## 2016-01-03 DIAGNOSIS — K760 Fatty (change of) liver, not elsewhere classified: Secondary | ICD-10-CM | POA: Diagnosis not present

## 2016-01-04 DIAGNOSIS — Z992 Dependence on renal dialysis: Secondary | ICD-10-CM | POA: Diagnosis not present

## 2016-01-04 DIAGNOSIS — Q612 Polycystic kidney, adult type: Secondary | ICD-10-CM | POA: Diagnosis not present

## 2016-01-04 DIAGNOSIS — N186 End stage renal disease: Secondary | ICD-10-CM | POA: Diagnosis not present

## 2016-01-05 DIAGNOSIS — D509 Iron deficiency anemia, unspecified: Secondary | ICD-10-CM | POA: Diagnosis not present

## 2016-01-05 DIAGNOSIS — K760 Fatty (change of) liver, not elsewhere classified: Secondary | ICD-10-CM | POA: Diagnosis not present

## 2016-01-05 DIAGNOSIS — Z23 Encounter for immunization: Secondary | ICD-10-CM | POA: Diagnosis not present

## 2016-01-05 DIAGNOSIS — N2581 Secondary hyperparathyroidism of renal origin: Secondary | ICD-10-CM | POA: Diagnosis not present

## 2016-01-05 DIAGNOSIS — N186 End stage renal disease: Secondary | ICD-10-CM | POA: Diagnosis not present

## 2016-01-05 DIAGNOSIS — D631 Anemia in chronic kidney disease: Secondary | ICD-10-CM | POA: Diagnosis not present

## 2016-01-08 DIAGNOSIS — D631 Anemia in chronic kidney disease: Secondary | ICD-10-CM | POA: Diagnosis not present

## 2016-01-08 DIAGNOSIS — D509 Iron deficiency anemia, unspecified: Secondary | ICD-10-CM | POA: Diagnosis not present

## 2016-01-08 DIAGNOSIS — K760 Fatty (change of) liver, not elsewhere classified: Secondary | ICD-10-CM | POA: Diagnosis not present

## 2016-01-08 DIAGNOSIS — Z23 Encounter for immunization: Secondary | ICD-10-CM | POA: Diagnosis not present

## 2016-01-08 DIAGNOSIS — N2581 Secondary hyperparathyroidism of renal origin: Secondary | ICD-10-CM | POA: Diagnosis not present

## 2016-01-08 DIAGNOSIS — N186 End stage renal disease: Secondary | ICD-10-CM | POA: Diagnosis not present

## 2016-01-10 DIAGNOSIS — D631 Anemia in chronic kidney disease: Secondary | ICD-10-CM | POA: Diagnosis not present

## 2016-01-10 DIAGNOSIS — Z23 Encounter for immunization: Secondary | ICD-10-CM | POA: Diagnosis not present

## 2016-01-10 DIAGNOSIS — N186 End stage renal disease: Secondary | ICD-10-CM | POA: Diagnosis not present

## 2016-01-10 DIAGNOSIS — K760 Fatty (change of) liver, not elsewhere classified: Secondary | ICD-10-CM | POA: Diagnosis not present

## 2016-01-10 DIAGNOSIS — N2581 Secondary hyperparathyroidism of renal origin: Secondary | ICD-10-CM | POA: Diagnosis not present

## 2016-01-10 DIAGNOSIS — D509 Iron deficiency anemia, unspecified: Secondary | ICD-10-CM | POA: Diagnosis not present

## 2016-01-10 DIAGNOSIS — I2699 Other pulmonary embolism without acute cor pulmonale: Secondary | ICD-10-CM | POA: Diagnosis not present

## 2016-01-12 DIAGNOSIS — K760 Fatty (change of) liver, not elsewhere classified: Secondary | ICD-10-CM | POA: Diagnosis not present

## 2016-01-12 DIAGNOSIS — N186 End stage renal disease: Secondary | ICD-10-CM | POA: Diagnosis not present

## 2016-01-12 DIAGNOSIS — D509 Iron deficiency anemia, unspecified: Secondary | ICD-10-CM | POA: Diagnosis not present

## 2016-01-12 DIAGNOSIS — N2581 Secondary hyperparathyroidism of renal origin: Secondary | ICD-10-CM | POA: Diagnosis not present

## 2016-01-12 DIAGNOSIS — D631 Anemia in chronic kidney disease: Secondary | ICD-10-CM | POA: Diagnosis not present

## 2016-01-12 DIAGNOSIS — Z23 Encounter for immunization: Secondary | ICD-10-CM | POA: Diagnosis not present

## 2016-01-15 DIAGNOSIS — K760 Fatty (change of) liver, not elsewhere classified: Secondary | ICD-10-CM | POA: Diagnosis not present

## 2016-01-15 DIAGNOSIS — N2581 Secondary hyperparathyroidism of renal origin: Secondary | ICD-10-CM | POA: Diagnosis not present

## 2016-01-15 DIAGNOSIS — N186 End stage renal disease: Secondary | ICD-10-CM | POA: Diagnosis not present

## 2016-01-15 DIAGNOSIS — D631 Anemia in chronic kidney disease: Secondary | ICD-10-CM | POA: Diagnosis not present

## 2016-01-15 DIAGNOSIS — Z23 Encounter for immunization: Secondary | ICD-10-CM | POA: Diagnosis not present

## 2016-01-15 DIAGNOSIS — D509 Iron deficiency anemia, unspecified: Secondary | ICD-10-CM | POA: Diagnosis not present

## 2016-01-17 DIAGNOSIS — N2581 Secondary hyperparathyroidism of renal origin: Secondary | ICD-10-CM | POA: Diagnosis not present

## 2016-01-17 DIAGNOSIS — D631 Anemia in chronic kidney disease: Secondary | ICD-10-CM | POA: Diagnosis not present

## 2016-01-17 DIAGNOSIS — D509 Iron deficiency anemia, unspecified: Secondary | ICD-10-CM | POA: Diagnosis not present

## 2016-01-17 DIAGNOSIS — Z23 Encounter for immunization: Secondary | ICD-10-CM | POA: Diagnosis not present

## 2016-01-17 DIAGNOSIS — I2699 Other pulmonary embolism without acute cor pulmonale: Secondary | ICD-10-CM | POA: Diagnosis not present

## 2016-01-17 DIAGNOSIS — N186 End stage renal disease: Secondary | ICD-10-CM | POA: Diagnosis not present

## 2016-01-17 DIAGNOSIS — K760 Fatty (change of) liver, not elsewhere classified: Secondary | ICD-10-CM | POA: Diagnosis not present

## 2016-01-19 DIAGNOSIS — D509 Iron deficiency anemia, unspecified: Secondary | ICD-10-CM | POA: Diagnosis not present

## 2016-01-19 DIAGNOSIS — N186 End stage renal disease: Secondary | ICD-10-CM | POA: Diagnosis not present

## 2016-01-19 DIAGNOSIS — N2581 Secondary hyperparathyroidism of renal origin: Secondary | ICD-10-CM | POA: Diagnosis not present

## 2016-01-19 DIAGNOSIS — Z23 Encounter for immunization: Secondary | ICD-10-CM | POA: Diagnosis not present

## 2016-01-19 DIAGNOSIS — K760 Fatty (change of) liver, not elsewhere classified: Secondary | ICD-10-CM | POA: Diagnosis not present

## 2016-01-19 DIAGNOSIS — D631 Anemia in chronic kidney disease: Secondary | ICD-10-CM | POA: Diagnosis not present

## 2016-01-22 DIAGNOSIS — N186 End stage renal disease: Secondary | ICD-10-CM | POA: Diagnosis not present

## 2016-01-22 DIAGNOSIS — K760 Fatty (change of) liver, not elsewhere classified: Secondary | ICD-10-CM | POA: Diagnosis not present

## 2016-01-22 DIAGNOSIS — N2581 Secondary hyperparathyroidism of renal origin: Secondary | ICD-10-CM | POA: Diagnosis not present

## 2016-01-22 DIAGNOSIS — D509 Iron deficiency anemia, unspecified: Secondary | ICD-10-CM | POA: Diagnosis not present

## 2016-01-22 DIAGNOSIS — D631 Anemia in chronic kidney disease: Secondary | ICD-10-CM | POA: Diagnosis not present

## 2016-01-22 DIAGNOSIS — Z23 Encounter for immunization: Secondary | ICD-10-CM | POA: Diagnosis not present

## 2016-01-24 DIAGNOSIS — Z23 Encounter for immunization: Secondary | ICD-10-CM | POA: Diagnosis not present

## 2016-01-24 DIAGNOSIS — D631 Anemia in chronic kidney disease: Secondary | ICD-10-CM | POA: Diagnosis not present

## 2016-01-24 DIAGNOSIS — D509 Iron deficiency anemia, unspecified: Secondary | ICD-10-CM | POA: Diagnosis not present

## 2016-01-24 DIAGNOSIS — N2581 Secondary hyperparathyroidism of renal origin: Secondary | ICD-10-CM | POA: Diagnosis not present

## 2016-01-24 DIAGNOSIS — I2699 Other pulmonary embolism without acute cor pulmonale: Secondary | ICD-10-CM | POA: Diagnosis not present

## 2016-01-24 DIAGNOSIS — K760 Fatty (change of) liver, not elsewhere classified: Secondary | ICD-10-CM | POA: Diagnosis not present

## 2016-01-24 DIAGNOSIS — N186 End stage renal disease: Secondary | ICD-10-CM | POA: Diagnosis not present

## 2016-01-26 DIAGNOSIS — Z23 Encounter for immunization: Secondary | ICD-10-CM | POA: Diagnosis not present

## 2016-01-26 DIAGNOSIS — D509 Iron deficiency anemia, unspecified: Secondary | ICD-10-CM | POA: Diagnosis not present

## 2016-01-26 DIAGNOSIS — K760 Fatty (change of) liver, not elsewhere classified: Secondary | ICD-10-CM | POA: Diagnosis not present

## 2016-01-26 DIAGNOSIS — N186 End stage renal disease: Secondary | ICD-10-CM | POA: Diagnosis not present

## 2016-01-26 DIAGNOSIS — D631 Anemia in chronic kidney disease: Secondary | ICD-10-CM | POA: Diagnosis not present

## 2016-01-26 DIAGNOSIS — N2581 Secondary hyperparathyroidism of renal origin: Secondary | ICD-10-CM | POA: Diagnosis not present

## 2016-01-29 DIAGNOSIS — N186 End stage renal disease: Secondary | ICD-10-CM | POA: Diagnosis not present

## 2016-01-29 DIAGNOSIS — K760 Fatty (change of) liver, not elsewhere classified: Secondary | ICD-10-CM | POA: Diagnosis not present

## 2016-01-29 DIAGNOSIS — D631 Anemia in chronic kidney disease: Secondary | ICD-10-CM | POA: Diagnosis not present

## 2016-01-29 DIAGNOSIS — Z23 Encounter for immunization: Secondary | ICD-10-CM | POA: Diagnosis not present

## 2016-01-29 DIAGNOSIS — D509 Iron deficiency anemia, unspecified: Secondary | ICD-10-CM | POA: Diagnosis not present

## 2016-01-29 DIAGNOSIS — N2581 Secondary hyperparathyroidism of renal origin: Secondary | ICD-10-CM | POA: Diagnosis not present

## 2016-01-31 DIAGNOSIS — Z23 Encounter for immunization: Secondary | ICD-10-CM | POA: Diagnosis not present

## 2016-01-31 DIAGNOSIS — D631 Anemia in chronic kidney disease: Secondary | ICD-10-CM | POA: Diagnosis not present

## 2016-01-31 DIAGNOSIS — K760 Fatty (change of) liver, not elsewhere classified: Secondary | ICD-10-CM | POA: Diagnosis not present

## 2016-01-31 DIAGNOSIS — N186 End stage renal disease: Secondary | ICD-10-CM | POA: Diagnosis not present

## 2016-01-31 DIAGNOSIS — N2581 Secondary hyperparathyroidism of renal origin: Secondary | ICD-10-CM | POA: Diagnosis not present

## 2016-01-31 DIAGNOSIS — I2699 Other pulmonary embolism without acute cor pulmonale: Secondary | ICD-10-CM | POA: Diagnosis not present

## 2016-01-31 DIAGNOSIS — D509 Iron deficiency anemia, unspecified: Secondary | ICD-10-CM | POA: Diagnosis not present

## 2016-02-02 DIAGNOSIS — D631 Anemia in chronic kidney disease: Secondary | ICD-10-CM | POA: Diagnosis not present

## 2016-02-02 DIAGNOSIS — K760 Fatty (change of) liver, not elsewhere classified: Secondary | ICD-10-CM | POA: Diagnosis not present

## 2016-02-02 DIAGNOSIS — N186 End stage renal disease: Secondary | ICD-10-CM | POA: Diagnosis not present

## 2016-02-02 DIAGNOSIS — Z23 Encounter for immunization: Secondary | ICD-10-CM | POA: Diagnosis not present

## 2016-02-02 DIAGNOSIS — N2581 Secondary hyperparathyroidism of renal origin: Secondary | ICD-10-CM | POA: Diagnosis not present

## 2016-02-02 DIAGNOSIS — D509 Iron deficiency anemia, unspecified: Secondary | ICD-10-CM | POA: Diagnosis not present

## 2016-02-03 DIAGNOSIS — N186 End stage renal disease: Secondary | ICD-10-CM | POA: Diagnosis not present

## 2016-02-03 DIAGNOSIS — Q612 Polycystic kidney, adult type: Secondary | ICD-10-CM | POA: Diagnosis not present

## 2016-02-03 DIAGNOSIS — Z992 Dependence on renal dialysis: Secondary | ICD-10-CM | POA: Diagnosis not present

## 2016-02-05 DIAGNOSIS — D631 Anemia in chronic kidney disease: Secondary | ICD-10-CM | POA: Diagnosis not present

## 2016-02-05 DIAGNOSIS — K760 Fatty (change of) liver, not elsewhere classified: Secondary | ICD-10-CM | POA: Diagnosis not present

## 2016-02-05 DIAGNOSIS — N186 End stage renal disease: Secondary | ICD-10-CM | POA: Diagnosis not present

## 2016-02-05 DIAGNOSIS — D509 Iron deficiency anemia, unspecified: Secondary | ICD-10-CM | POA: Diagnosis not present

## 2016-02-05 DIAGNOSIS — N2581 Secondary hyperparathyroidism of renal origin: Secondary | ICD-10-CM | POA: Diagnosis not present

## 2016-02-07 DIAGNOSIS — D509 Iron deficiency anemia, unspecified: Secondary | ICD-10-CM | POA: Diagnosis not present

## 2016-02-07 DIAGNOSIS — I2699 Other pulmonary embolism without acute cor pulmonale: Secondary | ICD-10-CM | POA: Diagnosis not present

## 2016-02-07 DIAGNOSIS — N2581 Secondary hyperparathyroidism of renal origin: Secondary | ICD-10-CM | POA: Diagnosis not present

## 2016-02-07 DIAGNOSIS — N186 End stage renal disease: Secondary | ICD-10-CM | POA: Diagnosis not present

## 2016-02-07 DIAGNOSIS — K760 Fatty (change of) liver, not elsewhere classified: Secondary | ICD-10-CM | POA: Diagnosis not present

## 2016-02-07 DIAGNOSIS — D631 Anemia in chronic kidney disease: Secondary | ICD-10-CM | POA: Diagnosis not present

## 2016-02-09 DIAGNOSIS — D631 Anemia in chronic kidney disease: Secondary | ICD-10-CM | POA: Diagnosis not present

## 2016-02-09 DIAGNOSIS — N186 End stage renal disease: Secondary | ICD-10-CM | POA: Diagnosis not present

## 2016-02-09 DIAGNOSIS — N2581 Secondary hyperparathyroidism of renal origin: Secondary | ICD-10-CM | POA: Diagnosis not present

## 2016-02-09 DIAGNOSIS — K760 Fatty (change of) liver, not elsewhere classified: Secondary | ICD-10-CM | POA: Diagnosis not present

## 2016-02-09 DIAGNOSIS — D509 Iron deficiency anemia, unspecified: Secondary | ICD-10-CM | POA: Diagnosis not present

## 2016-02-12 DIAGNOSIS — K760 Fatty (change of) liver, not elsewhere classified: Secondary | ICD-10-CM | POA: Diagnosis not present

## 2016-02-12 DIAGNOSIS — D509 Iron deficiency anemia, unspecified: Secondary | ICD-10-CM | POA: Diagnosis not present

## 2016-02-12 DIAGNOSIS — D631 Anemia in chronic kidney disease: Secondary | ICD-10-CM | POA: Diagnosis not present

## 2016-02-12 DIAGNOSIS — N186 End stage renal disease: Secondary | ICD-10-CM | POA: Diagnosis not present

## 2016-02-12 DIAGNOSIS — N2581 Secondary hyperparathyroidism of renal origin: Secondary | ICD-10-CM | POA: Diagnosis not present

## 2016-02-14 DIAGNOSIS — K760 Fatty (change of) liver, not elsewhere classified: Secondary | ICD-10-CM | POA: Diagnosis not present

## 2016-02-14 DIAGNOSIS — N2581 Secondary hyperparathyroidism of renal origin: Secondary | ICD-10-CM | POA: Diagnosis not present

## 2016-02-14 DIAGNOSIS — N186 End stage renal disease: Secondary | ICD-10-CM | POA: Diagnosis not present

## 2016-02-14 DIAGNOSIS — I2699 Other pulmonary embolism without acute cor pulmonale: Secondary | ICD-10-CM | POA: Diagnosis not present

## 2016-02-14 DIAGNOSIS — D631 Anemia in chronic kidney disease: Secondary | ICD-10-CM | POA: Diagnosis not present

## 2016-02-14 DIAGNOSIS — D509 Iron deficiency anemia, unspecified: Secondary | ICD-10-CM | POA: Diagnosis not present

## 2016-02-16 DIAGNOSIS — D509 Iron deficiency anemia, unspecified: Secondary | ICD-10-CM | POA: Diagnosis not present

## 2016-02-16 DIAGNOSIS — N2581 Secondary hyperparathyroidism of renal origin: Secondary | ICD-10-CM | POA: Diagnosis not present

## 2016-02-16 DIAGNOSIS — N186 End stage renal disease: Secondary | ICD-10-CM | POA: Diagnosis not present

## 2016-02-16 DIAGNOSIS — D631 Anemia in chronic kidney disease: Secondary | ICD-10-CM | POA: Diagnosis not present

## 2016-02-16 DIAGNOSIS — K760 Fatty (change of) liver, not elsewhere classified: Secondary | ICD-10-CM | POA: Diagnosis not present

## 2016-02-19 DIAGNOSIS — D509 Iron deficiency anemia, unspecified: Secondary | ICD-10-CM | POA: Diagnosis not present

## 2016-02-19 DIAGNOSIS — N2581 Secondary hyperparathyroidism of renal origin: Secondary | ICD-10-CM | POA: Diagnosis not present

## 2016-02-19 DIAGNOSIS — K760 Fatty (change of) liver, not elsewhere classified: Secondary | ICD-10-CM | POA: Diagnosis not present

## 2016-02-19 DIAGNOSIS — N186 End stage renal disease: Secondary | ICD-10-CM | POA: Diagnosis not present

## 2016-02-19 DIAGNOSIS — D631 Anemia in chronic kidney disease: Secondary | ICD-10-CM | POA: Diagnosis not present

## 2016-02-23 DIAGNOSIS — N2581 Secondary hyperparathyroidism of renal origin: Secondary | ICD-10-CM | POA: Diagnosis not present

## 2016-02-23 DIAGNOSIS — I2699 Other pulmonary embolism without acute cor pulmonale: Secondary | ICD-10-CM | POA: Diagnosis not present

## 2016-02-23 DIAGNOSIS — D631 Anemia in chronic kidney disease: Secondary | ICD-10-CM | POA: Diagnosis not present

## 2016-02-23 DIAGNOSIS — D509 Iron deficiency anemia, unspecified: Secondary | ICD-10-CM | POA: Diagnosis not present

## 2016-02-23 DIAGNOSIS — N186 End stage renal disease: Secondary | ICD-10-CM | POA: Diagnosis not present

## 2016-02-23 DIAGNOSIS — K760 Fatty (change of) liver, not elsewhere classified: Secondary | ICD-10-CM | POA: Diagnosis not present

## 2016-02-26 DIAGNOSIS — N2581 Secondary hyperparathyroidism of renal origin: Secondary | ICD-10-CM | POA: Diagnosis not present

## 2016-02-26 DIAGNOSIS — D631 Anemia in chronic kidney disease: Secondary | ICD-10-CM | POA: Diagnosis not present

## 2016-02-26 DIAGNOSIS — N186 End stage renal disease: Secondary | ICD-10-CM | POA: Diagnosis not present

## 2016-02-26 DIAGNOSIS — D509 Iron deficiency anemia, unspecified: Secondary | ICD-10-CM | POA: Diagnosis not present

## 2016-02-26 DIAGNOSIS — K760 Fatty (change of) liver, not elsewhere classified: Secondary | ICD-10-CM | POA: Diagnosis not present

## 2016-02-28 DIAGNOSIS — D509 Iron deficiency anemia, unspecified: Secondary | ICD-10-CM | POA: Diagnosis not present

## 2016-02-28 DIAGNOSIS — D631 Anemia in chronic kidney disease: Secondary | ICD-10-CM | POA: Diagnosis not present

## 2016-02-28 DIAGNOSIS — K760 Fatty (change of) liver, not elsewhere classified: Secondary | ICD-10-CM | POA: Diagnosis not present

## 2016-02-28 DIAGNOSIS — I2699 Other pulmonary embolism without acute cor pulmonale: Secondary | ICD-10-CM | POA: Diagnosis not present

## 2016-02-28 DIAGNOSIS — N186 End stage renal disease: Secondary | ICD-10-CM | POA: Diagnosis not present

## 2016-02-28 DIAGNOSIS — N2581 Secondary hyperparathyroidism of renal origin: Secondary | ICD-10-CM | POA: Diagnosis not present

## 2016-03-01 DIAGNOSIS — D509 Iron deficiency anemia, unspecified: Secondary | ICD-10-CM | POA: Diagnosis not present

## 2016-03-01 DIAGNOSIS — N2581 Secondary hyperparathyroidism of renal origin: Secondary | ICD-10-CM | POA: Diagnosis not present

## 2016-03-01 DIAGNOSIS — D631 Anemia in chronic kidney disease: Secondary | ICD-10-CM | POA: Diagnosis not present

## 2016-03-01 DIAGNOSIS — N186 End stage renal disease: Secondary | ICD-10-CM | POA: Diagnosis not present

## 2016-03-01 DIAGNOSIS — K760 Fatty (change of) liver, not elsewhere classified: Secondary | ICD-10-CM | POA: Diagnosis not present

## 2016-03-04 DIAGNOSIS — N2581 Secondary hyperparathyroidism of renal origin: Secondary | ICD-10-CM | POA: Diagnosis not present

## 2016-03-04 DIAGNOSIS — N186 End stage renal disease: Secondary | ICD-10-CM | POA: Diagnosis not present

## 2016-03-04 DIAGNOSIS — K760 Fatty (change of) liver, not elsewhere classified: Secondary | ICD-10-CM | POA: Diagnosis not present

## 2016-03-04 DIAGNOSIS — D631 Anemia in chronic kidney disease: Secondary | ICD-10-CM | POA: Diagnosis not present

## 2016-03-04 DIAGNOSIS — D509 Iron deficiency anemia, unspecified: Secondary | ICD-10-CM | POA: Diagnosis not present

## 2016-03-05 DIAGNOSIS — N186 End stage renal disease: Secondary | ICD-10-CM | POA: Diagnosis not present

## 2016-03-05 DIAGNOSIS — Q612 Polycystic kidney, adult type: Secondary | ICD-10-CM | POA: Diagnosis not present

## 2016-03-05 DIAGNOSIS — Z992 Dependence on renal dialysis: Secondary | ICD-10-CM | POA: Diagnosis not present

## 2016-03-08 DIAGNOSIS — N2581 Secondary hyperparathyroidism of renal origin: Secondary | ICD-10-CM | POA: Diagnosis not present

## 2016-03-08 DIAGNOSIS — D509 Iron deficiency anemia, unspecified: Secondary | ICD-10-CM | POA: Diagnosis not present

## 2016-03-08 DIAGNOSIS — N186 End stage renal disease: Secondary | ICD-10-CM | POA: Diagnosis not present

## 2016-03-08 DIAGNOSIS — D631 Anemia in chronic kidney disease: Secondary | ICD-10-CM | POA: Diagnosis not present

## 2016-03-08 DIAGNOSIS — K760 Fatty (change of) liver, not elsewhere classified: Secondary | ICD-10-CM | POA: Diagnosis not present

## 2016-03-08 DIAGNOSIS — I2699 Other pulmonary embolism without acute cor pulmonale: Secondary | ICD-10-CM | POA: Diagnosis not present

## 2016-03-11 DIAGNOSIS — N186 End stage renal disease: Secondary | ICD-10-CM | POA: Diagnosis not present

## 2016-03-11 DIAGNOSIS — N2581 Secondary hyperparathyroidism of renal origin: Secondary | ICD-10-CM | POA: Diagnosis not present

## 2016-03-11 DIAGNOSIS — D509 Iron deficiency anemia, unspecified: Secondary | ICD-10-CM | POA: Diagnosis not present

## 2016-03-11 DIAGNOSIS — K760 Fatty (change of) liver, not elsewhere classified: Secondary | ICD-10-CM | POA: Diagnosis not present

## 2016-03-11 DIAGNOSIS — D631 Anemia in chronic kidney disease: Secondary | ICD-10-CM | POA: Diagnosis not present

## 2016-03-13 DIAGNOSIS — D509 Iron deficiency anemia, unspecified: Secondary | ICD-10-CM | POA: Diagnosis not present

## 2016-03-13 DIAGNOSIS — N2581 Secondary hyperparathyroidism of renal origin: Secondary | ICD-10-CM | POA: Diagnosis not present

## 2016-03-13 DIAGNOSIS — K760 Fatty (change of) liver, not elsewhere classified: Secondary | ICD-10-CM | POA: Diagnosis not present

## 2016-03-13 DIAGNOSIS — D631 Anemia in chronic kidney disease: Secondary | ICD-10-CM | POA: Diagnosis not present

## 2016-03-13 DIAGNOSIS — I2699 Other pulmonary embolism without acute cor pulmonale: Secondary | ICD-10-CM | POA: Diagnosis not present

## 2016-03-13 DIAGNOSIS — N186 End stage renal disease: Secondary | ICD-10-CM | POA: Diagnosis not present

## 2016-03-15 DIAGNOSIS — K760 Fatty (change of) liver, not elsewhere classified: Secondary | ICD-10-CM | POA: Diagnosis not present

## 2016-03-15 DIAGNOSIS — N186 End stage renal disease: Secondary | ICD-10-CM | POA: Diagnosis not present

## 2016-03-15 DIAGNOSIS — D631 Anemia in chronic kidney disease: Secondary | ICD-10-CM | POA: Diagnosis not present

## 2016-03-15 DIAGNOSIS — D509 Iron deficiency anemia, unspecified: Secondary | ICD-10-CM | POA: Diagnosis not present

## 2016-03-15 DIAGNOSIS — N2581 Secondary hyperparathyroidism of renal origin: Secondary | ICD-10-CM | POA: Diagnosis not present

## 2016-03-18 DIAGNOSIS — N2581 Secondary hyperparathyroidism of renal origin: Secondary | ICD-10-CM | POA: Diagnosis not present

## 2016-03-18 DIAGNOSIS — N186 End stage renal disease: Secondary | ICD-10-CM | POA: Diagnosis not present

## 2016-03-18 DIAGNOSIS — D509 Iron deficiency anemia, unspecified: Secondary | ICD-10-CM | POA: Diagnosis not present

## 2016-03-18 DIAGNOSIS — K760 Fatty (change of) liver, not elsewhere classified: Secondary | ICD-10-CM | POA: Diagnosis not present

## 2016-03-18 DIAGNOSIS — D631 Anemia in chronic kidney disease: Secondary | ICD-10-CM | POA: Diagnosis not present

## 2016-03-20 DIAGNOSIS — I2699 Other pulmonary embolism without acute cor pulmonale: Secondary | ICD-10-CM | POA: Diagnosis not present

## 2016-03-20 DIAGNOSIS — D509 Iron deficiency anemia, unspecified: Secondary | ICD-10-CM | POA: Diagnosis not present

## 2016-03-20 DIAGNOSIS — D631 Anemia in chronic kidney disease: Secondary | ICD-10-CM | POA: Diagnosis not present

## 2016-03-20 DIAGNOSIS — N2581 Secondary hyperparathyroidism of renal origin: Secondary | ICD-10-CM | POA: Diagnosis not present

## 2016-03-20 DIAGNOSIS — K760 Fatty (change of) liver, not elsewhere classified: Secondary | ICD-10-CM | POA: Diagnosis not present

## 2016-03-20 DIAGNOSIS — N186 End stage renal disease: Secondary | ICD-10-CM | POA: Diagnosis not present

## 2016-03-22 DIAGNOSIS — K760 Fatty (change of) liver, not elsewhere classified: Secondary | ICD-10-CM | POA: Diagnosis not present

## 2016-03-22 DIAGNOSIS — D631 Anemia in chronic kidney disease: Secondary | ICD-10-CM | POA: Diagnosis not present

## 2016-03-22 DIAGNOSIS — D509 Iron deficiency anemia, unspecified: Secondary | ICD-10-CM | POA: Diagnosis not present

## 2016-03-22 DIAGNOSIS — N186 End stage renal disease: Secondary | ICD-10-CM | POA: Diagnosis not present

## 2016-03-22 DIAGNOSIS — N2581 Secondary hyperparathyroidism of renal origin: Secondary | ICD-10-CM | POA: Diagnosis not present

## 2016-03-24 DIAGNOSIS — N186 End stage renal disease: Secondary | ICD-10-CM | POA: Diagnosis not present

## 2016-03-24 DIAGNOSIS — D509 Iron deficiency anemia, unspecified: Secondary | ICD-10-CM | POA: Diagnosis not present

## 2016-03-24 DIAGNOSIS — K760 Fatty (change of) liver, not elsewhere classified: Secondary | ICD-10-CM | POA: Diagnosis not present

## 2016-03-24 DIAGNOSIS — D631 Anemia in chronic kidney disease: Secondary | ICD-10-CM | POA: Diagnosis not present

## 2016-03-24 DIAGNOSIS — N2581 Secondary hyperparathyroidism of renal origin: Secondary | ICD-10-CM | POA: Diagnosis not present

## 2016-03-26 DIAGNOSIS — N186 End stage renal disease: Secondary | ICD-10-CM | POA: Diagnosis not present

## 2016-03-26 DIAGNOSIS — N2581 Secondary hyperparathyroidism of renal origin: Secondary | ICD-10-CM | POA: Diagnosis not present

## 2016-03-26 DIAGNOSIS — D631 Anemia in chronic kidney disease: Secondary | ICD-10-CM | POA: Diagnosis not present

## 2016-03-26 DIAGNOSIS — D509 Iron deficiency anemia, unspecified: Secondary | ICD-10-CM | POA: Diagnosis not present

## 2016-03-26 DIAGNOSIS — K760 Fatty (change of) liver, not elsewhere classified: Secondary | ICD-10-CM | POA: Diagnosis not present

## 2016-03-26 DIAGNOSIS — I2699 Other pulmonary embolism without acute cor pulmonale: Secondary | ICD-10-CM | POA: Diagnosis not present

## 2016-03-29 DIAGNOSIS — K760 Fatty (change of) liver, not elsewhere classified: Secondary | ICD-10-CM | POA: Diagnosis not present

## 2016-03-29 DIAGNOSIS — D509 Iron deficiency anemia, unspecified: Secondary | ICD-10-CM | POA: Diagnosis not present

## 2016-03-29 DIAGNOSIS — D631 Anemia in chronic kidney disease: Secondary | ICD-10-CM | POA: Diagnosis not present

## 2016-03-29 DIAGNOSIS — N2581 Secondary hyperparathyroidism of renal origin: Secondary | ICD-10-CM | POA: Diagnosis not present

## 2016-03-29 DIAGNOSIS — N186 End stage renal disease: Secondary | ICD-10-CM | POA: Diagnosis not present

## 2016-04-01 DIAGNOSIS — D509 Iron deficiency anemia, unspecified: Secondary | ICD-10-CM | POA: Diagnosis not present

## 2016-04-01 DIAGNOSIS — N186 End stage renal disease: Secondary | ICD-10-CM | POA: Diagnosis not present

## 2016-04-01 DIAGNOSIS — N2581 Secondary hyperparathyroidism of renal origin: Secondary | ICD-10-CM | POA: Diagnosis not present

## 2016-04-01 DIAGNOSIS — K760 Fatty (change of) liver, not elsewhere classified: Secondary | ICD-10-CM | POA: Diagnosis not present

## 2016-04-01 DIAGNOSIS — D631 Anemia in chronic kidney disease: Secondary | ICD-10-CM | POA: Diagnosis not present

## 2016-04-04 DIAGNOSIS — Z992 Dependence on renal dialysis: Secondary | ICD-10-CM | POA: Diagnosis not present

## 2016-04-04 DIAGNOSIS — N186 End stage renal disease: Secondary | ICD-10-CM | POA: Diagnosis not present

## 2016-04-04 DIAGNOSIS — Q612 Polycystic kidney, adult type: Secondary | ICD-10-CM | POA: Diagnosis not present

## 2016-04-05 DIAGNOSIS — D631 Anemia in chronic kidney disease: Secondary | ICD-10-CM | POA: Diagnosis not present

## 2016-04-05 DIAGNOSIS — N2581 Secondary hyperparathyroidism of renal origin: Secondary | ICD-10-CM | POA: Diagnosis not present

## 2016-04-05 DIAGNOSIS — N186 End stage renal disease: Secondary | ICD-10-CM | POA: Diagnosis not present

## 2016-04-05 DIAGNOSIS — D509 Iron deficiency anemia, unspecified: Secondary | ICD-10-CM | POA: Diagnosis not present

## 2016-04-05 DIAGNOSIS — K760 Fatty (change of) liver, not elsewhere classified: Secondary | ICD-10-CM | POA: Diagnosis not present

## 2016-04-10 DIAGNOSIS — I2699 Other pulmonary embolism without acute cor pulmonale: Secondary | ICD-10-CM | POA: Diagnosis not present

## 2016-04-17 DIAGNOSIS — I2699 Other pulmonary embolism without acute cor pulmonale: Secondary | ICD-10-CM | POA: Diagnosis not present

## 2016-04-24 DIAGNOSIS — I2699 Other pulmonary embolism without acute cor pulmonale: Secondary | ICD-10-CM | POA: Diagnosis not present

## 2016-04-30 DIAGNOSIS — N186 End stage renal disease: Secondary | ICD-10-CM | POA: Diagnosis not present

## 2016-04-30 DIAGNOSIS — N2581 Secondary hyperparathyroidism of renal origin: Secondary | ICD-10-CM | POA: Diagnosis not present

## 2016-05-03 DIAGNOSIS — I2699 Other pulmonary embolism without acute cor pulmonale: Secondary | ICD-10-CM | POA: Diagnosis not present

## 2016-05-05 DIAGNOSIS — Q612 Polycystic kidney, adult type: Secondary | ICD-10-CM | POA: Diagnosis not present

## 2016-05-05 DIAGNOSIS — Z992 Dependence on renal dialysis: Secondary | ICD-10-CM | POA: Diagnosis not present

## 2016-05-05 DIAGNOSIS — N186 End stage renal disease: Secondary | ICD-10-CM | POA: Diagnosis not present

## 2016-05-08 DIAGNOSIS — N2581 Secondary hyperparathyroidism of renal origin: Secondary | ICD-10-CM | POA: Diagnosis not present

## 2016-05-08 DIAGNOSIS — I2699 Other pulmonary embolism without acute cor pulmonale: Secondary | ICD-10-CM | POA: Diagnosis not present

## 2016-05-08 DIAGNOSIS — D696 Thrombocytopenia, unspecified: Secondary | ICD-10-CM | POA: Diagnosis not present

## 2016-05-08 DIAGNOSIS — D509 Iron deficiency anemia, unspecified: Secondary | ICD-10-CM | POA: Diagnosis not present

## 2016-05-08 DIAGNOSIS — K760 Fatty (change of) liver, not elsewhere classified: Secondary | ICD-10-CM | POA: Diagnosis not present

## 2016-05-08 DIAGNOSIS — D631 Anemia in chronic kidney disease: Secondary | ICD-10-CM | POA: Diagnosis not present

## 2016-05-08 DIAGNOSIS — N186 End stage renal disease: Secondary | ICD-10-CM | POA: Diagnosis not present

## 2016-05-10 DIAGNOSIS — K760 Fatty (change of) liver, not elsewhere classified: Secondary | ICD-10-CM | POA: Diagnosis not present

## 2016-05-10 DIAGNOSIS — D696 Thrombocytopenia, unspecified: Secondary | ICD-10-CM | POA: Diagnosis not present

## 2016-05-10 DIAGNOSIS — D631 Anemia in chronic kidney disease: Secondary | ICD-10-CM | POA: Diagnosis not present

## 2016-05-10 DIAGNOSIS — N186 End stage renal disease: Secondary | ICD-10-CM | POA: Diagnosis not present

## 2016-05-10 DIAGNOSIS — D509 Iron deficiency anemia, unspecified: Secondary | ICD-10-CM | POA: Diagnosis not present

## 2016-05-10 DIAGNOSIS — N2581 Secondary hyperparathyroidism of renal origin: Secondary | ICD-10-CM | POA: Diagnosis not present

## 2016-05-13 DIAGNOSIS — D509 Iron deficiency anemia, unspecified: Secondary | ICD-10-CM | POA: Diagnosis not present

## 2016-05-13 DIAGNOSIS — N186 End stage renal disease: Secondary | ICD-10-CM | POA: Diagnosis not present

## 2016-05-13 DIAGNOSIS — D631 Anemia in chronic kidney disease: Secondary | ICD-10-CM | POA: Diagnosis not present

## 2016-05-13 DIAGNOSIS — D696 Thrombocytopenia, unspecified: Secondary | ICD-10-CM | POA: Diagnosis not present

## 2016-05-13 DIAGNOSIS — N2581 Secondary hyperparathyroidism of renal origin: Secondary | ICD-10-CM | POA: Diagnosis not present

## 2016-05-13 DIAGNOSIS — K760 Fatty (change of) liver, not elsewhere classified: Secondary | ICD-10-CM | POA: Diagnosis not present

## 2016-05-15 DIAGNOSIS — D696 Thrombocytopenia, unspecified: Secondary | ICD-10-CM | POA: Diagnosis not present

## 2016-05-15 DIAGNOSIS — N2581 Secondary hyperparathyroidism of renal origin: Secondary | ICD-10-CM | POA: Diagnosis not present

## 2016-05-15 DIAGNOSIS — D509 Iron deficiency anemia, unspecified: Secondary | ICD-10-CM | POA: Diagnosis not present

## 2016-05-15 DIAGNOSIS — D631 Anemia in chronic kidney disease: Secondary | ICD-10-CM | POA: Diagnosis not present

## 2016-05-15 DIAGNOSIS — I2699 Other pulmonary embolism without acute cor pulmonale: Secondary | ICD-10-CM | POA: Diagnosis not present

## 2016-05-15 DIAGNOSIS — K760 Fatty (change of) liver, not elsewhere classified: Secondary | ICD-10-CM | POA: Diagnosis not present

## 2016-05-15 DIAGNOSIS — N186 End stage renal disease: Secondary | ICD-10-CM | POA: Diagnosis not present

## 2016-05-17 DIAGNOSIS — N186 End stage renal disease: Secondary | ICD-10-CM | POA: Diagnosis not present

## 2016-05-17 DIAGNOSIS — K760 Fatty (change of) liver, not elsewhere classified: Secondary | ICD-10-CM | POA: Diagnosis not present

## 2016-05-17 DIAGNOSIS — D696 Thrombocytopenia, unspecified: Secondary | ICD-10-CM | POA: Diagnosis not present

## 2016-05-17 DIAGNOSIS — D509 Iron deficiency anemia, unspecified: Secondary | ICD-10-CM | POA: Diagnosis not present

## 2016-05-17 DIAGNOSIS — D631 Anemia in chronic kidney disease: Secondary | ICD-10-CM | POA: Diagnosis not present

## 2016-05-17 DIAGNOSIS — N2581 Secondary hyperparathyroidism of renal origin: Secondary | ICD-10-CM | POA: Diagnosis not present

## 2016-05-20 DIAGNOSIS — D631 Anemia in chronic kidney disease: Secondary | ICD-10-CM | POA: Diagnosis not present

## 2016-05-20 DIAGNOSIS — N2581 Secondary hyperparathyroidism of renal origin: Secondary | ICD-10-CM | POA: Diagnosis not present

## 2016-05-20 DIAGNOSIS — D509 Iron deficiency anemia, unspecified: Secondary | ICD-10-CM | POA: Diagnosis not present

## 2016-05-20 DIAGNOSIS — D696 Thrombocytopenia, unspecified: Secondary | ICD-10-CM | POA: Diagnosis not present

## 2016-05-20 DIAGNOSIS — N186 End stage renal disease: Secondary | ICD-10-CM | POA: Diagnosis not present

## 2016-05-20 DIAGNOSIS — K760 Fatty (change of) liver, not elsewhere classified: Secondary | ICD-10-CM | POA: Diagnosis not present

## 2016-05-24 DIAGNOSIS — D631 Anemia in chronic kidney disease: Secondary | ICD-10-CM | POA: Diagnosis not present

## 2016-05-24 DIAGNOSIS — I2699 Other pulmonary embolism without acute cor pulmonale: Secondary | ICD-10-CM | POA: Diagnosis not present

## 2016-05-24 DIAGNOSIS — N186 End stage renal disease: Secondary | ICD-10-CM | POA: Diagnosis not present

## 2016-05-24 DIAGNOSIS — K760 Fatty (change of) liver, not elsewhere classified: Secondary | ICD-10-CM | POA: Diagnosis not present

## 2016-05-24 DIAGNOSIS — D509 Iron deficiency anemia, unspecified: Secondary | ICD-10-CM | POA: Diagnosis not present

## 2016-05-24 DIAGNOSIS — N2581 Secondary hyperparathyroidism of renal origin: Secondary | ICD-10-CM | POA: Diagnosis not present

## 2016-05-24 DIAGNOSIS — D696 Thrombocytopenia, unspecified: Secondary | ICD-10-CM | POA: Diagnosis not present

## 2016-05-27 DIAGNOSIS — D509 Iron deficiency anemia, unspecified: Secondary | ICD-10-CM | POA: Diagnosis not present

## 2016-05-27 DIAGNOSIS — D696 Thrombocytopenia, unspecified: Secondary | ICD-10-CM | POA: Diagnosis not present

## 2016-05-27 DIAGNOSIS — K760 Fatty (change of) liver, not elsewhere classified: Secondary | ICD-10-CM | POA: Diagnosis not present

## 2016-05-27 DIAGNOSIS — N186 End stage renal disease: Secondary | ICD-10-CM | POA: Diagnosis not present

## 2016-05-27 DIAGNOSIS — N2581 Secondary hyperparathyroidism of renal origin: Secondary | ICD-10-CM | POA: Diagnosis not present

## 2016-05-27 DIAGNOSIS — D631 Anemia in chronic kidney disease: Secondary | ICD-10-CM | POA: Diagnosis not present

## 2016-05-29 DIAGNOSIS — D509 Iron deficiency anemia, unspecified: Secondary | ICD-10-CM | POA: Diagnosis not present

## 2016-05-29 DIAGNOSIS — D631 Anemia in chronic kidney disease: Secondary | ICD-10-CM | POA: Diagnosis not present

## 2016-05-29 DIAGNOSIS — N186 End stage renal disease: Secondary | ICD-10-CM | POA: Diagnosis not present

## 2016-05-29 DIAGNOSIS — I2699 Other pulmonary embolism without acute cor pulmonale: Secondary | ICD-10-CM | POA: Diagnosis not present

## 2016-05-29 DIAGNOSIS — N2581 Secondary hyperparathyroidism of renal origin: Secondary | ICD-10-CM | POA: Diagnosis not present

## 2016-05-29 DIAGNOSIS — D696 Thrombocytopenia, unspecified: Secondary | ICD-10-CM | POA: Diagnosis not present

## 2016-05-29 DIAGNOSIS — K760 Fatty (change of) liver, not elsewhere classified: Secondary | ICD-10-CM | POA: Diagnosis not present

## 2016-05-31 DIAGNOSIS — D509 Iron deficiency anemia, unspecified: Secondary | ICD-10-CM | POA: Diagnosis not present

## 2016-05-31 DIAGNOSIS — N2581 Secondary hyperparathyroidism of renal origin: Secondary | ICD-10-CM | POA: Diagnosis not present

## 2016-05-31 DIAGNOSIS — D696 Thrombocytopenia, unspecified: Secondary | ICD-10-CM | POA: Diagnosis not present

## 2016-05-31 DIAGNOSIS — D631 Anemia in chronic kidney disease: Secondary | ICD-10-CM | POA: Diagnosis not present

## 2016-05-31 DIAGNOSIS — K760 Fatty (change of) liver, not elsewhere classified: Secondary | ICD-10-CM | POA: Diagnosis not present

## 2016-05-31 DIAGNOSIS — N186 End stage renal disease: Secondary | ICD-10-CM | POA: Diagnosis not present

## 2016-06-03 DIAGNOSIS — D509 Iron deficiency anemia, unspecified: Secondary | ICD-10-CM | POA: Diagnosis not present

## 2016-06-03 DIAGNOSIS — K760 Fatty (change of) liver, not elsewhere classified: Secondary | ICD-10-CM | POA: Diagnosis not present

## 2016-06-03 DIAGNOSIS — N2581 Secondary hyperparathyroidism of renal origin: Secondary | ICD-10-CM | POA: Diagnosis not present

## 2016-06-03 DIAGNOSIS — D631 Anemia in chronic kidney disease: Secondary | ICD-10-CM | POA: Diagnosis not present

## 2016-06-03 DIAGNOSIS — N186 End stage renal disease: Secondary | ICD-10-CM | POA: Diagnosis not present

## 2016-06-03 DIAGNOSIS — D696 Thrombocytopenia, unspecified: Secondary | ICD-10-CM | POA: Diagnosis not present

## 2016-06-05 DIAGNOSIS — D631 Anemia in chronic kidney disease: Secondary | ICD-10-CM | POA: Diagnosis not present

## 2016-06-05 DIAGNOSIS — D696 Thrombocytopenia, unspecified: Secondary | ICD-10-CM | POA: Diagnosis not present

## 2016-06-05 DIAGNOSIS — K760 Fatty (change of) liver, not elsewhere classified: Secondary | ICD-10-CM | POA: Diagnosis not present

## 2016-06-05 DIAGNOSIS — Q612 Polycystic kidney, adult type: Secondary | ICD-10-CM | POA: Diagnosis not present

## 2016-06-05 DIAGNOSIS — N186 End stage renal disease: Secondary | ICD-10-CM | POA: Diagnosis not present

## 2016-06-05 DIAGNOSIS — D509 Iron deficiency anemia, unspecified: Secondary | ICD-10-CM | POA: Diagnosis not present

## 2016-06-05 DIAGNOSIS — N2581 Secondary hyperparathyroidism of renal origin: Secondary | ICD-10-CM | POA: Diagnosis not present

## 2016-06-05 DIAGNOSIS — I2699 Other pulmonary embolism without acute cor pulmonale: Secondary | ICD-10-CM | POA: Diagnosis not present

## 2016-06-05 DIAGNOSIS — Z992 Dependence on renal dialysis: Secondary | ICD-10-CM | POA: Diagnosis not present

## 2016-06-07 DIAGNOSIS — D631 Anemia in chronic kidney disease: Secondary | ICD-10-CM | POA: Diagnosis not present

## 2016-06-07 DIAGNOSIS — D509 Iron deficiency anemia, unspecified: Secondary | ICD-10-CM | POA: Diagnosis not present

## 2016-06-07 DIAGNOSIS — N2581 Secondary hyperparathyroidism of renal origin: Secondary | ICD-10-CM | POA: Diagnosis not present

## 2016-06-07 DIAGNOSIS — N186 End stage renal disease: Secondary | ICD-10-CM | POA: Diagnosis not present

## 2016-06-07 DIAGNOSIS — K760 Fatty (change of) liver, not elsewhere classified: Secondary | ICD-10-CM | POA: Diagnosis not present

## 2016-06-10 DIAGNOSIS — D631 Anemia in chronic kidney disease: Secondary | ICD-10-CM | POA: Diagnosis not present

## 2016-06-10 DIAGNOSIS — D509 Iron deficiency anemia, unspecified: Secondary | ICD-10-CM | POA: Diagnosis not present

## 2016-06-10 DIAGNOSIS — K760 Fatty (change of) liver, not elsewhere classified: Secondary | ICD-10-CM | POA: Diagnosis not present

## 2016-06-10 DIAGNOSIS — N2581 Secondary hyperparathyroidism of renal origin: Secondary | ICD-10-CM | POA: Diagnosis not present

## 2016-06-10 DIAGNOSIS — N186 End stage renal disease: Secondary | ICD-10-CM | POA: Diagnosis not present

## 2016-06-12 DIAGNOSIS — I2699 Other pulmonary embolism without acute cor pulmonale: Secondary | ICD-10-CM | POA: Diagnosis not present

## 2016-06-12 DIAGNOSIS — K760 Fatty (change of) liver, not elsewhere classified: Secondary | ICD-10-CM | POA: Diagnosis not present

## 2016-06-12 DIAGNOSIS — N2581 Secondary hyperparathyroidism of renal origin: Secondary | ICD-10-CM | POA: Diagnosis not present

## 2016-06-12 DIAGNOSIS — D509 Iron deficiency anemia, unspecified: Secondary | ICD-10-CM | POA: Diagnosis not present

## 2016-06-12 DIAGNOSIS — D631 Anemia in chronic kidney disease: Secondary | ICD-10-CM | POA: Diagnosis not present

## 2016-06-12 DIAGNOSIS — N186 End stage renal disease: Secondary | ICD-10-CM | POA: Diagnosis not present

## 2016-06-14 DIAGNOSIS — N2581 Secondary hyperparathyroidism of renal origin: Secondary | ICD-10-CM | POA: Diagnosis not present

## 2016-06-14 DIAGNOSIS — D631 Anemia in chronic kidney disease: Secondary | ICD-10-CM | POA: Diagnosis not present

## 2016-06-14 DIAGNOSIS — N186 End stage renal disease: Secondary | ICD-10-CM | POA: Diagnosis not present

## 2016-06-14 DIAGNOSIS — K760 Fatty (change of) liver, not elsewhere classified: Secondary | ICD-10-CM | POA: Diagnosis not present

## 2016-06-14 DIAGNOSIS — D509 Iron deficiency anemia, unspecified: Secondary | ICD-10-CM | POA: Diagnosis not present

## 2016-06-17 DIAGNOSIS — D509 Iron deficiency anemia, unspecified: Secondary | ICD-10-CM | POA: Diagnosis not present

## 2016-06-17 DIAGNOSIS — N186 End stage renal disease: Secondary | ICD-10-CM | POA: Diagnosis not present

## 2016-06-17 DIAGNOSIS — K760 Fatty (change of) liver, not elsewhere classified: Secondary | ICD-10-CM | POA: Diagnosis not present

## 2016-06-17 DIAGNOSIS — N2581 Secondary hyperparathyroidism of renal origin: Secondary | ICD-10-CM | POA: Diagnosis not present

## 2016-06-17 DIAGNOSIS — D631 Anemia in chronic kidney disease: Secondary | ICD-10-CM | POA: Diagnosis not present

## 2016-06-19 DIAGNOSIS — N2581 Secondary hyperparathyroidism of renal origin: Secondary | ICD-10-CM | POA: Diagnosis not present

## 2016-06-19 DIAGNOSIS — D631 Anemia in chronic kidney disease: Secondary | ICD-10-CM | POA: Diagnosis not present

## 2016-06-19 DIAGNOSIS — K760 Fatty (change of) liver, not elsewhere classified: Secondary | ICD-10-CM | POA: Diagnosis not present

## 2016-06-19 DIAGNOSIS — I2699 Other pulmonary embolism without acute cor pulmonale: Secondary | ICD-10-CM | POA: Diagnosis not present

## 2016-06-19 DIAGNOSIS — N186 End stage renal disease: Secondary | ICD-10-CM | POA: Diagnosis not present

## 2016-06-19 DIAGNOSIS — D509 Iron deficiency anemia, unspecified: Secondary | ICD-10-CM | POA: Diagnosis not present

## 2016-06-21 DIAGNOSIS — N186 End stage renal disease: Secondary | ICD-10-CM | POA: Diagnosis not present

## 2016-06-21 DIAGNOSIS — K760 Fatty (change of) liver, not elsewhere classified: Secondary | ICD-10-CM | POA: Diagnosis not present

## 2016-06-21 DIAGNOSIS — N2581 Secondary hyperparathyroidism of renal origin: Secondary | ICD-10-CM | POA: Diagnosis not present

## 2016-06-21 DIAGNOSIS — D631 Anemia in chronic kidney disease: Secondary | ICD-10-CM | POA: Diagnosis not present

## 2016-06-21 DIAGNOSIS — D509 Iron deficiency anemia, unspecified: Secondary | ICD-10-CM | POA: Diagnosis not present

## 2016-06-24 DIAGNOSIS — N186 End stage renal disease: Secondary | ICD-10-CM | POA: Diagnosis not present

## 2016-06-24 DIAGNOSIS — N2581 Secondary hyperparathyroidism of renal origin: Secondary | ICD-10-CM | POA: Diagnosis not present

## 2016-06-24 DIAGNOSIS — D509 Iron deficiency anemia, unspecified: Secondary | ICD-10-CM | POA: Diagnosis not present

## 2016-06-24 DIAGNOSIS — D631 Anemia in chronic kidney disease: Secondary | ICD-10-CM | POA: Diagnosis not present

## 2016-06-24 DIAGNOSIS — K760 Fatty (change of) liver, not elsewhere classified: Secondary | ICD-10-CM | POA: Diagnosis not present

## 2016-06-26 DIAGNOSIS — D631 Anemia in chronic kidney disease: Secondary | ICD-10-CM | POA: Diagnosis not present

## 2016-06-26 DIAGNOSIS — D509 Iron deficiency anemia, unspecified: Secondary | ICD-10-CM | POA: Diagnosis not present

## 2016-06-26 DIAGNOSIS — N186 End stage renal disease: Secondary | ICD-10-CM | POA: Diagnosis not present

## 2016-06-26 DIAGNOSIS — I2699 Other pulmonary embolism without acute cor pulmonale: Secondary | ICD-10-CM | POA: Diagnosis not present

## 2016-06-26 DIAGNOSIS — K760 Fatty (change of) liver, not elsewhere classified: Secondary | ICD-10-CM | POA: Diagnosis not present

## 2016-06-26 DIAGNOSIS — N2581 Secondary hyperparathyroidism of renal origin: Secondary | ICD-10-CM | POA: Diagnosis not present

## 2016-06-28 DIAGNOSIS — D509 Iron deficiency anemia, unspecified: Secondary | ICD-10-CM | POA: Diagnosis not present

## 2016-06-28 DIAGNOSIS — D631 Anemia in chronic kidney disease: Secondary | ICD-10-CM | POA: Diagnosis not present

## 2016-06-28 DIAGNOSIS — K760 Fatty (change of) liver, not elsewhere classified: Secondary | ICD-10-CM | POA: Diagnosis not present

## 2016-06-28 DIAGNOSIS — N2581 Secondary hyperparathyroidism of renal origin: Secondary | ICD-10-CM | POA: Diagnosis not present

## 2016-06-28 DIAGNOSIS — N186 End stage renal disease: Secondary | ICD-10-CM | POA: Diagnosis not present

## 2016-07-01 DIAGNOSIS — N186 End stage renal disease: Secondary | ICD-10-CM | POA: Diagnosis not present

## 2016-07-01 DIAGNOSIS — K760 Fatty (change of) liver, not elsewhere classified: Secondary | ICD-10-CM | POA: Diagnosis not present

## 2016-07-01 DIAGNOSIS — D631 Anemia in chronic kidney disease: Secondary | ICD-10-CM | POA: Diagnosis not present

## 2016-07-01 DIAGNOSIS — N2581 Secondary hyperparathyroidism of renal origin: Secondary | ICD-10-CM | POA: Diagnosis not present

## 2016-07-01 DIAGNOSIS — D509 Iron deficiency anemia, unspecified: Secondary | ICD-10-CM | POA: Diagnosis not present

## 2016-07-03 DIAGNOSIS — N186 End stage renal disease: Secondary | ICD-10-CM | POA: Diagnosis not present

## 2016-07-03 DIAGNOSIS — D631 Anemia in chronic kidney disease: Secondary | ICD-10-CM | POA: Diagnosis not present

## 2016-07-03 DIAGNOSIS — Q612 Polycystic kidney, adult type: Secondary | ICD-10-CM | POA: Diagnosis not present

## 2016-07-03 DIAGNOSIS — K760 Fatty (change of) liver, not elsewhere classified: Secondary | ICD-10-CM | POA: Diagnosis not present

## 2016-07-03 DIAGNOSIS — Z992 Dependence on renal dialysis: Secondary | ICD-10-CM | POA: Diagnosis not present

## 2016-07-03 DIAGNOSIS — D509 Iron deficiency anemia, unspecified: Secondary | ICD-10-CM | POA: Diagnosis not present

## 2016-07-03 DIAGNOSIS — I2699 Other pulmonary embolism without acute cor pulmonale: Secondary | ICD-10-CM | POA: Diagnosis not present

## 2016-07-03 DIAGNOSIS — N2581 Secondary hyperparathyroidism of renal origin: Secondary | ICD-10-CM | POA: Diagnosis not present

## 2016-07-05 DIAGNOSIS — D631 Anemia in chronic kidney disease: Secondary | ICD-10-CM | POA: Diagnosis not present

## 2016-07-05 DIAGNOSIS — N186 End stage renal disease: Secondary | ICD-10-CM | POA: Diagnosis not present

## 2016-07-05 DIAGNOSIS — N2581 Secondary hyperparathyroidism of renal origin: Secondary | ICD-10-CM | POA: Diagnosis not present

## 2016-07-08 DIAGNOSIS — D631 Anemia in chronic kidney disease: Secondary | ICD-10-CM | POA: Diagnosis not present

## 2016-07-08 DIAGNOSIS — N186 End stage renal disease: Secondary | ICD-10-CM | POA: Diagnosis not present

## 2016-07-08 DIAGNOSIS — N2581 Secondary hyperparathyroidism of renal origin: Secondary | ICD-10-CM | POA: Diagnosis not present

## 2016-07-10 DIAGNOSIS — I2699 Other pulmonary embolism without acute cor pulmonale: Secondary | ICD-10-CM | POA: Diagnosis not present

## 2016-07-10 DIAGNOSIS — N186 End stage renal disease: Secondary | ICD-10-CM | POA: Diagnosis not present

## 2016-07-10 DIAGNOSIS — D631 Anemia in chronic kidney disease: Secondary | ICD-10-CM | POA: Diagnosis not present

## 2016-07-10 DIAGNOSIS — N2581 Secondary hyperparathyroidism of renal origin: Secondary | ICD-10-CM | POA: Diagnosis not present

## 2016-07-12 DIAGNOSIS — N186 End stage renal disease: Secondary | ICD-10-CM | POA: Diagnosis not present

## 2016-07-12 DIAGNOSIS — N2581 Secondary hyperparathyroidism of renal origin: Secondary | ICD-10-CM | POA: Diagnosis not present

## 2016-07-12 DIAGNOSIS — D631 Anemia in chronic kidney disease: Secondary | ICD-10-CM | POA: Diagnosis not present

## 2016-07-15 DIAGNOSIS — N2581 Secondary hyperparathyroidism of renal origin: Secondary | ICD-10-CM | POA: Diagnosis not present

## 2016-07-15 DIAGNOSIS — N186 End stage renal disease: Secondary | ICD-10-CM | POA: Diagnosis not present

## 2016-07-15 DIAGNOSIS — D631 Anemia in chronic kidney disease: Secondary | ICD-10-CM | POA: Diagnosis not present

## 2016-07-19 DIAGNOSIS — N186 End stage renal disease: Secondary | ICD-10-CM | POA: Diagnosis not present

## 2016-07-19 DIAGNOSIS — D631 Anemia in chronic kidney disease: Secondary | ICD-10-CM | POA: Diagnosis not present

## 2016-07-19 DIAGNOSIS — N2581 Secondary hyperparathyroidism of renal origin: Secondary | ICD-10-CM | POA: Diagnosis not present

## 2016-07-22 DIAGNOSIS — N186 End stage renal disease: Secondary | ICD-10-CM | POA: Diagnosis not present

## 2016-07-22 DIAGNOSIS — N2581 Secondary hyperparathyroidism of renal origin: Secondary | ICD-10-CM | POA: Diagnosis not present

## 2016-07-22 DIAGNOSIS — D631 Anemia in chronic kidney disease: Secondary | ICD-10-CM | POA: Diagnosis not present

## 2016-07-22 DIAGNOSIS — D689 Coagulation defect, unspecified: Secondary | ICD-10-CM | POA: Diagnosis not present

## 2016-07-24 DIAGNOSIS — N186 End stage renal disease: Secondary | ICD-10-CM | POA: Diagnosis not present

## 2016-07-24 DIAGNOSIS — N2581 Secondary hyperparathyroidism of renal origin: Secondary | ICD-10-CM | POA: Diagnosis not present

## 2016-07-24 DIAGNOSIS — D631 Anemia in chronic kidney disease: Secondary | ICD-10-CM | POA: Diagnosis not present

## 2016-07-26 DIAGNOSIS — D631 Anemia in chronic kidney disease: Secondary | ICD-10-CM | POA: Diagnosis not present

## 2016-07-26 DIAGNOSIS — N2581 Secondary hyperparathyroidism of renal origin: Secondary | ICD-10-CM | POA: Diagnosis not present

## 2016-07-26 DIAGNOSIS — N186 End stage renal disease: Secondary | ICD-10-CM | POA: Diagnosis not present

## 2016-07-29 DIAGNOSIS — N2581 Secondary hyperparathyroidism of renal origin: Secondary | ICD-10-CM | POA: Diagnosis not present

## 2016-07-29 DIAGNOSIS — N186 End stage renal disease: Secondary | ICD-10-CM | POA: Diagnosis not present

## 2016-07-29 DIAGNOSIS — D631 Anemia in chronic kidney disease: Secondary | ICD-10-CM | POA: Diagnosis not present

## 2016-07-31 DIAGNOSIS — N186 End stage renal disease: Secondary | ICD-10-CM | POA: Diagnosis not present

## 2016-07-31 DIAGNOSIS — I2699 Other pulmonary embolism without acute cor pulmonale: Secondary | ICD-10-CM | POA: Diagnosis not present

## 2016-07-31 DIAGNOSIS — N2581 Secondary hyperparathyroidism of renal origin: Secondary | ICD-10-CM | POA: Diagnosis not present

## 2016-07-31 DIAGNOSIS — D631 Anemia in chronic kidney disease: Secondary | ICD-10-CM | POA: Diagnosis not present

## 2016-08-02 DIAGNOSIS — D631 Anemia in chronic kidney disease: Secondary | ICD-10-CM | POA: Diagnosis not present

## 2016-08-02 DIAGNOSIS — N2581 Secondary hyperparathyroidism of renal origin: Secondary | ICD-10-CM | POA: Diagnosis not present

## 2016-08-02 DIAGNOSIS — N186 End stage renal disease: Secondary | ICD-10-CM | POA: Diagnosis not present

## 2016-08-03 DIAGNOSIS — N186 End stage renal disease: Secondary | ICD-10-CM | POA: Diagnosis not present

## 2016-08-03 DIAGNOSIS — Z992 Dependence on renal dialysis: Secondary | ICD-10-CM | POA: Diagnosis not present

## 2016-08-03 DIAGNOSIS — Q612 Polycystic kidney, adult type: Secondary | ICD-10-CM | POA: Diagnosis not present

## 2016-08-05 DIAGNOSIS — N186 End stage renal disease: Secondary | ICD-10-CM | POA: Diagnosis not present

## 2016-08-05 DIAGNOSIS — N2581 Secondary hyperparathyroidism of renal origin: Secondary | ICD-10-CM | POA: Diagnosis not present

## 2016-08-05 DIAGNOSIS — K760 Fatty (change of) liver, not elsewhere classified: Secondary | ICD-10-CM | POA: Diagnosis not present

## 2016-08-05 DIAGNOSIS — D509 Iron deficiency anemia, unspecified: Secondary | ICD-10-CM | POA: Diagnosis not present

## 2016-08-07 DIAGNOSIS — N2581 Secondary hyperparathyroidism of renal origin: Secondary | ICD-10-CM | POA: Diagnosis not present

## 2016-08-07 DIAGNOSIS — D509 Iron deficiency anemia, unspecified: Secondary | ICD-10-CM | POA: Diagnosis not present

## 2016-08-07 DIAGNOSIS — N186 End stage renal disease: Secondary | ICD-10-CM | POA: Diagnosis not present

## 2016-08-07 DIAGNOSIS — I2699 Other pulmonary embolism without acute cor pulmonale: Secondary | ICD-10-CM | POA: Diagnosis not present

## 2016-08-07 DIAGNOSIS — K760 Fatty (change of) liver, not elsewhere classified: Secondary | ICD-10-CM | POA: Diagnosis not present

## 2016-08-09 DIAGNOSIS — N186 End stage renal disease: Secondary | ICD-10-CM | POA: Diagnosis not present

## 2016-08-09 DIAGNOSIS — D509 Iron deficiency anemia, unspecified: Secondary | ICD-10-CM | POA: Diagnosis not present

## 2016-08-09 DIAGNOSIS — K760 Fatty (change of) liver, not elsewhere classified: Secondary | ICD-10-CM | POA: Diagnosis not present

## 2016-08-09 DIAGNOSIS — N2581 Secondary hyperparathyroidism of renal origin: Secondary | ICD-10-CM | POA: Diagnosis not present

## 2016-08-12 DIAGNOSIS — N186 End stage renal disease: Secondary | ICD-10-CM | POA: Diagnosis not present

## 2016-08-12 DIAGNOSIS — N2581 Secondary hyperparathyroidism of renal origin: Secondary | ICD-10-CM | POA: Diagnosis not present

## 2016-08-12 DIAGNOSIS — D509 Iron deficiency anemia, unspecified: Secondary | ICD-10-CM | POA: Diagnosis not present

## 2016-08-12 DIAGNOSIS — K760 Fatty (change of) liver, not elsewhere classified: Secondary | ICD-10-CM | POA: Diagnosis not present

## 2016-08-14 DIAGNOSIS — N186 End stage renal disease: Secondary | ICD-10-CM | POA: Diagnosis not present

## 2016-08-14 DIAGNOSIS — I2699 Other pulmonary embolism without acute cor pulmonale: Secondary | ICD-10-CM | POA: Diagnosis not present

## 2016-08-14 DIAGNOSIS — K760 Fatty (change of) liver, not elsewhere classified: Secondary | ICD-10-CM | POA: Diagnosis not present

## 2016-08-14 DIAGNOSIS — D509 Iron deficiency anemia, unspecified: Secondary | ICD-10-CM | POA: Diagnosis not present

## 2016-08-14 DIAGNOSIS — N2581 Secondary hyperparathyroidism of renal origin: Secondary | ICD-10-CM | POA: Diagnosis not present

## 2016-08-16 DIAGNOSIS — D509 Iron deficiency anemia, unspecified: Secondary | ICD-10-CM | POA: Diagnosis not present

## 2016-08-16 DIAGNOSIS — K760 Fatty (change of) liver, not elsewhere classified: Secondary | ICD-10-CM | POA: Diagnosis not present

## 2016-08-16 DIAGNOSIS — N186 End stage renal disease: Secondary | ICD-10-CM | POA: Diagnosis not present

## 2016-08-16 DIAGNOSIS — N2581 Secondary hyperparathyroidism of renal origin: Secondary | ICD-10-CM | POA: Diagnosis not present

## 2016-08-19 DIAGNOSIS — K760 Fatty (change of) liver, not elsewhere classified: Secondary | ICD-10-CM | POA: Diagnosis not present

## 2016-08-19 DIAGNOSIS — N2581 Secondary hyperparathyroidism of renal origin: Secondary | ICD-10-CM | POA: Diagnosis not present

## 2016-08-19 DIAGNOSIS — N186 End stage renal disease: Secondary | ICD-10-CM | POA: Diagnosis not present

## 2016-08-19 DIAGNOSIS — D509 Iron deficiency anemia, unspecified: Secondary | ICD-10-CM | POA: Diagnosis not present

## 2016-08-21 DIAGNOSIS — N186 End stage renal disease: Secondary | ICD-10-CM | POA: Diagnosis not present

## 2016-08-21 DIAGNOSIS — N2581 Secondary hyperparathyroidism of renal origin: Secondary | ICD-10-CM | POA: Diagnosis not present

## 2016-08-21 DIAGNOSIS — K760 Fatty (change of) liver, not elsewhere classified: Secondary | ICD-10-CM | POA: Diagnosis not present

## 2016-08-21 DIAGNOSIS — D509 Iron deficiency anemia, unspecified: Secondary | ICD-10-CM | POA: Diagnosis not present

## 2016-08-21 DIAGNOSIS — I2699 Other pulmonary embolism without acute cor pulmonale: Secondary | ICD-10-CM | POA: Diagnosis not present

## 2016-08-23 DIAGNOSIS — N186 End stage renal disease: Secondary | ICD-10-CM | POA: Diagnosis not present

## 2016-08-26 DIAGNOSIS — D509 Iron deficiency anemia, unspecified: Secondary | ICD-10-CM | POA: Diagnosis not present

## 2016-08-26 DIAGNOSIS — N2581 Secondary hyperparathyroidism of renal origin: Secondary | ICD-10-CM | POA: Diagnosis not present

## 2016-08-26 DIAGNOSIS — N186 End stage renal disease: Secondary | ICD-10-CM | POA: Diagnosis not present

## 2016-08-26 DIAGNOSIS — K760 Fatty (change of) liver, not elsewhere classified: Secondary | ICD-10-CM | POA: Diagnosis not present

## 2016-08-30 DIAGNOSIS — K760 Fatty (change of) liver, not elsewhere classified: Secondary | ICD-10-CM | POA: Diagnosis not present

## 2016-08-30 DIAGNOSIS — N186 End stage renal disease: Secondary | ICD-10-CM | POA: Diagnosis not present

## 2016-08-30 DIAGNOSIS — N2581 Secondary hyperparathyroidism of renal origin: Secondary | ICD-10-CM | POA: Diagnosis not present

## 2016-08-30 DIAGNOSIS — D509 Iron deficiency anemia, unspecified: Secondary | ICD-10-CM | POA: Diagnosis not present

## 2016-09-02 DIAGNOSIS — N2581 Secondary hyperparathyroidism of renal origin: Secondary | ICD-10-CM | POA: Diagnosis not present

## 2016-09-02 DIAGNOSIS — Z992 Dependence on renal dialysis: Secondary | ICD-10-CM | POA: Diagnosis not present

## 2016-09-02 DIAGNOSIS — N186 End stage renal disease: Secondary | ICD-10-CM | POA: Diagnosis not present

## 2016-09-02 DIAGNOSIS — K760 Fatty (change of) liver, not elsewhere classified: Secondary | ICD-10-CM | POA: Diagnosis not present

## 2016-09-02 DIAGNOSIS — Q612 Polycystic kidney, adult type: Secondary | ICD-10-CM | POA: Diagnosis not present

## 2016-09-02 DIAGNOSIS — D509 Iron deficiency anemia, unspecified: Secondary | ICD-10-CM | POA: Diagnosis not present

## 2016-09-04 DIAGNOSIS — D631 Anemia in chronic kidney disease: Secondary | ICD-10-CM | POA: Diagnosis not present

## 2016-09-04 DIAGNOSIS — N2581 Secondary hyperparathyroidism of renal origin: Secondary | ICD-10-CM | POA: Diagnosis not present

## 2016-09-04 DIAGNOSIS — I2699 Other pulmonary embolism without acute cor pulmonale: Secondary | ICD-10-CM | POA: Diagnosis not present

## 2016-09-04 DIAGNOSIS — N186 End stage renal disease: Secondary | ICD-10-CM | POA: Diagnosis not present

## 2016-09-04 DIAGNOSIS — D509 Iron deficiency anemia, unspecified: Secondary | ICD-10-CM | POA: Diagnosis not present

## 2016-09-04 DIAGNOSIS — K760 Fatty (change of) liver, not elsewhere classified: Secondary | ICD-10-CM | POA: Diagnosis not present

## 2016-09-06 DIAGNOSIS — D631 Anemia in chronic kidney disease: Secondary | ICD-10-CM | POA: Diagnosis not present

## 2016-09-06 DIAGNOSIS — D509 Iron deficiency anemia, unspecified: Secondary | ICD-10-CM | POA: Diagnosis not present

## 2016-09-06 DIAGNOSIS — K760 Fatty (change of) liver, not elsewhere classified: Secondary | ICD-10-CM | POA: Diagnosis not present

## 2016-09-06 DIAGNOSIS — N2581 Secondary hyperparathyroidism of renal origin: Secondary | ICD-10-CM | POA: Diagnosis not present

## 2016-09-06 DIAGNOSIS — N186 End stage renal disease: Secondary | ICD-10-CM | POA: Diagnosis not present

## 2016-09-09 DIAGNOSIS — D509 Iron deficiency anemia, unspecified: Secondary | ICD-10-CM | POA: Diagnosis not present

## 2016-09-09 DIAGNOSIS — K760 Fatty (change of) liver, not elsewhere classified: Secondary | ICD-10-CM | POA: Diagnosis not present

## 2016-09-09 DIAGNOSIS — N2581 Secondary hyperparathyroidism of renal origin: Secondary | ICD-10-CM | POA: Diagnosis not present

## 2016-09-09 DIAGNOSIS — D631 Anemia in chronic kidney disease: Secondary | ICD-10-CM | POA: Diagnosis not present

## 2016-09-09 DIAGNOSIS — N186 End stage renal disease: Secondary | ICD-10-CM | POA: Diagnosis not present

## 2016-09-11 DIAGNOSIS — K760 Fatty (change of) liver, not elsewhere classified: Secondary | ICD-10-CM | POA: Diagnosis not present

## 2016-09-11 DIAGNOSIS — D631 Anemia in chronic kidney disease: Secondary | ICD-10-CM | POA: Diagnosis not present

## 2016-09-11 DIAGNOSIS — D509 Iron deficiency anemia, unspecified: Secondary | ICD-10-CM | POA: Diagnosis not present

## 2016-09-11 DIAGNOSIS — N186 End stage renal disease: Secondary | ICD-10-CM | POA: Diagnosis not present

## 2016-09-11 DIAGNOSIS — N2581 Secondary hyperparathyroidism of renal origin: Secondary | ICD-10-CM | POA: Diagnosis not present

## 2016-09-13 DIAGNOSIS — D631 Anemia in chronic kidney disease: Secondary | ICD-10-CM | POA: Diagnosis not present

## 2016-09-13 DIAGNOSIS — D509 Iron deficiency anemia, unspecified: Secondary | ICD-10-CM | POA: Diagnosis not present

## 2016-09-13 DIAGNOSIS — K760 Fatty (change of) liver, not elsewhere classified: Secondary | ICD-10-CM | POA: Diagnosis not present

## 2016-09-13 DIAGNOSIS — N2581 Secondary hyperparathyroidism of renal origin: Secondary | ICD-10-CM | POA: Diagnosis not present

## 2016-09-13 DIAGNOSIS — N186 End stage renal disease: Secondary | ICD-10-CM | POA: Diagnosis not present

## 2016-09-16 DIAGNOSIS — N2581 Secondary hyperparathyroidism of renal origin: Secondary | ICD-10-CM | POA: Diagnosis not present

## 2016-09-16 DIAGNOSIS — N186 End stage renal disease: Secondary | ICD-10-CM | POA: Diagnosis not present

## 2016-09-18 DIAGNOSIS — D509 Iron deficiency anemia, unspecified: Secondary | ICD-10-CM | POA: Diagnosis not present

## 2016-09-18 DIAGNOSIS — N2581 Secondary hyperparathyroidism of renal origin: Secondary | ICD-10-CM | POA: Diagnosis not present

## 2016-09-18 DIAGNOSIS — N186 End stage renal disease: Secondary | ICD-10-CM | POA: Diagnosis not present

## 2016-09-18 DIAGNOSIS — D631 Anemia in chronic kidney disease: Secondary | ICD-10-CM | POA: Diagnosis not present

## 2016-09-18 DIAGNOSIS — I2699 Other pulmonary embolism without acute cor pulmonale: Secondary | ICD-10-CM | POA: Diagnosis not present

## 2016-09-18 DIAGNOSIS — K760 Fatty (change of) liver, not elsewhere classified: Secondary | ICD-10-CM | POA: Diagnosis not present

## 2016-09-20 DIAGNOSIS — D631 Anemia in chronic kidney disease: Secondary | ICD-10-CM | POA: Diagnosis not present

## 2016-09-20 DIAGNOSIS — K760 Fatty (change of) liver, not elsewhere classified: Secondary | ICD-10-CM | POA: Diagnosis not present

## 2016-09-20 DIAGNOSIS — N2581 Secondary hyperparathyroidism of renal origin: Secondary | ICD-10-CM | POA: Diagnosis not present

## 2016-09-20 DIAGNOSIS — N186 End stage renal disease: Secondary | ICD-10-CM | POA: Diagnosis not present

## 2016-09-20 DIAGNOSIS — D509 Iron deficiency anemia, unspecified: Secondary | ICD-10-CM | POA: Diagnosis not present

## 2016-09-23 DIAGNOSIS — N2581 Secondary hyperparathyroidism of renal origin: Secondary | ICD-10-CM | POA: Diagnosis not present

## 2016-09-23 DIAGNOSIS — N186 End stage renal disease: Secondary | ICD-10-CM | POA: Diagnosis not present

## 2016-09-23 DIAGNOSIS — K760 Fatty (change of) liver, not elsewhere classified: Secondary | ICD-10-CM | POA: Diagnosis not present

## 2016-09-23 DIAGNOSIS — D509 Iron deficiency anemia, unspecified: Secondary | ICD-10-CM | POA: Diagnosis not present

## 2016-09-23 DIAGNOSIS — D631 Anemia in chronic kidney disease: Secondary | ICD-10-CM | POA: Diagnosis not present

## 2016-09-25 DIAGNOSIS — I2699 Other pulmonary embolism without acute cor pulmonale: Secondary | ICD-10-CM | POA: Diagnosis not present

## 2016-09-25 DIAGNOSIS — D631 Anemia in chronic kidney disease: Secondary | ICD-10-CM | POA: Diagnosis not present

## 2016-09-25 DIAGNOSIS — N186 End stage renal disease: Secondary | ICD-10-CM | POA: Diagnosis not present

## 2016-09-25 DIAGNOSIS — N2581 Secondary hyperparathyroidism of renal origin: Secondary | ICD-10-CM | POA: Diagnosis not present

## 2016-09-25 DIAGNOSIS — K760 Fatty (change of) liver, not elsewhere classified: Secondary | ICD-10-CM | POA: Diagnosis not present

## 2016-09-25 DIAGNOSIS — D509 Iron deficiency anemia, unspecified: Secondary | ICD-10-CM | POA: Diagnosis not present

## 2016-09-27 DIAGNOSIS — N186 End stage renal disease: Secondary | ICD-10-CM | POA: Diagnosis not present

## 2016-09-27 DIAGNOSIS — D631 Anemia in chronic kidney disease: Secondary | ICD-10-CM | POA: Diagnosis not present

## 2016-09-27 DIAGNOSIS — N2581 Secondary hyperparathyroidism of renal origin: Secondary | ICD-10-CM | POA: Diagnosis not present

## 2016-09-27 DIAGNOSIS — K760 Fatty (change of) liver, not elsewhere classified: Secondary | ICD-10-CM | POA: Diagnosis not present

## 2016-09-27 DIAGNOSIS — D509 Iron deficiency anemia, unspecified: Secondary | ICD-10-CM | POA: Diagnosis not present

## 2016-09-30 DIAGNOSIS — D509 Iron deficiency anemia, unspecified: Secondary | ICD-10-CM | POA: Diagnosis not present

## 2016-09-30 DIAGNOSIS — D631 Anemia in chronic kidney disease: Secondary | ICD-10-CM | POA: Diagnosis not present

## 2016-09-30 DIAGNOSIS — K760 Fatty (change of) liver, not elsewhere classified: Secondary | ICD-10-CM | POA: Diagnosis not present

## 2016-09-30 DIAGNOSIS — N2581 Secondary hyperparathyroidism of renal origin: Secondary | ICD-10-CM | POA: Diagnosis not present

## 2016-09-30 DIAGNOSIS — N186 End stage renal disease: Secondary | ICD-10-CM | POA: Diagnosis not present

## 2016-10-02 DIAGNOSIS — N2581 Secondary hyperparathyroidism of renal origin: Secondary | ICD-10-CM | POA: Diagnosis not present

## 2016-10-02 DIAGNOSIS — D509 Iron deficiency anemia, unspecified: Secondary | ICD-10-CM | POA: Diagnosis not present

## 2016-10-02 DIAGNOSIS — I2699 Other pulmonary embolism without acute cor pulmonale: Secondary | ICD-10-CM | POA: Diagnosis not present

## 2016-10-02 DIAGNOSIS — N186 End stage renal disease: Secondary | ICD-10-CM | POA: Diagnosis not present

## 2016-10-02 DIAGNOSIS — D631 Anemia in chronic kidney disease: Secondary | ICD-10-CM | POA: Diagnosis not present

## 2016-10-02 DIAGNOSIS — K760 Fatty (change of) liver, not elsewhere classified: Secondary | ICD-10-CM | POA: Diagnosis not present

## 2016-10-03 DIAGNOSIS — N186 End stage renal disease: Secondary | ICD-10-CM | POA: Diagnosis not present

## 2016-10-03 DIAGNOSIS — Z992 Dependence on renal dialysis: Secondary | ICD-10-CM | POA: Diagnosis not present

## 2016-10-03 DIAGNOSIS — Q612 Polycystic kidney, adult type: Secondary | ICD-10-CM | POA: Diagnosis not present

## 2016-10-04 DIAGNOSIS — D509 Iron deficiency anemia, unspecified: Secondary | ICD-10-CM | POA: Diagnosis not present

## 2016-10-04 DIAGNOSIS — D631 Anemia in chronic kidney disease: Secondary | ICD-10-CM | POA: Diagnosis not present

## 2016-10-04 DIAGNOSIS — N2581 Secondary hyperparathyroidism of renal origin: Secondary | ICD-10-CM | POA: Diagnosis not present

## 2016-10-04 DIAGNOSIS — K760 Fatty (change of) liver, not elsewhere classified: Secondary | ICD-10-CM | POA: Diagnosis not present

## 2016-10-04 DIAGNOSIS — N186 End stage renal disease: Secondary | ICD-10-CM | POA: Diagnosis not present

## 2016-10-07 DIAGNOSIS — D509 Iron deficiency anemia, unspecified: Secondary | ICD-10-CM | POA: Diagnosis not present

## 2016-10-07 DIAGNOSIS — N186 End stage renal disease: Secondary | ICD-10-CM | POA: Diagnosis not present

## 2016-10-07 DIAGNOSIS — N2581 Secondary hyperparathyroidism of renal origin: Secondary | ICD-10-CM | POA: Diagnosis not present

## 2016-10-07 DIAGNOSIS — D631 Anemia in chronic kidney disease: Secondary | ICD-10-CM | POA: Diagnosis not present

## 2016-10-07 DIAGNOSIS — K760 Fatty (change of) liver, not elsewhere classified: Secondary | ICD-10-CM | POA: Diagnosis not present

## 2016-10-09 DIAGNOSIS — I2699 Other pulmonary embolism without acute cor pulmonale: Secondary | ICD-10-CM | POA: Diagnosis not present

## 2016-10-09 DIAGNOSIS — N186 End stage renal disease: Secondary | ICD-10-CM | POA: Diagnosis not present

## 2016-10-09 DIAGNOSIS — D631 Anemia in chronic kidney disease: Secondary | ICD-10-CM | POA: Diagnosis not present

## 2016-10-09 DIAGNOSIS — D509 Iron deficiency anemia, unspecified: Secondary | ICD-10-CM | POA: Diagnosis not present

## 2016-10-09 DIAGNOSIS — N2581 Secondary hyperparathyroidism of renal origin: Secondary | ICD-10-CM | POA: Diagnosis not present

## 2016-10-09 DIAGNOSIS — K760 Fatty (change of) liver, not elsewhere classified: Secondary | ICD-10-CM | POA: Diagnosis not present

## 2016-10-11 DIAGNOSIS — N186 End stage renal disease: Secondary | ICD-10-CM | POA: Diagnosis not present

## 2016-10-11 DIAGNOSIS — K760 Fatty (change of) liver, not elsewhere classified: Secondary | ICD-10-CM | POA: Diagnosis not present

## 2016-10-11 DIAGNOSIS — D509 Iron deficiency anemia, unspecified: Secondary | ICD-10-CM | POA: Diagnosis not present

## 2016-10-11 DIAGNOSIS — N2581 Secondary hyperparathyroidism of renal origin: Secondary | ICD-10-CM | POA: Diagnosis not present

## 2016-10-11 DIAGNOSIS — D631 Anemia in chronic kidney disease: Secondary | ICD-10-CM | POA: Diagnosis not present

## 2016-10-14 DIAGNOSIS — N2581 Secondary hyperparathyroidism of renal origin: Secondary | ICD-10-CM | POA: Diagnosis not present

## 2016-10-14 DIAGNOSIS — K760 Fatty (change of) liver, not elsewhere classified: Secondary | ICD-10-CM | POA: Diagnosis not present

## 2016-10-14 DIAGNOSIS — N186 End stage renal disease: Secondary | ICD-10-CM | POA: Diagnosis not present

## 2016-10-14 DIAGNOSIS — D509 Iron deficiency anemia, unspecified: Secondary | ICD-10-CM | POA: Diagnosis not present

## 2016-10-14 DIAGNOSIS — D631 Anemia in chronic kidney disease: Secondary | ICD-10-CM | POA: Diagnosis not present

## 2016-10-16 DIAGNOSIS — D509 Iron deficiency anemia, unspecified: Secondary | ICD-10-CM | POA: Diagnosis not present

## 2016-10-16 DIAGNOSIS — D631 Anemia in chronic kidney disease: Secondary | ICD-10-CM | POA: Diagnosis not present

## 2016-10-16 DIAGNOSIS — N2581 Secondary hyperparathyroidism of renal origin: Secondary | ICD-10-CM | POA: Diagnosis not present

## 2016-10-16 DIAGNOSIS — N186 End stage renal disease: Secondary | ICD-10-CM | POA: Diagnosis not present

## 2016-10-16 DIAGNOSIS — I2699 Other pulmonary embolism without acute cor pulmonale: Secondary | ICD-10-CM | POA: Diagnosis not present

## 2016-10-16 DIAGNOSIS — K760 Fatty (change of) liver, not elsewhere classified: Secondary | ICD-10-CM | POA: Diagnosis not present

## 2016-10-18 DIAGNOSIS — D509 Iron deficiency anemia, unspecified: Secondary | ICD-10-CM | POA: Diagnosis not present

## 2016-10-18 DIAGNOSIS — K760 Fatty (change of) liver, not elsewhere classified: Secondary | ICD-10-CM | POA: Diagnosis not present

## 2016-10-18 DIAGNOSIS — N186 End stage renal disease: Secondary | ICD-10-CM | POA: Diagnosis not present

## 2016-10-18 DIAGNOSIS — D631 Anemia in chronic kidney disease: Secondary | ICD-10-CM | POA: Diagnosis not present

## 2016-10-18 DIAGNOSIS — N2581 Secondary hyperparathyroidism of renal origin: Secondary | ICD-10-CM | POA: Diagnosis not present

## 2016-10-21 DIAGNOSIS — N2581 Secondary hyperparathyroidism of renal origin: Secondary | ICD-10-CM | POA: Diagnosis not present

## 2016-10-21 DIAGNOSIS — D509 Iron deficiency anemia, unspecified: Secondary | ICD-10-CM | POA: Diagnosis not present

## 2016-10-21 DIAGNOSIS — K760 Fatty (change of) liver, not elsewhere classified: Secondary | ICD-10-CM | POA: Diagnosis not present

## 2016-10-21 DIAGNOSIS — N186 End stage renal disease: Secondary | ICD-10-CM | POA: Diagnosis not present

## 2016-10-21 DIAGNOSIS — D631 Anemia in chronic kidney disease: Secondary | ICD-10-CM | POA: Diagnosis not present

## 2016-10-23 DIAGNOSIS — N186 End stage renal disease: Secondary | ICD-10-CM | POA: Diagnosis not present

## 2016-10-23 DIAGNOSIS — D631 Anemia in chronic kidney disease: Secondary | ICD-10-CM | POA: Diagnosis not present

## 2016-10-23 DIAGNOSIS — N2581 Secondary hyperparathyroidism of renal origin: Secondary | ICD-10-CM | POA: Diagnosis not present

## 2016-10-23 DIAGNOSIS — K760 Fatty (change of) liver, not elsewhere classified: Secondary | ICD-10-CM | POA: Diagnosis not present

## 2016-10-23 DIAGNOSIS — I2699 Other pulmonary embolism without acute cor pulmonale: Secondary | ICD-10-CM | POA: Diagnosis not present

## 2016-10-23 DIAGNOSIS — D509 Iron deficiency anemia, unspecified: Secondary | ICD-10-CM | POA: Diagnosis not present

## 2016-10-25 DIAGNOSIS — N2581 Secondary hyperparathyroidism of renal origin: Secondary | ICD-10-CM | POA: Diagnosis not present

## 2016-10-25 DIAGNOSIS — D631 Anemia in chronic kidney disease: Secondary | ICD-10-CM | POA: Diagnosis not present

## 2016-10-25 DIAGNOSIS — N186 End stage renal disease: Secondary | ICD-10-CM | POA: Diagnosis not present

## 2016-10-25 DIAGNOSIS — D509 Iron deficiency anemia, unspecified: Secondary | ICD-10-CM | POA: Diagnosis not present

## 2016-10-25 DIAGNOSIS — K760 Fatty (change of) liver, not elsewhere classified: Secondary | ICD-10-CM | POA: Diagnosis not present

## 2016-10-28 DIAGNOSIS — N186 End stage renal disease: Secondary | ICD-10-CM | POA: Diagnosis not present

## 2016-10-28 DIAGNOSIS — D509 Iron deficiency anemia, unspecified: Secondary | ICD-10-CM | POA: Diagnosis not present

## 2016-10-28 DIAGNOSIS — N2581 Secondary hyperparathyroidism of renal origin: Secondary | ICD-10-CM | POA: Diagnosis not present

## 2016-10-28 DIAGNOSIS — D631 Anemia in chronic kidney disease: Secondary | ICD-10-CM | POA: Diagnosis not present

## 2016-10-28 DIAGNOSIS — K760 Fatty (change of) liver, not elsewhere classified: Secondary | ICD-10-CM | POA: Diagnosis not present

## 2016-10-30 DIAGNOSIS — D509 Iron deficiency anemia, unspecified: Secondary | ICD-10-CM | POA: Diagnosis not present

## 2016-10-30 DIAGNOSIS — N2581 Secondary hyperparathyroidism of renal origin: Secondary | ICD-10-CM | POA: Diagnosis not present

## 2016-10-30 DIAGNOSIS — N186 End stage renal disease: Secondary | ICD-10-CM | POA: Diagnosis not present

## 2016-10-30 DIAGNOSIS — I2699 Other pulmonary embolism without acute cor pulmonale: Secondary | ICD-10-CM | POA: Diagnosis not present

## 2016-10-30 DIAGNOSIS — K760 Fatty (change of) liver, not elsewhere classified: Secondary | ICD-10-CM | POA: Diagnosis not present

## 2016-10-30 DIAGNOSIS — D631 Anemia in chronic kidney disease: Secondary | ICD-10-CM | POA: Diagnosis not present

## 2016-11-01 DIAGNOSIS — D631 Anemia in chronic kidney disease: Secondary | ICD-10-CM | POA: Diagnosis not present

## 2016-11-01 DIAGNOSIS — K760 Fatty (change of) liver, not elsewhere classified: Secondary | ICD-10-CM | POA: Diagnosis not present

## 2016-11-01 DIAGNOSIS — D509 Iron deficiency anemia, unspecified: Secondary | ICD-10-CM | POA: Diagnosis not present

## 2016-11-01 DIAGNOSIS — N2581 Secondary hyperparathyroidism of renal origin: Secondary | ICD-10-CM | POA: Diagnosis not present

## 2016-11-01 DIAGNOSIS — N186 End stage renal disease: Secondary | ICD-10-CM | POA: Diagnosis not present

## 2016-11-02 DIAGNOSIS — N186 End stage renal disease: Secondary | ICD-10-CM | POA: Diagnosis not present

## 2016-11-02 DIAGNOSIS — Z992 Dependence on renal dialysis: Secondary | ICD-10-CM | POA: Diagnosis not present

## 2016-11-02 DIAGNOSIS — Q612 Polycystic kidney, adult type: Secondary | ICD-10-CM | POA: Diagnosis not present

## 2016-11-04 DIAGNOSIS — D631 Anemia in chronic kidney disease: Secondary | ICD-10-CM | POA: Diagnosis not present

## 2016-11-04 DIAGNOSIS — K760 Fatty (change of) liver, not elsewhere classified: Secondary | ICD-10-CM | POA: Diagnosis not present

## 2016-11-04 DIAGNOSIS — D509 Iron deficiency anemia, unspecified: Secondary | ICD-10-CM | POA: Diagnosis not present

## 2016-11-04 DIAGNOSIS — N186 End stage renal disease: Secondary | ICD-10-CM | POA: Diagnosis not present

## 2016-11-04 DIAGNOSIS — N2581 Secondary hyperparathyroidism of renal origin: Secondary | ICD-10-CM | POA: Diagnosis not present

## 2016-11-06 DIAGNOSIS — D631 Anemia in chronic kidney disease: Secondary | ICD-10-CM | POA: Diagnosis not present

## 2016-11-06 DIAGNOSIS — D509 Iron deficiency anemia, unspecified: Secondary | ICD-10-CM | POA: Diagnosis not present

## 2016-11-06 DIAGNOSIS — K760 Fatty (change of) liver, not elsewhere classified: Secondary | ICD-10-CM | POA: Diagnosis not present

## 2016-11-06 DIAGNOSIS — N186 End stage renal disease: Secondary | ICD-10-CM | POA: Diagnosis not present

## 2016-11-06 DIAGNOSIS — N2581 Secondary hyperparathyroidism of renal origin: Secondary | ICD-10-CM | POA: Diagnosis not present

## 2016-11-06 DIAGNOSIS — I2699 Other pulmonary embolism without acute cor pulmonale: Secondary | ICD-10-CM | POA: Diagnosis not present

## 2016-11-08 DIAGNOSIS — D509 Iron deficiency anemia, unspecified: Secondary | ICD-10-CM | POA: Diagnosis not present

## 2016-11-08 DIAGNOSIS — D631 Anemia in chronic kidney disease: Secondary | ICD-10-CM | POA: Diagnosis not present

## 2016-11-08 DIAGNOSIS — N186 End stage renal disease: Secondary | ICD-10-CM | POA: Diagnosis not present

## 2016-11-08 DIAGNOSIS — N2581 Secondary hyperparathyroidism of renal origin: Secondary | ICD-10-CM | POA: Diagnosis not present

## 2016-11-08 DIAGNOSIS — K760 Fatty (change of) liver, not elsewhere classified: Secondary | ICD-10-CM | POA: Diagnosis not present

## 2016-11-11 DIAGNOSIS — K760 Fatty (change of) liver, not elsewhere classified: Secondary | ICD-10-CM | POA: Diagnosis not present

## 2016-11-11 DIAGNOSIS — D509 Iron deficiency anemia, unspecified: Secondary | ICD-10-CM | POA: Diagnosis not present

## 2016-11-11 DIAGNOSIS — N186 End stage renal disease: Secondary | ICD-10-CM | POA: Diagnosis not present

## 2016-11-11 DIAGNOSIS — D631 Anemia in chronic kidney disease: Secondary | ICD-10-CM | POA: Diagnosis not present

## 2016-11-11 DIAGNOSIS — N2581 Secondary hyperparathyroidism of renal origin: Secondary | ICD-10-CM | POA: Diagnosis not present

## 2016-11-13 DIAGNOSIS — I2699 Other pulmonary embolism without acute cor pulmonale: Secondary | ICD-10-CM | POA: Diagnosis not present

## 2016-11-13 DIAGNOSIS — D631 Anemia in chronic kidney disease: Secondary | ICD-10-CM | POA: Diagnosis not present

## 2016-11-13 DIAGNOSIS — K760 Fatty (change of) liver, not elsewhere classified: Secondary | ICD-10-CM | POA: Diagnosis not present

## 2016-11-13 DIAGNOSIS — D509 Iron deficiency anemia, unspecified: Secondary | ICD-10-CM | POA: Diagnosis not present

## 2016-11-13 DIAGNOSIS — N2581 Secondary hyperparathyroidism of renal origin: Secondary | ICD-10-CM | POA: Diagnosis not present

## 2016-11-13 DIAGNOSIS — N186 End stage renal disease: Secondary | ICD-10-CM | POA: Diagnosis not present

## 2016-11-15 DIAGNOSIS — D631 Anemia in chronic kidney disease: Secondary | ICD-10-CM | POA: Diagnosis not present

## 2016-11-15 DIAGNOSIS — N186 End stage renal disease: Secondary | ICD-10-CM | POA: Diagnosis not present

## 2016-11-15 DIAGNOSIS — N2581 Secondary hyperparathyroidism of renal origin: Secondary | ICD-10-CM | POA: Diagnosis not present

## 2016-11-15 DIAGNOSIS — K760 Fatty (change of) liver, not elsewhere classified: Secondary | ICD-10-CM | POA: Diagnosis not present

## 2016-11-15 DIAGNOSIS — D509 Iron deficiency anemia, unspecified: Secondary | ICD-10-CM | POA: Diagnosis not present

## 2016-11-18 DIAGNOSIS — N186 End stage renal disease: Secondary | ICD-10-CM | POA: Diagnosis not present

## 2016-11-18 DIAGNOSIS — D631 Anemia in chronic kidney disease: Secondary | ICD-10-CM | POA: Diagnosis not present

## 2016-11-18 DIAGNOSIS — K760 Fatty (change of) liver, not elsewhere classified: Secondary | ICD-10-CM | POA: Diagnosis not present

## 2016-11-18 DIAGNOSIS — D509 Iron deficiency anemia, unspecified: Secondary | ICD-10-CM | POA: Diagnosis not present

## 2016-11-18 DIAGNOSIS — N2581 Secondary hyperparathyroidism of renal origin: Secondary | ICD-10-CM | POA: Diagnosis not present

## 2016-11-20 DIAGNOSIS — I2699 Other pulmonary embolism without acute cor pulmonale: Secondary | ICD-10-CM | POA: Diagnosis not present

## 2016-11-20 DIAGNOSIS — N186 End stage renal disease: Secondary | ICD-10-CM | POA: Diagnosis not present

## 2016-11-20 DIAGNOSIS — D509 Iron deficiency anemia, unspecified: Secondary | ICD-10-CM | POA: Diagnosis not present

## 2016-11-20 DIAGNOSIS — K760 Fatty (change of) liver, not elsewhere classified: Secondary | ICD-10-CM | POA: Diagnosis not present

## 2016-11-20 DIAGNOSIS — N2581 Secondary hyperparathyroidism of renal origin: Secondary | ICD-10-CM | POA: Diagnosis not present

## 2016-11-20 DIAGNOSIS — D631 Anemia in chronic kidney disease: Secondary | ICD-10-CM | POA: Diagnosis not present

## 2016-11-22 DIAGNOSIS — D631 Anemia in chronic kidney disease: Secondary | ICD-10-CM | POA: Diagnosis not present

## 2016-11-22 DIAGNOSIS — N2581 Secondary hyperparathyroidism of renal origin: Secondary | ICD-10-CM | POA: Diagnosis not present

## 2016-11-22 DIAGNOSIS — N186 End stage renal disease: Secondary | ICD-10-CM | POA: Diagnosis not present

## 2016-11-22 DIAGNOSIS — D509 Iron deficiency anemia, unspecified: Secondary | ICD-10-CM | POA: Diagnosis not present

## 2016-11-22 DIAGNOSIS — K760 Fatty (change of) liver, not elsewhere classified: Secondary | ICD-10-CM | POA: Diagnosis not present

## 2016-11-25 DIAGNOSIS — N2581 Secondary hyperparathyroidism of renal origin: Secondary | ICD-10-CM | POA: Diagnosis not present

## 2016-11-25 DIAGNOSIS — N186 End stage renal disease: Secondary | ICD-10-CM | POA: Diagnosis not present

## 2016-11-25 DIAGNOSIS — D509 Iron deficiency anemia, unspecified: Secondary | ICD-10-CM | POA: Diagnosis not present

## 2016-11-25 DIAGNOSIS — D631 Anemia in chronic kidney disease: Secondary | ICD-10-CM | POA: Diagnosis not present

## 2016-11-25 DIAGNOSIS — K760 Fatty (change of) liver, not elsewhere classified: Secondary | ICD-10-CM | POA: Diagnosis not present

## 2016-11-29 DIAGNOSIS — D509 Iron deficiency anemia, unspecified: Secondary | ICD-10-CM | POA: Diagnosis not present

## 2016-11-29 DIAGNOSIS — N2581 Secondary hyperparathyroidism of renal origin: Secondary | ICD-10-CM | POA: Diagnosis not present

## 2016-11-29 DIAGNOSIS — D631 Anemia in chronic kidney disease: Secondary | ICD-10-CM | POA: Diagnosis not present

## 2016-11-29 DIAGNOSIS — N186 End stage renal disease: Secondary | ICD-10-CM | POA: Diagnosis not present

## 2016-11-29 DIAGNOSIS — K760 Fatty (change of) liver, not elsewhere classified: Secondary | ICD-10-CM | POA: Diagnosis not present

## 2016-12-02 DIAGNOSIS — D631 Anemia in chronic kidney disease: Secondary | ICD-10-CM | POA: Diagnosis not present

## 2016-12-02 DIAGNOSIS — N2581 Secondary hyperparathyroidism of renal origin: Secondary | ICD-10-CM | POA: Diagnosis not present

## 2016-12-02 DIAGNOSIS — D509 Iron deficiency anemia, unspecified: Secondary | ICD-10-CM | POA: Diagnosis not present

## 2016-12-02 DIAGNOSIS — K760 Fatty (change of) liver, not elsewhere classified: Secondary | ICD-10-CM | POA: Diagnosis not present

## 2016-12-02 DIAGNOSIS — N186 End stage renal disease: Secondary | ICD-10-CM | POA: Diagnosis not present

## 2016-12-02 DIAGNOSIS — D689 Coagulation defect, unspecified: Secondary | ICD-10-CM | POA: Diagnosis not present

## 2016-12-03 DIAGNOSIS — Z992 Dependence on renal dialysis: Secondary | ICD-10-CM | POA: Diagnosis not present

## 2016-12-03 DIAGNOSIS — Q612 Polycystic kidney, adult type: Secondary | ICD-10-CM | POA: Diagnosis not present

## 2016-12-03 DIAGNOSIS — N186 End stage renal disease: Secondary | ICD-10-CM | POA: Diagnosis not present

## 2016-12-04 DIAGNOSIS — D631 Anemia in chronic kidney disease: Secondary | ICD-10-CM | POA: Diagnosis not present

## 2016-12-04 DIAGNOSIS — D509 Iron deficiency anemia, unspecified: Secondary | ICD-10-CM | POA: Diagnosis not present

## 2016-12-04 DIAGNOSIS — K760 Fatty (change of) liver, not elsewhere classified: Secondary | ICD-10-CM | POA: Diagnosis not present

## 2016-12-04 DIAGNOSIS — N2581 Secondary hyperparathyroidism of renal origin: Secondary | ICD-10-CM | POA: Diagnosis not present

## 2016-12-04 DIAGNOSIS — I2699 Other pulmonary embolism without acute cor pulmonale: Secondary | ICD-10-CM | POA: Diagnosis not present

## 2016-12-04 DIAGNOSIS — N186 End stage renal disease: Secondary | ICD-10-CM | POA: Diagnosis not present

## 2016-12-07 DIAGNOSIS — N186 End stage renal disease: Secondary | ICD-10-CM | POA: Diagnosis not present

## 2016-12-07 DIAGNOSIS — N2581 Secondary hyperparathyroidism of renal origin: Secondary | ICD-10-CM | POA: Diagnosis not present

## 2016-12-07 DIAGNOSIS — D631 Anemia in chronic kidney disease: Secondary | ICD-10-CM | POA: Diagnosis not present

## 2016-12-07 DIAGNOSIS — D509 Iron deficiency anemia, unspecified: Secondary | ICD-10-CM | POA: Diagnosis not present

## 2016-12-07 DIAGNOSIS — K760 Fatty (change of) liver, not elsewhere classified: Secondary | ICD-10-CM | POA: Diagnosis not present

## 2016-12-09 DIAGNOSIS — N2581 Secondary hyperparathyroidism of renal origin: Secondary | ICD-10-CM | POA: Diagnosis not present

## 2016-12-09 DIAGNOSIS — D631 Anemia in chronic kidney disease: Secondary | ICD-10-CM | POA: Diagnosis not present

## 2016-12-09 DIAGNOSIS — D509 Iron deficiency anemia, unspecified: Secondary | ICD-10-CM | POA: Diagnosis not present

## 2016-12-09 DIAGNOSIS — N186 End stage renal disease: Secondary | ICD-10-CM | POA: Diagnosis not present

## 2016-12-09 DIAGNOSIS — K760 Fatty (change of) liver, not elsewhere classified: Secondary | ICD-10-CM | POA: Diagnosis not present

## 2016-12-11 DIAGNOSIS — D509 Iron deficiency anemia, unspecified: Secondary | ICD-10-CM | POA: Diagnosis not present

## 2016-12-11 DIAGNOSIS — D631 Anemia in chronic kidney disease: Secondary | ICD-10-CM | POA: Diagnosis not present

## 2016-12-11 DIAGNOSIS — I2699 Other pulmonary embolism without acute cor pulmonale: Secondary | ICD-10-CM | POA: Diagnosis not present

## 2016-12-11 DIAGNOSIS — N186 End stage renal disease: Secondary | ICD-10-CM | POA: Diagnosis not present

## 2016-12-11 DIAGNOSIS — N2581 Secondary hyperparathyroidism of renal origin: Secondary | ICD-10-CM | POA: Diagnosis not present

## 2016-12-11 DIAGNOSIS — K760 Fatty (change of) liver, not elsewhere classified: Secondary | ICD-10-CM | POA: Diagnosis not present

## 2016-12-14 DIAGNOSIS — N2581 Secondary hyperparathyroidism of renal origin: Secondary | ICD-10-CM | POA: Diagnosis not present

## 2016-12-14 DIAGNOSIS — D631 Anemia in chronic kidney disease: Secondary | ICD-10-CM | POA: Diagnosis not present

## 2016-12-14 DIAGNOSIS — K760 Fatty (change of) liver, not elsewhere classified: Secondary | ICD-10-CM | POA: Diagnosis not present

## 2016-12-14 DIAGNOSIS — N186 End stage renal disease: Secondary | ICD-10-CM | POA: Diagnosis not present

## 2016-12-14 DIAGNOSIS — D509 Iron deficiency anemia, unspecified: Secondary | ICD-10-CM | POA: Diagnosis not present

## 2016-12-17 DIAGNOSIS — D509 Iron deficiency anemia, unspecified: Secondary | ICD-10-CM | POA: Diagnosis not present

## 2016-12-17 DIAGNOSIS — K760 Fatty (change of) liver, not elsewhere classified: Secondary | ICD-10-CM | POA: Diagnosis not present

## 2016-12-17 DIAGNOSIS — N2581 Secondary hyperparathyroidism of renal origin: Secondary | ICD-10-CM | POA: Diagnosis not present

## 2016-12-17 DIAGNOSIS — D631 Anemia in chronic kidney disease: Secondary | ICD-10-CM | POA: Diagnosis not present

## 2016-12-17 DIAGNOSIS — N186 End stage renal disease: Secondary | ICD-10-CM | POA: Diagnosis not present

## 2016-12-18 DIAGNOSIS — D509 Iron deficiency anemia, unspecified: Secondary | ICD-10-CM | POA: Diagnosis not present

## 2016-12-18 DIAGNOSIS — D631 Anemia in chronic kidney disease: Secondary | ICD-10-CM | POA: Diagnosis not present

## 2016-12-18 DIAGNOSIS — I2699 Other pulmonary embolism without acute cor pulmonale: Secondary | ICD-10-CM | POA: Diagnosis not present

## 2016-12-18 DIAGNOSIS — K760 Fatty (change of) liver, not elsewhere classified: Secondary | ICD-10-CM | POA: Diagnosis not present

## 2016-12-18 DIAGNOSIS — N186 End stage renal disease: Secondary | ICD-10-CM | POA: Diagnosis not present

## 2016-12-18 DIAGNOSIS — N2581 Secondary hyperparathyroidism of renal origin: Secondary | ICD-10-CM | POA: Diagnosis not present

## 2016-12-20 DIAGNOSIS — D509 Iron deficiency anemia, unspecified: Secondary | ICD-10-CM | POA: Diagnosis not present

## 2016-12-20 DIAGNOSIS — N186 End stage renal disease: Secondary | ICD-10-CM | POA: Diagnosis not present

## 2016-12-20 DIAGNOSIS — K760 Fatty (change of) liver, not elsewhere classified: Secondary | ICD-10-CM | POA: Diagnosis not present

## 2016-12-20 DIAGNOSIS — D631 Anemia in chronic kidney disease: Secondary | ICD-10-CM | POA: Diagnosis not present

## 2016-12-20 DIAGNOSIS — N2581 Secondary hyperparathyroidism of renal origin: Secondary | ICD-10-CM | POA: Diagnosis not present

## 2016-12-23 DIAGNOSIS — N186 End stage renal disease: Secondary | ICD-10-CM | POA: Diagnosis not present

## 2016-12-23 DIAGNOSIS — N2581 Secondary hyperparathyroidism of renal origin: Secondary | ICD-10-CM | POA: Diagnosis not present

## 2016-12-23 DIAGNOSIS — D509 Iron deficiency anemia, unspecified: Secondary | ICD-10-CM | POA: Diagnosis not present

## 2016-12-23 DIAGNOSIS — K760 Fatty (change of) liver, not elsewhere classified: Secondary | ICD-10-CM | POA: Diagnosis not present

## 2016-12-23 DIAGNOSIS — D631 Anemia in chronic kidney disease: Secondary | ICD-10-CM | POA: Diagnosis not present

## 2016-12-25 DIAGNOSIS — K760 Fatty (change of) liver, not elsewhere classified: Secondary | ICD-10-CM | POA: Diagnosis not present

## 2016-12-25 DIAGNOSIS — N2581 Secondary hyperparathyroidism of renal origin: Secondary | ICD-10-CM | POA: Diagnosis not present

## 2016-12-25 DIAGNOSIS — I2699 Other pulmonary embolism without acute cor pulmonale: Secondary | ICD-10-CM | POA: Diagnosis not present

## 2016-12-25 DIAGNOSIS — D631 Anemia in chronic kidney disease: Secondary | ICD-10-CM | POA: Diagnosis not present

## 2016-12-25 DIAGNOSIS — D509 Iron deficiency anemia, unspecified: Secondary | ICD-10-CM | POA: Diagnosis not present

## 2016-12-25 DIAGNOSIS — N186 End stage renal disease: Secondary | ICD-10-CM | POA: Diagnosis not present

## 2016-12-27 DIAGNOSIS — N186 End stage renal disease: Secondary | ICD-10-CM | POA: Diagnosis not present

## 2016-12-27 DIAGNOSIS — D631 Anemia in chronic kidney disease: Secondary | ICD-10-CM | POA: Diagnosis not present

## 2016-12-27 DIAGNOSIS — D509 Iron deficiency anemia, unspecified: Secondary | ICD-10-CM | POA: Diagnosis not present

## 2016-12-27 DIAGNOSIS — N2581 Secondary hyperparathyroidism of renal origin: Secondary | ICD-10-CM | POA: Diagnosis not present

## 2016-12-27 DIAGNOSIS — K760 Fatty (change of) liver, not elsewhere classified: Secondary | ICD-10-CM | POA: Diagnosis not present

## 2016-12-30 DIAGNOSIS — N2581 Secondary hyperparathyroidism of renal origin: Secondary | ICD-10-CM | POA: Diagnosis not present

## 2016-12-30 DIAGNOSIS — K760 Fatty (change of) liver, not elsewhere classified: Secondary | ICD-10-CM | POA: Diagnosis not present

## 2016-12-30 DIAGNOSIS — D631 Anemia in chronic kidney disease: Secondary | ICD-10-CM | POA: Diagnosis not present

## 2016-12-30 DIAGNOSIS — D509 Iron deficiency anemia, unspecified: Secondary | ICD-10-CM | POA: Diagnosis not present

## 2016-12-30 DIAGNOSIS — N186 End stage renal disease: Secondary | ICD-10-CM | POA: Diagnosis not present

## 2017-01-01 DIAGNOSIS — D509 Iron deficiency anemia, unspecified: Secondary | ICD-10-CM | POA: Diagnosis not present

## 2017-01-01 DIAGNOSIS — D631 Anemia in chronic kidney disease: Secondary | ICD-10-CM | POA: Diagnosis not present

## 2017-01-01 DIAGNOSIS — N186 End stage renal disease: Secondary | ICD-10-CM | POA: Diagnosis not present

## 2017-01-01 DIAGNOSIS — K760 Fatty (change of) liver, not elsewhere classified: Secondary | ICD-10-CM | POA: Diagnosis not present

## 2017-01-01 DIAGNOSIS — N2581 Secondary hyperparathyroidism of renal origin: Secondary | ICD-10-CM | POA: Diagnosis not present

## 2017-01-01 DIAGNOSIS — I2699 Other pulmonary embolism without acute cor pulmonale: Secondary | ICD-10-CM | POA: Diagnosis not present

## 2017-01-03 DIAGNOSIS — K760 Fatty (change of) liver, not elsewhere classified: Secondary | ICD-10-CM | POA: Diagnosis not present

## 2017-01-03 DIAGNOSIS — N2581 Secondary hyperparathyroidism of renal origin: Secondary | ICD-10-CM | POA: Diagnosis not present

## 2017-01-03 DIAGNOSIS — D509 Iron deficiency anemia, unspecified: Secondary | ICD-10-CM | POA: Diagnosis not present

## 2017-01-03 DIAGNOSIS — Q612 Polycystic kidney, adult type: Secondary | ICD-10-CM | POA: Diagnosis not present

## 2017-01-03 DIAGNOSIS — Z992 Dependence on renal dialysis: Secondary | ICD-10-CM | POA: Diagnosis not present

## 2017-01-03 DIAGNOSIS — N186 End stage renal disease: Secondary | ICD-10-CM | POA: Diagnosis not present

## 2017-01-03 DIAGNOSIS — D631 Anemia in chronic kidney disease: Secondary | ICD-10-CM | POA: Diagnosis not present

## 2017-01-06 DIAGNOSIS — K760 Fatty (change of) liver, not elsewhere classified: Secondary | ICD-10-CM | POA: Diagnosis not present

## 2017-01-06 DIAGNOSIS — N186 End stage renal disease: Secondary | ICD-10-CM | POA: Diagnosis not present

## 2017-01-06 DIAGNOSIS — D509 Iron deficiency anemia, unspecified: Secondary | ICD-10-CM | POA: Diagnosis not present

## 2017-01-06 DIAGNOSIS — N2581 Secondary hyperparathyroidism of renal origin: Secondary | ICD-10-CM | POA: Diagnosis not present

## 2017-01-06 DIAGNOSIS — D631 Anemia in chronic kidney disease: Secondary | ICD-10-CM | POA: Diagnosis not present

## 2017-01-08 DIAGNOSIS — I2699 Other pulmonary embolism without acute cor pulmonale: Secondary | ICD-10-CM | POA: Diagnosis not present

## 2017-01-08 DIAGNOSIS — D631 Anemia in chronic kidney disease: Secondary | ICD-10-CM | POA: Diagnosis not present

## 2017-01-08 DIAGNOSIS — K760 Fatty (change of) liver, not elsewhere classified: Secondary | ICD-10-CM | POA: Diagnosis not present

## 2017-01-08 DIAGNOSIS — D509 Iron deficiency anemia, unspecified: Secondary | ICD-10-CM | POA: Diagnosis not present

## 2017-01-08 DIAGNOSIS — N2581 Secondary hyperparathyroidism of renal origin: Secondary | ICD-10-CM | POA: Diagnosis not present

## 2017-01-08 DIAGNOSIS — N186 End stage renal disease: Secondary | ICD-10-CM | POA: Diagnosis not present

## 2017-01-10 DIAGNOSIS — D509 Iron deficiency anemia, unspecified: Secondary | ICD-10-CM | POA: Diagnosis not present

## 2017-01-10 DIAGNOSIS — N2581 Secondary hyperparathyroidism of renal origin: Secondary | ICD-10-CM | POA: Diagnosis not present

## 2017-01-10 DIAGNOSIS — N186 End stage renal disease: Secondary | ICD-10-CM | POA: Diagnosis not present

## 2017-01-10 DIAGNOSIS — K760 Fatty (change of) liver, not elsewhere classified: Secondary | ICD-10-CM | POA: Diagnosis not present

## 2017-01-10 DIAGNOSIS — D631 Anemia in chronic kidney disease: Secondary | ICD-10-CM | POA: Diagnosis not present

## 2017-01-13 DIAGNOSIS — D509 Iron deficiency anemia, unspecified: Secondary | ICD-10-CM | POA: Diagnosis not present

## 2017-01-13 DIAGNOSIS — N2581 Secondary hyperparathyroidism of renal origin: Secondary | ICD-10-CM | POA: Diagnosis not present

## 2017-01-13 DIAGNOSIS — N186 End stage renal disease: Secondary | ICD-10-CM | POA: Diagnosis not present

## 2017-01-13 DIAGNOSIS — K760 Fatty (change of) liver, not elsewhere classified: Secondary | ICD-10-CM | POA: Diagnosis not present

## 2017-01-13 DIAGNOSIS — D631 Anemia in chronic kidney disease: Secondary | ICD-10-CM | POA: Diagnosis not present

## 2017-01-15 DIAGNOSIS — D631 Anemia in chronic kidney disease: Secondary | ICD-10-CM | POA: Diagnosis not present

## 2017-01-15 DIAGNOSIS — N186 End stage renal disease: Secondary | ICD-10-CM | POA: Diagnosis not present

## 2017-01-15 DIAGNOSIS — D509 Iron deficiency anemia, unspecified: Secondary | ICD-10-CM | POA: Diagnosis not present

## 2017-01-15 DIAGNOSIS — N2581 Secondary hyperparathyroidism of renal origin: Secondary | ICD-10-CM | POA: Diagnosis not present

## 2017-01-15 DIAGNOSIS — K760 Fatty (change of) liver, not elsewhere classified: Secondary | ICD-10-CM | POA: Diagnosis not present

## 2017-01-15 DIAGNOSIS — I2699 Other pulmonary embolism without acute cor pulmonale: Secondary | ICD-10-CM | POA: Diagnosis not present

## 2017-01-17 DIAGNOSIS — D631 Anemia in chronic kidney disease: Secondary | ICD-10-CM | POA: Diagnosis not present

## 2017-01-17 DIAGNOSIS — D509 Iron deficiency anemia, unspecified: Secondary | ICD-10-CM | POA: Diagnosis not present

## 2017-01-17 DIAGNOSIS — N2581 Secondary hyperparathyroidism of renal origin: Secondary | ICD-10-CM | POA: Diagnosis not present

## 2017-01-17 DIAGNOSIS — N186 End stage renal disease: Secondary | ICD-10-CM | POA: Diagnosis not present

## 2017-01-17 DIAGNOSIS — K760 Fatty (change of) liver, not elsewhere classified: Secondary | ICD-10-CM | POA: Diagnosis not present

## 2017-01-20 DIAGNOSIS — K760 Fatty (change of) liver, not elsewhere classified: Secondary | ICD-10-CM | POA: Diagnosis not present

## 2017-01-20 DIAGNOSIS — N186 End stage renal disease: Secondary | ICD-10-CM | POA: Diagnosis not present

## 2017-01-20 DIAGNOSIS — D631 Anemia in chronic kidney disease: Secondary | ICD-10-CM | POA: Diagnosis not present

## 2017-01-20 DIAGNOSIS — N2581 Secondary hyperparathyroidism of renal origin: Secondary | ICD-10-CM | POA: Diagnosis not present

## 2017-01-20 DIAGNOSIS — D509 Iron deficiency anemia, unspecified: Secondary | ICD-10-CM | POA: Diagnosis not present

## 2017-01-23 DIAGNOSIS — N186 End stage renal disease: Secondary | ICD-10-CM | POA: Diagnosis not present

## 2017-01-23 DIAGNOSIS — D631 Anemia in chronic kidney disease: Secondary | ICD-10-CM | POA: Diagnosis not present

## 2017-01-23 DIAGNOSIS — N2581 Secondary hyperparathyroidism of renal origin: Secondary | ICD-10-CM | POA: Diagnosis not present

## 2017-01-25 DIAGNOSIS — N2581 Secondary hyperparathyroidism of renal origin: Secondary | ICD-10-CM | POA: Diagnosis not present

## 2017-01-25 DIAGNOSIS — D631 Anemia in chronic kidney disease: Secondary | ICD-10-CM | POA: Diagnosis not present

## 2017-01-25 DIAGNOSIS — N186 End stage renal disease: Secondary | ICD-10-CM | POA: Diagnosis not present

## 2017-01-28 DIAGNOSIS — D631 Anemia in chronic kidney disease: Secondary | ICD-10-CM | POA: Diagnosis not present

## 2017-01-28 DIAGNOSIS — N186 End stage renal disease: Secondary | ICD-10-CM | POA: Diagnosis not present

## 2017-01-28 DIAGNOSIS — N2581 Secondary hyperparathyroidism of renal origin: Secondary | ICD-10-CM | POA: Diagnosis not present

## 2017-01-30 DIAGNOSIS — N186 End stage renal disease: Secondary | ICD-10-CM | POA: Diagnosis not present

## 2017-01-30 DIAGNOSIS — N2581 Secondary hyperparathyroidism of renal origin: Secondary | ICD-10-CM | POA: Diagnosis not present

## 2017-01-30 DIAGNOSIS — D631 Anemia in chronic kidney disease: Secondary | ICD-10-CM | POA: Diagnosis not present

## 2017-02-01 DIAGNOSIS — N2581 Secondary hyperparathyroidism of renal origin: Secondary | ICD-10-CM | POA: Diagnosis not present

## 2017-02-01 DIAGNOSIS — D631 Anemia in chronic kidney disease: Secondary | ICD-10-CM | POA: Diagnosis not present

## 2017-02-01 DIAGNOSIS — N186 End stage renal disease: Secondary | ICD-10-CM | POA: Diagnosis not present

## 2017-02-01 DIAGNOSIS — T82818A Embolism of vascular prosthetic devices, implants and grafts, initial encounter: Secondary | ICD-10-CM | POA: Diagnosis not present

## 2017-02-01 DIAGNOSIS — I2782 Chronic pulmonary embolism: Secondary | ICD-10-CM | POA: Diagnosis not present

## 2017-02-02 DIAGNOSIS — Q612 Polycystic kidney, adult type: Secondary | ICD-10-CM | POA: Diagnosis not present

## 2017-02-02 DIAGNOSIS — N186 End stage renal disease: Secondary | ICD-10-CM | POA: Diagnosis not present

## 2017-02-02 DIAGNOSIS — Z992 Dependence on renal dialysis: Secondary | ICD-10-CM | POA: Diagnosis not present

## 2017-02-04 DIAGNOSIS — N2581 Secondary hyperparathyroidism of renal origin: Secondary | ICD-10-CM | POA: Diagnosis not present

## 2017-02-04 DIAGNOSIS — D631 Anemia in chronic kidney disease: Secondary | ICD-10-CM | POA: Diagnosis not present

## 2017-02-04 DIAGNOSIS — Z23 Encounter for immunization: Secondary | ICD-10-CM | POA: Diagnosis not present

## 2017-02-04 DIAGNOSIS — N186 End stage renal disease: Secondary | ICD-10-CM | POA: Diagnosis not present

## 2017-02-06 DIAGNOSIS — N2581 Secondary hyperparathyroidism of renal origin: Secondary | ICD-10-CM | POA: Diagnosis not present

## 2017-02-06 DIAGNOSIS — T82818A Embolism of vascular prosthetic devices, implants and grafts, initial encounter: Secondary | ICD-10-CM | POA: Diagnosis not present

## 2017-02-06 DIAGNOSIS — I2782 Chronic pulmonary embolism: Secondary | ICD-10-CM | POA: Diagnosis not present

## 2017-02-06 DIAGNOSIS — Z23 Encounter for immunization: Secondary | ICD-10-CM | POA: Diagnosis not present

## 2017-02-06 DIAGNOSIS — D631 Anemia in chronic kidney disease: Secondary | ICD-10-CM | POA: Diagnosis not present

## 2017-02-06 DIAGNOSIS — N186 End stage renal disease: Secondary | ICD-10-CM | POA: Diagnosis not present

## 2017-02-08 DIAGNOSIS — Z23 Encounter for immunization: Secondary | ICD-10-CM | POA: Diagnosis not present

## 2017-02-08 DIAGNOSIS — N2581 Secondary hyperparathyroidism of renal origin: Secondary | ICD-10-CM | POA: Diagnosis not present

## 2017-02-08 DIAGNOSIS — N186 End stage renal disease: Secondary | ICD-10-CM | POA: Diagnosis not present

## 2017-02-08 DIAGNOSIS — D631 Anemia in chronic kidney disease: Secondary | ICD-10-CM | POA: Diagnosis not present

## 2017-02-11 DIAGNOSIS — N186 End stage renal disease: Secondary | ICD-10-CM | POA: Diagnosis not present

## 2017-02-11 DIAGNOSIS — Z23 Encounter for immunization: Secondary | ICD-10-CM | POA: Diagnosis not present

## 2017-02-11 DIAGNOSIS — N2581 Secondary hyperparathyroidism of renal origin: Secondary | ICD-10-CM | POA: Diagnosis not present

## 2017-02-11 DIAGNOSIS — D631 Anemia in chronic kidney disease: Secondary | ICD-10-CM | POA: Diagnosis not present

## 2017-02-13 DIAGNOSIS — N186 End stage renal disease: Secondary | ICD-10-CM | POA: Diagnosis not present

## 2017-02-13 DIAGNOSIS — T82818A Embolism of vascular prosthetic devices, implants and grafts, initial encounter: Secondary | ICD-10-CM | POA: Diagnosis not present

## 2017-02-13 DIAGNOSIS — Z23 Encounter for immunization: Secondary | ICD-10-CM | POA: Diagnosis not present

## 2017-02-13 DIAGNOSIS — D631 Anemia in chronic kidney disease: Secondary | ICD-10-CM | POA: Diagnosis not present

## 2017-02-13 DIAGNOSIS — N2581 Secondary hyperparathyroidism of renal origin: Secondary | ICD-10-CM | POA: Diagnosis not present

## 2017-02-13 DIAGNOSIS — I2699 Other pulmonary embolism without acute cor pulmonale: Secondary | ICD-10-CM | POA: Diagnosis not present

## 2017-02-15 DIAGNOSIS — N186 End stage renal disease: Secondary | ICD-10-CM | POA: Diagnosis not present

## 2017-02-15 DIAGNOSIS — D631 Anemia in chronic kidney disease: Secondary | ICD-10-CM | POA: Diagnosis not present

## 2017-02-15 DIAGNOSIS — N2581 Secondary hyperparathyroidism of renal origin: Secondary | ICD-10-CM | POA: Diagnosis not present

## 2017-02-15 DIAGNOSIS — Z23 Encounter for immunization: Secondary | ICD-10-CM | POA: Diagnosis not present

## 2017-02-18 DIAGNOSIS — N2581 Secondary hyperparathyroidism of renal origin: Secondary | ICD-10-CM | POA: Diagnosis not present

## 2017-02-18 DIAGNOSIS — Z23 Encounter for immunization: Secondary | ICD-10-CM | POA: Diagnosis not present

## 2017-02-18 DIAGNOSIS — D631 Anemia in chronic kidney disease: Secondary | ICD-10-CM | POA: Diagnosis not present

## 2017-02-18 DIAGNOSIS — N186 End stage renal disease: Secondary | ICD-10-CM | POA: Diagnosis not present

## 2017-02-20 DIAGNOSIS — D631 Anemia in chronic kidney disease: Secondary | ICD-10-CM | POA: Diagnosis not present

## 2017-02-20 DIAGNOSIS — Z23 Encounter for immunization: Secondary | ICD-10-CM | POA: Diagnosis not present

## 2017-02-20 DIAGNOSIS — N186 End stage renal disease: Secondary | ICD-10-CM | POA: Diagnosis not present

## 2017-02-20 DIAGNOSIS — N2581 Secondary hyperparathyroidism of renal origin: Secondary | ICD-10-CM | POA: Diagnosis not present

## 2017-02-22 DIAGNOSIS — D631 Anemia in chronic kidney disease: Secondary | ICD-10-CM | POA: Diagnosis not present

## 2017-02-22 DIAGNOSIS — N2581 Secondary hyperparathyroidism of renal origin: Secondary | ICD-10-CM | POA: Diagnosis not present

## 2017-02-22 DIAGNOSIS — N186 End stage renal disease: Secondary | ICD-10-CM | POA: Diagnosis not present

## 2017-02-22 DIAGNOSIS — Z23 Encounter for immunization: Secondary | ICD-10-CM | POA: Diagnosis not present

## 2017-02-25 DIAGNOSIS — N2581 Secondary hyperparathyroidism of renal origin: Secondary | ICD-10-CM | POA: Diagnosis not present

## 2017-02-25 DIAGNOSIS — N186 End stage renal disease: Secondary | ICD-10-CM | POA: Diagnosis not present

## 2017-02-25 DIAGNOSIS — D631 Anemia in chronic kidney disease: Secondary | ICD-10-CM | POA: Diagnosis not present

## 2017-02-25 DIAGNOSIS — Z23 Encounter for immunization: Secondary | ICD-10-CM | POA: Diagnosis not present

## 2017-02-27 DIAGNOSIS — Z23 Encounter for immunization: Secondary | ICD-10-CM | POA: Diagnosis not present

## 2017-02-27 DIAGNOSIS — N186 End stage renal disease: Secondary | ICD-10-CM | POA: Diagnosis not present

## 2017-02-27 DIAGNOSIS — D631 Anemia in chronic kidney disease: Secondary | ICD-10-CM | POA: Diagnosis not present

## 2017-02-27 DIAGNOSIS — N2581 Secondary hyperparathyroidism of renal origin: Secondary | ICD-10-CM | POA: Diagnosis not present

## 2017-03-01 DIAGNOSIS — D631 Anemia in chronic kidney disease: Secondary | ICD-10-CM | POA: Diagnosis not present

## 2017-03-01 DIAGNOSIS — N2581 Secondary hyperparathyroidism of renal origin: Secondary | ICD-10-CM | POA: Diagnosis not present

## 2017-03-01 DIAGNOSIS — Z23 Encounter for immunization: Secondary | ICD-10-CM | POA: Diagnosis not present

## 2017-03-01 DIAGNOSIS — N186 End stage renal disease: Secondary | ICD-10-CM | POA: Diagnosis not present

## 2017-03-04 DIAGNOSIS — N2581 Secondary hyperparathyroidism of renal origin: Secondary | ICD-10-CM | POA: Diagnosis not present

## 2017-03-04 DIAGNOSIS — N186 End stage renal disease: Secondary | ICD-10-CM | POA: Diagnosis not present

## 2017-03-04 DIAGNOSIS — Z23 Encounter for immunization: Secondary | ICD-10-CM | POA: Diagnosis not present

## 2017-03-04 DIAGNOSIS — D631 Anemia in chronic kidney disease: Secondary | ICD-10-CM | POA: Diagnosis not present

## 2017-03-05 DIAGNOSIS — Z992 Dependence on renal dialysis: Secondary | ICD-10-CM | POA: Diagnosis not present

## 2017-03-05 DIAGNOSIS — N186 End stage renal disease: Secondary | ICD-10-CM | POA: Diagnosis not present

## 2017-03-05 DIAGNOSIS — Q612 Polycystic kidney, adult type: Secondary | ICD-10-CM | POA: Diagnosis not present

## 2017-03-06 DIAGNOSIS — N186 End stage renal disease: Secondary | ICD-10-CM | POA: Diagnosis not present

## 2017-03-06 DIAGNOSIS — N2581 Secondary hyperparathyroidism of renal origin: Secondary | ICD-10-CM | POA: Diagnosis not present

## 2017-03-08 DIAGNOSIS — N186 End stage renal disease: Secondary | ICD-10-CM | POA: Diagnosis not present

## 2017-03-08 DIAGNOSIS — N2581 Secondary hyperparathyroidism of renal origin: Secondary | ICD-10-CM | POA: Diagnosis not present

## 2017-03-11 DIAGNOSIS — N186 End stage renal disease: Secondary | ICD-10-CM | POA: Diagnosis not present

## 2017-03-11 DIAGNOSIS — N2581 Secondary hyperparathyroidism of renal origin: Secondary | ICD-10-CM | POA: Diagnosis not present

## 2017-03-13 DIAGNOSIS — N186 End stage renal disease: Secondary | ICD-10-CM | POA: Diagnosis not present

## 2017-03-13 DIAGNOSIS — N2581 Secondary hyperparathyroidism of renal origin: Secondary | ICD-10-CM | POA: Diagnosis not present

## 2017-03-15 DIAGNOSIS — N2581 Secondary hyperparathyroidism of renal origin: Secondary | ICD-10-CM | POA: Diagnosis not present

## 2017-03-15 DIAGNOSIS — N186 End stage renal disease: Secondary | ICD-10-CM | POA: Diagnosis not present

## 2017-03-20 DIAGNOSIS — N2581 Secondary hyperparathyroidism of renal origin: Secondary | ICD-10-CM | POA: Diagnosis not present

## 2017-03-20 DIAGNOSIS — N186 End stage renal disease: Secondary | ICD-10-CM | POA: Diagnosis not present

## 2017-03-20 DIAGNOSIS — T82818A Embolism of vascular prosthetic devices, implants and grafts, initial encounter: Secondary | ICD-10-CM | POA: Diagnosis not present

## 2017-03-22 DIAGNOSIS — N186 End stage renal disease: Secondary | ICD-10-CM | POA: Diagnosis not present

## 2017-03-22 DIAGNOSIS — N2581 Secondary hyperparathyroidism of renal origin: Secondary | ICD-10-CM | POA: Diagnosis not present

## 2017-03-24 DIAGNOSIS — N2581 Secondary hyperparathyroidism of renal origin: Secondary | ICD-10-CM | POA: Diagnosis not present

## 2017-03-24 DIAGNOSIS — N186 End stage renal disease: Secondary | ICD-10-CM | POA: Diagnosis not present

## 2017-03-26 DIAGNOSIS — N186 End stage renal disease: Secondary | ICD-10-CM | POA: Diagnosis not present

## 2017-03-26 DIAGNOSIS — T82818A Embolism of vascular prosthetic devices, implants and grafts, initial encounter: Secondary | ICD-10-CM | POA: Diagnosis not present

## 2017-03-26 DIAGNOSIS — N2581 Secondary hyperparathyroidism of renal origin: Secondary | ICD-10-CM | POA: Diagnosis not present

## 2017-03-29 DIAGNOSIS — N186 End stage renal disease: Secondary | ICD-10-CM | POA: Diagnosis not present

## 2017-03-29 DIAGNOSIS — N2581 Secondary hyperparathyroidism of renal origin: Secondary | ICD-10-CM | POA: Diagnosis not present

## 2017-04-01 DIAGNOSIS — N186 End stage renal disease: Secondary | ICD-10-CM | POA: Diagnosis not present

## 2017-04-01 DIAGNOSIS — N2581 Secondary hyperparathyroidism of renal origin: Secondary | ICD-10-CM | POA: Diagnosis not present

## 2017-04-01 DIAGNOSIS — D631 Anemia in chronic kidney disease: Secondary | ICD-10-CM | POA: Diagnosis not present

## 2017-04-03 DIAGNOSIS — D631 Anemia in chronic kidney disease: Secondary | ICD-10-CM | POA: Diagnosis not present

## 2017-04-03 DIAGNOSIS — N2581 Secondary hyperparathyroidism of renal origin: Secondary | ICD-10-CM | POA: Diagnosis not present

## 2017-04-03 DIAGNOSIS — I2782 Chronic pulmonary embolism: Secondary | ICD-10-CM | POA: Diagnosis not present

## 2017-04-03 DIAGNOSIS — N186 End stage renal disease: Secondary | ICD-10-CM | POA: Diagnosis not present

## 2017-04-04 DIAGNOSIS — Z992 Dependence on renal dialysis: Secondary | ICD-10-CM | POA: Diagnosis not present

## 2017-04-04 DIAGNOSIS — N186 End stage renal disease: Secondary | ICD-10-CM | POA: Diagnosis not present

## 2017-04-04 DIAGNOSIS — Q612 Polycystic kidney, adult type: Secondary | ICD-10-CM | POA: Diagnosis not present

## 2017-04-05 DIAGNOSIS — D631 Anemia in chronic kidney disease: Secondary | ICD-10-CM | POA: Diagnosis not present

## 2017-04-05 DIAGNOSIS — N186 End stage renal disease: Secondary | ICD-10-CM | POA: Diagnosis not present

## 2017-04-05 DIAGNOSIS — N2581 Secondary hyperparathyroidism of renal origin: Secondary | ICD-10-CM | POA: Diagnosis not present

## 2017-04-08 DIAGNOSIS — N2581 Secondary hyperparathyroidism of renal origin: Secondary | ICD-10-CM | POA: Diagnosis not present

## 2017-04-08 DIAGNOSIS — N186 End stage renal disease: Secondary | ICD-10-CM | POA: Diagnosis not present

## 2017-04-08 DIAGNOSIS — D631 Anemia in chronic kidney disease: Secondary | ICD-10-CM | POA: Diagnosis not present

## 2017-04-10 DIAGNOSIS — N186 End stage renal disease: Secondary | ICD-10-CM | POA: Diagnosis not present

## 2017-04-10 DIAGNOSIS — D631 Anemia in chronic kidney disease: Secondary | ICD-10-CM | POA: Diagnosis not present

## 2017-04-10 DIAGNOSIS — N2581 Secondary hyperparathyroidism of renal origin: Secondary | ICD-10-CM | POA: Diagnosis not present

## 2017-04-12 DIAGNOSIS — N2581 Secondary hyperparathyroidism of renal origin: Secondary | ICD-10-CM | POA: Diagnosis not present

## 2017-04-12 DIAGNOSIS — D631 Anemia in chronic kidney disease: Secondary | ICD-10-CM | POA: Diagnosis not present

## 2017-04-12 DIAGNOSIS — N186 End stage renal disease: Secondary | ICD-10-CM | POA: Diagnosis not present

## 2017-04-15 DIAGNOSIS — D631 Anemia in chronic kidney disease: Secondary | ICD-10-CM | POA: Diagnosis not present

## 2017-04-15 DIAGNOSIS — N2581 Secondary hyperparathyroidism of renal origin: Secondary | ICD-10-CM | POA: Diagnosis not present

## 2017-04-15 DIAGNOSIS — N186 End stage renal disease: Secondary | ICD-10-CM | POA: Diagnosis not present

## 2017-04-17 DIAGNOSIS — I2782 Chronic pulmonary embolism: Secondary | ICD-10-CM | POA: Diagnosis not present

## 2017-04-17 DIAGNOSIS — N186 End stage renal disease: Secondary | ICD-10-CM | POA: Diagnosis not present

## 2017-04-17 DIAGNOSIS — N2581 Secondary hyperparathyroidism of renal origin: Secondary | ICD-10-CM | POA: Diagnosis not present

## 2017-04-17 DIAGNOSIS — D631 Anemia in chronic kidney disease: Secondary | ICD-10-CM | POA: Diagnosis not present

## 2017-04-19 DIAGNOSIS — N2581 Secondary hyperparathyroidism of renal origin: Secondary | ICD-10-CM | POA: Diagnosis not present

## 2017-04-19 DIAGNOSIS — D631 Anemia in chronic kidney disease: Secondary | ICD-10-CM | POA: Diagnosis not present

## 2017-04-19 DIAGNOSIS — N186 End stage renal disease: Secondary | ICD-10-CM | POA: Diagnosis not present

## 2017-04-23 DIAGNOSIS — N2581 Secondary hyperparathyroidism of renal origin: Secondary | ICD-10-CM | POA: Diagnosis not present

## 2017-04-23 DIAGNOSIS — D631 Anemia in chronic kidney disease: Secondary | ICD-10-CM | POA: Diagnosis not present

## 2017-04-23 DIAGNOSIS — N186 End stage renal disease: Secondary | ICD-10-CM | POA: Diagnosis not present

## 2017-04-26 DIAGNOSIS — N2581 Secondary hyperparathyroidism of renal origin: Secondary | ICD-10-CM | POA: Diagnosis not present

## 2017-04-26 DIAGNOSIS — N186 End stage renal disease: Secondary | ICD-10-CM | POA: Diagnosis not present

## 2017-04-26 DIAGNOSIS — D631 Anemia in chronic kidney disease: Secondary | ICD-10-CM | POA: Diagnosis not present

## 2017-04-28 DIAGNOSIS — D631 Anemia in chronic kidney disease: Secondary | ICD-10-CM | POA: Diagnosis not present

## 2017-04-28 DIAGNOSIS — N186 End stage renal disease: Secondary | ICD-10-CM | POA: Diagnosis not present

## 2017-04-28 DIAGNOSIS — N2581 Secondary hyperparathyroidism of renal origin: Secondary | ICD-10-CM | POA: Diagnosis not present

## 2017-05-01 DIAGNOSIS — N2581 Secondary hyperparathyroidism of renal origin: Secondary | ICD-10-CM | POA: Diagnosis not present

## 2017-05-01 DIAGNOSIS — I2782 Chronic pulmonary embolism: Secondary | ICD-10-CM | POA: Diagnosis not present

## 2017-05-01 DIAGNOSIS — D631 Anemia in chronic kidney disease: Secondary | ICD-10-CM | POA: Diagnosis not present

## 2017-05-01 DIAGNOSIS — N186 End stage renal disease: Secondary | ICD-10-CM | POA: Diagnosis not present

## 2017-05-03 DIAGNOSIS — D631 Anemia in chronic kidney disease: Secondary | ICD-10-CM | POA: Diagnosis not present

## 2017-05-03 DIAGNOSIS — N186 End stage renal disease: Secondary | ICD-10-CM | POA: Diagnosis not present

## 2017-05-03 DIAGNOSIS — N2581 Secondary hyperparathyroidism of renal origin: Secondary | ICD-10-CM | POA: Diagnosis not present

## 2017-05-05 DIAGNOSIS — Z992 Dependence on renal dialysis: Secondary | ICD-10-CM | POA: Diagnosis not present

## 2017-05-05 DIAGNOSIS — D631 Anemia in chronic kidney disease: Secondary | ICD-10-CM | POA: Diagnosis not present

## 2017-05-05 DIAGNOSIS — N2581 Secondary hyperparathyroidism of renal origin: Secondary | ICD-10-CM | POA: Diagnosis not present

## 2017-05-05 DIAGNOSIS — N186 End stage renal disease: Secondary | ICD-10-CM | POA: Diagnosis not present

## 2017-05-05 DIAGNOSIS — Q612 Polycystic kidney, adult type: Secondary | ICD-10-CM | POA: Diagnosis not present

## 2018-01-04 DEATH — deceased
